# Patient Record
Sex: Male | Born: 1955 | Race: Black or African American | Hispanic: No | Smoking: Former smoker
Health system: Southern US, Community
[De-identification: ages and names within clinical notes are randomized; demographics above are authoritative.]

## PROBLEM LIST (undated history)

## (undated) DIAGNOSIS — N289 Disorder of kidney and ureter, unspecified: Secondary | ICD-10-CM

## (undated) DIAGNOSIS — N186 End stage renal disease: Secondary | ICD-10-CM

## (undated) DIAGNOSIS — I10 Essential (primary) hypertension: Secondary | ICD-10-CM

## (undated) DIAGNOSIS — E7800 Pure hypercholesterolemia, unspecified: Secondary | ICD-10-CM

## (undated) DIAGNOSIS — E119 Type 2 diabetes mellitus without complications: Secondary | ICD-10-CM

---

## 2021-11-26 ENCOUNTER — Encounter: Payer: Self-pay | Admitting: Emergency Medicine

## 2021-11-26 ENCOUNTER — Emergency Department: Payer: Medicare PPO

## 2021-11-26 ENCOUNTER — Other Ambulatory Visit: Payer: Self-pay

## 2021-11-26 ENCOUNTER — Inpatient Hospital Stay
Admission: EM | Admit: 2021-11-26 | Discharge: 2021-12-02 | DRG: 492 | Disposition: A | Discharge status: Skilled Nursing Facility | Payer: Medicare PPO | Attending: Internal Medicine | Admitting: Internal Medicine

## 2021-11-26 DIAGNOSIS — I5032 Chronic diastolic (congestive) heart failure: Secondary | ICD-10-CM

## 2021-11-26 DIAGNOSIS — Z79899 Other long term (current) drug therapy: Secondary | ICD-10-CM | POA: Diagnosis not present

## 2021-11-26 DIAGNOSIS — C61 Malignant neoplasm of prostate: Secondary | ICD-10-CM

## 2021-11-26 DIAGNOSIS — M21371 Foot drop, right foot: Secondary | ICD-10-CM | POA: Diagnosis present

## 2021-11-26 DIAGNOSIS — C7951 Secondary malignant neoplasm of bone: Secondary | ICD-10-CM | POA: Diagnosis present

## 2021-11-26 DIAGNOSIS — Z87891 Personal history of nicotine dependence: Secondary | ICD-10-CM

## 2021-11-26 DIAGNOSIS — Z20822 Contact with and (suspected) exposure to covid-19: Secondary | ICD-10-CM | POA: Diagnosis present

## 2021-11-26 DIAGNOSIS — E119 Type 2 diabetes mellitus without complications: Secondary | ICD-10-CM

## 2021-11-26 DIAGNOSIS — I10 Essential (primary) hypertension: Secondary | ICD-10-CM

## 2021-11-26 DIAGNOSIS — Z992 Dependence on renal dialysis: Secondary | ICD-10-CM | POA: Diagnosis not present

## 2021-11-26 DIAGNOSIS — Z794 Long term (current) use of insulin: Secondary | ICD-10-CM

## 2021-11-26 DIAGNOSIS — E876 Hypokalemia: Secondary | ICD-10-CM | POA: Diagnosis present

## 2021-11-26 DIAGNOSIS — S82143A Displaced bicondylar fracture of unspecified tibia, initial encounter for closed fracture: Secondary | ICD-10-CM | POA: Diagnosis present

## 2021-11-26 DIAGNOSIS — Z8249 Family history of ischemic heart disease and other diseases of the circulatory system: Secondary | ICD-10-CM

## 2021-11-26 DIAGNOSIS — Z8711 Personal history of peptic ulcer disease: Secondary | ICD-10-CM

## 2021-11-26 DIAGNOSIS — I5022 Chronic systolic (congestive) heart failure: Secondary | ICD-10-CM | POA: Diagnosis present

## 2021-11-26 DIAGNOSIS — I132 Hypertensive heart and chronic kidney disease with heart failure and with stage 5 chronic kidney disease, or end stage renal disease: Secondary | ICD-10-CM | POA: Diagnosis present

## 2021-11-26 DIAGNOSIS — N2889 Other specified disorders of kidney and ureter: Secondary | ICD-10-CM | POA: Diagnosis present

## 2021-11-26 DIAGNOSIS — Z951 Presence of aortocoronary bypass graft: Secondary | ICD-10-CM

## 2021-11-26 DIAGNOSIS — E1165 Type 2 diabetes mellitus with hyperglycemia: Secondary | ICD-10-CM | POA: Diagnosis present

## 2021-11-26 DIAGNOSIS — N2581 Secondary hyperparathyroidism of renal origin: Secondary | ICD-10-CM | POA: Diagnosis present

## 2021-11-26 DIAGNOSIS — D631 Anemia in chronic kidney disease: Secondary | ICD-10-CM | POA: Diagnosis present

## 2021-11-26 DIAGNOSIS — Y9289 Other specified places as the place of occurrence of the external cause: Secondary | ICD-10-CM | POA: Diagnosis not present

## 2021-11-26 DIAGNOSIS — W010XXA Fall on same level from slipping, tripping and stumbling without subsequent striking against object, initial encounter: Secondary | ICD-10-CM | POA: Diagnosis present

## 2021-11-26 DIAGNOSIS — S82141A Displaced bicondylar fracture of right tibia, initial encounter for closed fracture: Principal | ICD-10-CM

## 2021-11-26 DIAGNOSIS — C772 Secondary and unspecified malignant neoplasm of intra-abdominal lymph nodes: Secondary | ICD-10-CM | POA: Diagnosis present

## 2021-11-26 DIAGNOSIS — N186 End stage renal disease: Secondary | ICD-10-CM

## 2021-11-26 DIAGNOSIS — E7800 Pure hypercholesterolemia, unspecified: Secondary | ICD-10-CM | POA: Diagnosis present

## 2021-11-26 DIAGNOSIS — S82121A Displaced fracture of lateral condyle of right tibia, initial encounter for closed fracture: Secondary | ICD-10-CM | POA: Diagnosis present

## 2021-11-26 DIAGNOSIS — I2510 Atherosclerotic heart disease of native coronary artery without angina pectoris: Secondary | ICD-10-CM | POA: Diagnosis present

## 2021-11-26 DIAGNOSIS — E1122 Type 2 diabetes mellitus with diabetic chronic kidney disease: Secondary | ICD-10-CM | POA: Diagnosis present

## 2021-11-26 HISTORY — DX: Essential (primary) hypertension: I10

## 2021-11-26 HISTORY — DX: Type 2 diabetes mellitus without complications: E11.9

## 2021-11-26 HISTORY — DX: Pure hypercholesterolemia, unspecified: E78.00

## 2021-11-26 HISTORY — DX: End stage renal disease: N18.6

## 2021-11-26 HISTORY — DX: Disorder of kidney and ureter, unspecified: N28.9

## 2021-11-26 LAB — CBC WITH DIFFERENTIAL/PLATELET
Abs Immature Granulocytes: 0.01 10*3/uL (ref 0.00–0.07)
Basophils Absolute: 0 10*3/uL (ref 0.0–0.1)
Basophils Relative: 0 %
Eosinophils Absolute: 0 10*3/uL (ref 0.0–0.5)
Eosinophils Relative: 0 %
HCT: 38.9 % — ABNORMAL LOW (ref 39.0–52.0)
Hemoglobin: 12.2 g/dL — ABNORMAL LOW (ref 13.0–17.0)
Immature Granulocytes: 0 %
Lymphocytes Relative: 16 %
Lymphs Abs: 1 10*3/uL (ref 0.7–4.0)
MCH: 29.2 pg (ref 26.0–34.0)
MCHC: 31.4 g/dL (ref 30.0–36.0)
MCV: 93.1 fL (ref 80.0–100.0)
Monocytes Absolute: 0.3 10*3/uL (ref 0.1–1.0)
Monocytes Relative: 5 %
Neutro Abs: 5 10*3/uL (ref 1.7–7.7)
Neutrophils Relative %: 79 %
Platelets: 128 10*3/uL — ABNORMAL LOW (ref 150–400)
RBC: 4.18 MIL/uL — ABNORMAL LOW (ref 4.22–5.81)
RDW: 14.6 % (ref 11.5–15.5)
WBC: 6.4 10*3/uL (ref 4.0–10.5)
nRBC: 0 % (ref 0.0–0.2)

## 2021-11-26 LAB — COMPREHENSIVE METABOLIC PANEL
ALT: 19 U/L (ref 0–44)
AST: 22 U/L (ref 15–41)
Albumin: 3.4 g/dL — ABNORMAL LOW (ref 3.5–5.0)
Alkaline Phosphatase: 121 U/L (ref 38–126)
Anion gap: 11 (ref 5–15)
BUN: 20 mg/dL (ref 8–23)
CO2: 27 mmol/L (ref 22–32)
Calcium: 8.3 mg/dL — ABNORMAL LOW (ref 8.9–10.3)
Chloride: 101 mmol/L (ref 98–111)
Creatinine, Ser: 3.16 mg/dL — ABNORMAL HIGH (ref 0.61–1.24)
GFR, Estimated: 21 mL/min — ABNORMAL LOW (ref >60)
Glucose, Bld: 109 mg/dL — ABNORMAL HIGH (ref 70–99)
Potassium: 3.1 mmol/L — ABNORMAL LOW (ref 3.5–5.1)
Sodium: 139 mmol/L (ref 135–145)
Total Bilirubin: 0.5 mg/dL (ref 0.3–1.2)
Total Protein: 6.7 g/dL (ref 6.5–8.1)

## 2021-11-26 LAB — RESP PANEL BY RT-PCR (FLU A&B, COVID) ARPGX2
Influenza A by PCR: NEGATIVE
Influenza B by PCR: NEGATIVE
SARS Coronavirus 2 by RT PCR: NEGATIVE

## 2021-11-26 LAB — GLUCOSE, CAPILLARY: Glucose-Capillary: 117 mg/dL — ABNORMAL HIGH (ref 70–99)

## 2021-11-26 MED ORDER — SENNOSIDES-DOCUSATE SODIUM 8.6-50 MG PO TABS
1.0000 | ORAL_TABLET | Freq: Every day | ORAL | Status: DC
  Administered 2021-11-26 – 2021-12-01 (×6): 1 via ORAL
  Filled 2021-11-26 (×6): qty 1

## 2021-11-26 MED ORDER — TAMSULOSIN HCL 0.4 MG PO CAPS
0.4000 mg | ORAL_CAPSULE | Freq: Every day | ORAL | Status: DC
  Administered 2021-11-26 – 2021-12-01 (×6): 0.4 mg via ORAL
  Filled 2021-11-26 (×6): qty 1

## 2021-11-26 MED ORDER — ATORVASTATIN CALCIUM 20 MG PO TABS
80.0000 mg | ORAL_TABLET | Freq: Every day | ORAL | Status: DC
  Administered 2021-11-28 – 2021-12-02 (×5): 80 mg via ORAL
  Filled 2021-11-26 (×5): qty 4

## 2021-11-26 MED ORDER — AMLODIPINE BESYLATE 5 MG PO TABS
5.0000 mg | ORAL_TABLET | Freq: Every day | ORAL | Status: DC
  Administered 2021-11-26 – 2021-12-02 (×7): 5 mg via ORAL
  Filled 2021-11-26 (×7): qty 1

## 2021-11-26 MED ORDER — INSULIN ASPART 100 UNIT/ML IJ SOLN: 0.0000 [IU] | Freq: Every day | INTRAMUSCULAR | Status: DC

## 2021-11-26 MED ORDER — CALCIUM CARBONATE ANTACID 500 MG PO CHEW
500.0000 mg | CHEWABLE_TABLET | Freq: Two times a day (BID) | ORAL | Status: DC
  Administered 2021-11-26 – 2021-12-02 (×11): 500 mg via ORAL
  Filled 2021-11-26 (×11): qty 3

## 2021-11-26 MED ORDER — MORPHINE SULFATE (PF) 4 MG/ML IV SOLN: 4.0000 mg | Freq: Once | INTRAVENOUS | Status: DC

## 2021-11-26 MED ORDER — VITAMIN B-12 1000 MCG PO TABS
500.0000 ug | ORAL_TABLET | Freq: Every morning | ORAL | Status: DC
  Administered 2021-11-28 – 2021-12-02 (×5): 500 ug via ORAL
  Filled 2021-11-26 (×5): qty 1

## 2021-11-26 MED ORDER — PROCHLORPERAZINE EDISYLATE 10 MG/2ML IJ SOLN
10.0000 mg | Freq: Four times a day (QID) | INTRAMUSCULAR | Status: DC | PRN
  Filled 2021-11-26: qty 2

## 2021-11-26 MED ORDER — ADULT MULTIVITAMIN W/MINERALS CH
1.0000 | ORAL_TABLET | Freq: Every evening | ORAL | Status: DC
  Administered 2021-11-26 – 2021-12-01 (×5): 1 via ORAL
  Filled 2021-11-26 (×5): qty 1

## 2021-11-26 MED ORDER — HYDROMORPHONE HCL 1 MG/ML IJ SOLN
1.0000 mg | Freq: Once | INTRAMUSCULAR | Status: AC
  Administered 2021-11-26: 1 mg via INTRAVENOUS
  Filled 2021-11-26: qty 1

## 2021-11-26 MED ORDER — OXYCODONE HCL 5 MG PO TABS
5.0000 mg | ORAL_TABLET | ORAL | Status: DC | PRN
  Administered 2021-11-26 – 2021-12-02 (×14): 5 mg via ORAL
  Filled 2021-11-26 (×14): qty 1

## 2021-11-26 MED ORDER — PANTOPRAZOLE SODIUM 40 MG PO TBEC
40.0000 mg | DELAYED_RELEASE_TABLET | Freq: Two times a day (BID) | ORAL | Status: DC
  Administered 2021-11-26 – 2021-12-02 (×11): 40 mg via ORAL
  Filled 2021-11-26 (×11): qty 1

## 2021-11-26 MED ORDER — INSULIN ASPART 100 UNIT/ML IJ SOLN: 0.0000 [IU] | Freq: Three times a day (TID) | INTRAMUSCULAR | Status: DC

## 2021-11-26 MED ORDER — TRAZODONE HCL 50 MG PO TABS
50.0000 mg | ORAL_TABLET | Freq: Every day | ORAL | Status: DC
  Administered 2021-11-26 – 2021-12-01 (×6): 50 mg via ORAL
  Filled 2021-11-26 (×6): qty 1

## 2021-11-26 MED ORDER — HEPARIN SODIUM (PORCINE) 5000 UNIT/ML IJ SOLN: 5000.0000 [IU] | Freq: Three times a day (TID) | INTRAMUSCULAR | Status: DC

## 2021-11-26 MED ORDER — VITAMIN D (ERGOCALCIFEROL) 1.25 MG (50000 UNIT) PO CAPS
50000.0000 [IU] | ORAL_CAPSULE | ORAL | Status: DC
  Filled 2021-11-26: qty 1

## 2021-11-26 MED ORDER — CEFAZOLIN SODIUM-DEXTROSE 2-4 GM/100ML-% IV SOLN
2.0000 g | INTRAVENOUS | Status: AC
Start: 2021-11-27 — End: 2021-11-27
  Administered 2021-11-27: 2 g via INTRAVENOUS
  Filled 2021-11-26: qty 100

## 2021-11-26 MED ORDER — OXYBUTYNIN CHLORIDE 5 MG PO TABS
5.0000 mg | ORAL_TABLET | Freq: Three times a day (TID) | ORAL | Status: DC
  Administered 2021-11-27 – 2021-12-02 (×14): 5 mg via ORAL
  Filled 2021-11-26 (×20): qty 1

## 2021-11-26 MED ORDER — CARVEDILOL 25 MG PO TABS
25.0000 mg | ORAL_TABLET | Freq: Two times a day (BID) | ORAL | Status: DC
  Administered 2021-11-27 – 2021-12-02 (×9): 25 mg via ORAL
  Filled 2021-11-26 (×9): qty 1

## 2021-11-26 MED ORDER — MELATONIN 5 MG PO TABS
5.0000 mg | ORAL_TABLET | Freq: Every evening | ORAL | Status: DC | PRN
  Administered 2021-11-26 – 2021-11-28 (×2): 5 mg via ORAL
  Filled 2021-11-26 (×3): qty 1

## 2021-11-26 MED ORDER — HYDROMORPHONE HCL 1 MG/ML IJ SOLN
1.0000 mg | INTRAMUSCULAR | Status: DC | PRN
  Administered 2021-11-27 (×4): 1 mg via INTRAVENOUS
  Filled 2021-11-26 (×5): qty 1

## 2021-11-26 MED ORDER — PREDNISONE 10 MG PO TABS
10.0000 mg | ORAL_TABLET | Freq: Every day | ORAL | Status: DC
  Administered 2021-11-28 – 2021-12-02 (×5): 10 mg via ORAL
  Filled 2021-11-26 (×5): qty 1

## 2021-11-26 MED ORDER — ACETAMINOPHEN 325 MG PO TABS
650.0000 mg | ORAL_TABLET | Freq: Once | ORAL | Status: AC
Start: 2021-11-26 — End: 2021-11-26
  Administered 2021-11-26: 650 mg via ORAL
  Filled 2021-11-26: qty 2

## 2021-11-26 MED ORDER — GABAPENTIN 100 MG PO CAPS
100.0000 mg | ORAL_CAPSULE | Freq: Every day | ORAL | Status: DC
  Administered 2021-11-26 – 2021-12-02 (×6): 100 mg via ORAL
  Filled 2021-11-26 (×7): qty 1

## 2021-11-26 MED ORDER — FOLIC ACID 1 MG PO TABS
1.0000 mg | ORAL_TABLET | Freq: Every day | ORAL | Status: DC
  Administered 2021-11-28 – 2021-12-02 (×5): 1 mg via ORAL
  Filled 2021-11-26 (×5): qty 1

## 2021-11-26 MED ORDER — ACETAMINOPHEN 325 MG PO TABS
650.0000 mg | ORAL_TABLET | Freq: Four times a day (QID) | ORAL | Status: DC | PRN
  Administered 2021-11-27 – 2021-11-30 (×4): 650 mg via ORAL
  Filled 2021-11-26 (×4): qty 2

## 2021-11-26 MED ORDER — TORSEMIDE 20 MG PO TABS
20.0000 mg | ORAL_TABLET | Freq: Two times a day (BID) | ORAL | Status: DC
  Administered 2021-11-28 – 2021-12-02 (×9): 20 mg via ORAL
  Filled 2021-11-26 (×9): qty 1

## 2021-11-26 MED ORDER — POLYETHYLENE GLYCOL 3350 17 G PO PACK
17.0000 g | PACK | Freq: Every day | ORAL | Status: DC | PRN
  Administered 2021-12-01: 17 g via ORAL
  Filled 2021-11-26: qty 1

## 2021-11-26 NOTE — Progress Notes (Signed)
Patient's health care power of attorney is his daughter Micheal Aguirre. She said patient has a history of dementia. Th daughter would like to speak to an ortho surgeon before proceeding with the surgery tomorrow. Dr. Mack Guise aware.  ? ?[name] ?[phone] ?([place]) ?

## 2021-11-26 NOTE — ED Notes (Signed)
See triage note  presents s/p fall  having pain to right knee  ?

## 2021-11-26 NOTE — ED Triage Notes (Signed)
Pt to ED via [facility] from [facility]. Pt was at dialysis and had a mechanical fall going to the bathroom. Pt reports right leg pain and swelling. Pt denies CP or SOB. Pt denies LOC or head trauma.  ?

## 2021-11-26 NOTE — H&P (Addendum)
?History and Physical ? ?Micheal Aguirre QPR:[id number] DOB: [birth year] DOA: 11/26/2021 ? ?Referring physician:  Dr. Starleen Blue, [place] ?PCP: Pcp, No  ?Outpatient Specialists: Nephrology, oncology. ?Patient coming from: SNF through dialysis center. ? ?Chief Complaint: R knee pain  ? ?HPI: Micheal Aguirre is a 66 y.o. male with medical history significant for ESRD on HD MWF, essential hypertension, type 2 diabetes, hyperlipidemia, coronary artery disease status post CABG, HFrEF 35%, duodenal ulcers, history of GI bleed, prostate cancer, with metastasis to bone, left renal mass, who presented to [facility] ED via EMS after a mechanical fall at the hemodialysis center today.  Immediately after his fall he incurred right knee pain with significant edema to his right knee.  No head trauma.  He was in his usual state of health prior to this.  Upon presentation to the ED, patient was noted to have significant pain and edema to his right knee.  Right knee x-ray revealed right lateral tibial plateau fracture and joint effusion.  CT scan confirmed the same.  EDP discussed the case with orthopedic surgery Dr. Mack Guise who recommended admission by hospitalist service and possible orthopedic surgical repair on 11/27/2021.  Patient was admitted by the hospitalist service, [facility]. ? ?ED Course: Tmax 98.2.  BP 184/99, pulse 73, respiratory 20, saturation 98% on room air.  Labs studies remarkable for serum potassium 3.1, glucose 109, creatinine 3.16, calcium 8.3, albumin 3.4, GFR 21.  Presenting hemoglobin 12.2 K, platelet count 1 28,000. ? ?Review of Systems: ?Review of systems as noted in the HPI. All other systems reviewed and are negative. ? ? ?Past Medical History:  ?Diagnosis Date  ? Diabetes mellitus without complication ([place])   ? ESRD (end stage renal disease) ([place])   ? Hypercholesteremia   ? Hypertension   ? Renal disorder   ? ?Surgical history: ?Cardiac surgery ?Ureter surgery. ? ?Social History:  reports that he has an unknown smoking status. He has  never used smokeless tobacco. He reports that he does not currently use alcohol. He reports that he does not use drugs. ? ? ?Family history: ?Mother with history of myocardial infarction in her late 64s. ? ?Prior to Admission medications   ?Medication Sig Start Date End Date Taking? Authorizing Provider  ?amLODipine (NORVASC) 5 MG tablet Take 5 mg by mouth daily.   Yes [provider]  ?atorvastatin (LIPITOR) 80 MG tablet Take 80 mg by mouth daily.   Yes [provider]  ?carvedilol (COREG) 25 MG tablet Take 25 mg by mouth 2 (two) times daily with a meal.   Yes [provider]  ?gabapentin (NEURONTIN) 100 MG capsule Take 100 mg by mouth daily.   Yes [provider]  ?insulin lispro (HUMALOG) 100 UNIT/ML injection Inject into the skin 3 (three) times daily before meals.   Yes [provider]  ?oxybutynin (DITROPAN) 5 MG tablet Take 5 mg by mouth 3 (three) times daily.   Yes [provider]  ?traZODone (DESYREL) 50 MG tablet Take 50 mg by mouth at bedtime.   Yes [provider]  ? ? ?Physical Exam: ?BP (!) 184/99   Pulse 73   Temp 98.2 ?F (36.8 ?C) (Oral)   Resp 20   Ht '5\' 9"'$  (1.753 m)   Wt 81.6 kg   SpO2 98%   BMI 26.58 kg/m?  ? ?General: 66 y.o. year-old male well developed well nourished in no acute distress.  Alert and interactive. ?Cardiovascular: Regular rate and rhythm with no rubs or gallops.  No thyromegaly or  JVD noted.  Trace lower extremity edema. 2/4 pulses in all 4 extremities. ?Respiratory: Clear to auscultation with no wheezes or rales. Good inspiratory effort. ?Abdomen: Soft nontender nondistended with normal bowel sounds x4 quadrants. ?Muskuloskeletal: No cyanosis or clubbing.  Trace edema noted bilaterally.  Joint effusion noted from right knee. ?Neuro: CN II-XII intact, strength, sensation, reflexes ?Skin: No ulcerative lesions noted or rashes ?Psychiatry: Judgement and insight appear normal. Mood is appropriate for condition and  setting ?   ?   ?   ?Labs on Admission:  ?Basic Metabolic Panel: ?Recent Labs  ?Lab 11/26/21 ?3267  ?NA 139  ?K 3.1*  ?CL 101  ?CO2 27  ?GLUCOSE 109*  ?BUN 20  ?CREATININE 3.16*  ?CALCIUM 8.3*  ? ?Liver Function Tests: ?Recent Labs  ?Lab 11/26/21 ?1245  ?AST 22  ?ALT 19  ?ALKPHOS 121  ?BILITOT 0.5  ?PROT 6.7  ?ALBUMIN 3.4*  ? ?No results for input(s): LIPASE, AMYLASE in the last 168 hours. ?No results for input(s): AMMONIA in the last 168 hours. ?CBC: ?Recent Labs  ?Lab 11/26/21 ?8099  ?WBC 6.4  ?NEUTROABS 5.0  ?HGB 12.2*  ?HCT 38.9*  ?MCV 93.1  ?PLT 128*  ? ?Cardiac Enzymes: ?No results for input(s): CKTOTAL, CKMB, CKMBINDEX, TROPONINI in the last 168 hours. ? ?BNP (last 3 results) ?No results for input(s): BNP in the last 8760 hours. ? ?ProBNP (last 3 results) ?No results for input(s): PROBNP in the last 8760 hours. ? ?CBG: ?No results for input(s): GLUCAP in the last 168 hours. ? ?Radiological Exams on Admission: ?DG Ankle Complete Right ? ?Result Date: 11/26/2021 ?CLINICAL DATA:  Trauma, pain EXAM: RIGHT ANKLE - COMPLETE 3+ VIEW COMPARISON:  None. FINDINGS: No recent fracture or dislocation is seen. Osteopenia is seen in bony structures. Small bony spurs seen in the distal tibia. Bony spurs are noted in the dorsal aspect of intertarsal and tarsometatarsal joints in the lateral view. There is possible tiny plantar spur in the calcaneus. There is soft tissue swelling around the ankle, more so over the lateral malleolus. IMPRESSION: No recent fracture or dislocation is seen in the right ankle. Degenerative changes are noted in multiple joints as described in the body of the report. There is possible small plantar spur in the calcaneus. Electronically Signed   By: Elmer Picker M.D.   On: 11/26/2021 17:59  ? ?CT Cervical Spine Wo Contrast ? ?Result Date: 11/26/2021 ?CLINICAL DATA:  Neck trauma EXAM: CT CERVICAL SPINE WITHOUT CONTRAST TECHNIQUE: Multidetector CT imaging of the cervical spine was performed without  intravenous contrast. Multiplanar CT image reconstructions were also generated. RADIATION DOSE REDUCTION: This exam was performed according to the departmental dose-optimization program which includes automated exposure control, adjustment of the mA and/or kV according to patient size and/or use of iterative reconstruction technique. COMPARISON:  None. FINDINGS: Alignment: Straightening and mild reversal of the normal cervical lordosis, with trace anterolisthesis of C4 on C5 and trace retrolisthesis of C5 on C6, which appears degenerative. Skull base and vertebrae: No acute fracture. No primary bone lesion or focal pathologic process. Osteopenia. Soft tissues and spinal canal: No prevertebral fluid or swelling. No visible canal hematoma. Vascular calcifications. Disc levels: Multilevel degenerative changes, with mild spinal canal stenosis at C4-C5 and C5-C6. Multilevel uncovertebral and facet arthropathy, which causes severe left neural foraminal narrowing at C4-C5 and moderate to severe neural foraminal narrowing on the right at C5-C6. Upper chest: No focal pulmonary opacity or pleural effusion. Other: None. IMPRESSION: No acute fracture or traumatic listhesis  in the cervical spine. Electronically Signed   By: Merilyn Baba M.D.   On: 11/26/2021 19:10  ? ?CT Knee Right Wo Contrast ? ?Result Date: 11/26/2021 ?CLINICAL DATA:  Knee trauma.  Tibial plateau fracture. EXAM: CT OF THE RIGHT KNEE WITHOUT CONTRAST 3-DIMENSIONAL CT IMAGE RENDERING ON ACQUISITION WORKSTATION TECHNIQUE: Multidetector CT imaging of the right knee was performed according to the standard protocol. Multiplanar CT image reconstructions were also generated. 3-dimensional CT images were rendered by post-processing of the original CT data on an acquisition workstation. The 3-dimensional CT images were interpreted and findings were reported in the accompanying complete CT report for this study RADIATION DOSE REDUCTION: This exam was performed according  to the departmental dose-optimization program which includes automated exposure control, adjustment of the mA and/or kV according to patient size and/or use of iterative reconstruction technique. COMPARISON:  R

## 2021-11-26 NOTE — ED Provider Notes (Addendum)
? ?[facility] ?Provider Note ? ? ? Event Date/Time  ? First MD Initiated Contact with Patient 11/26/21 1627   ?  (approximate) ? ? ?History  ? ?Fall ? ? ?HPI ? ?Micheal Aguirre is a 66 y.o. male with past medical history of end-stage renal disease who gets dialysis Monday, Wednesday and Friday, metastatic prostate cancer coronary disease status post CABG, heart failure with reduced ejection fraction history of peptic ulcer disease and GI bleeding who presents with right knee pain.  Patient was at dialysis when he stepped wrong twisting the leg and falling to the ground to his buttock.  Did not hit his head.  Denies loss of consciousness.  Patient has had significant right knee pain and swelling since.  Unable to ambulate.    ? ?Past Medical History:  ?Diagnosis Date  ? ESRD (end stage renal disease) ([place])   ? Renal disorder   ? ? ?There are no problems to display for this patient. ? ? ? ?Physical Exam  ?Triage Vital Signs: ?ED Triage Vitals  ?Enc Vitals Group  ?   BP 11/26/21 1615 (!) 186/116  ?   Pulse Rate 11/26/21 1615 65  ?   Resp 11/26/21 1615 18  ?   Temp 11/26/21 1615 97.9 ?F (36.6 ?C)  ?   Temp Source 11/26/21 1615 Oral  ?   SpO2 11/26/21 1615 100 %  ?   Weight 11/26/21 1610 180 lb (81.6 kg)  ?   Height 11/26/21 1610 '5\' 9"'$  (1.753 m)  ?   Head Circumference --   ?   Peak Flow --   ?   Pain Score 11/26/21 1535 8  ?   Pain Loc --   ?   Pain Edu? --   ?   Excl. in [place]? --   ? ? ?Most recent vital signs: ?Vitals:  ? 11/26/21 1615  ?BP: (!) 186/116  ?Pulse: 65  ?Resp: 18  ?Temp: 97.9 ?F (36.6 ?C)  ?SpO2: 100%  ? ? ? ?General: Awake, no distress.  ?CV:  Good peripheral perfusion.  ?Resp:  Normal effort.  ?Abd:  No distention.  ?Neuro:             Awake, Alert, Oriented x 3  ?Other:  Right knee is significantly swollen, held in flexion, unable to fully straighten, mild edema of the lower calf but compartments are soft ?2+ DP pulses bilaterally ?Able to move toes but strength testing otherwise limited  secondary to pain ?Sensation grossly intact bilateral lower extremities ? ? ? ?ED Results / Procedures / Treatments  ?Labs ?(all labs ordered are listed, but only abnormal results are displayed) ?Labs Reviewed  ?RESP PANEL BY RT-PCR (FLU A&B, COVID) [id number]  ?CBC WITH DIFFERENTIAL/PLATELET  ?COMPREHENSIVE METABOLIC PANEL  ? ? ? ?EKG ? ? ? ? ?RADIOLOGY ?Reviewed the x-ray of the right knee which shows a depressed lateral tibial plateau fracture ? ? ?PROCEDURES: ? ?Critical Care performed: No ? ?Procedures ? ? ?MEDICATIONS ORDERED IN ED: ?Medications  ?HYDROmorphone (DILAUDID) injection 1 mg (has no administration in time range)  ?acetaminophen (TYLENOL) tablet 650 mg (650 mg Oral Given 11/26/21 1614)  ? ? ? ?IMPRESSION / MDM / ASSESSMENT AND PLAN / ED COURSE  ?I reviewed the triage vital signs and the nursing notes. ?             ?               ? ?Patient is a 66 year old male  with multiple comorbidities presents with knee pain and swelling after a fall today.  Was at dialysis when he seemed to stepped wrong falling onto his buttock and twisting the right leg.  Has had significant right knee pain and swelling since.  On exam he has significant swelling but the compartments are soft he is neurovascularly intact with good pulses.  Does have some tenderness over the ankle as well as will obtain an x-ray of this.  X-ray of the right knee shows a depressed lateral tibial plateau fracture.  We will obtain a CT.  Discussed with Dr. Mack Guise with orthopedics, will touch base after CT is completed.  Patient in significant pain at this time we will treat with IV Dilaudid check labs as he will need admission.  ? ?Labs overall reassuring.  Mildly hypokalemic at 3.1 but given he has end-stage renal disease will not supplement.  CT performed and reviewed by Dr. Mack Guise is okay with the patient staying at [facility] but would like him to be admitted to the hospital service given all of his medical comorbidities.  On reassessment  patient continues to have significant pain, good sensory and pulse exam but is having difficulty dorsiflexing the foot.  Unclear if this is secondary to pain.  Will discuss with the hospitalist for admission. ? ? ?FINAL CLINICAL IMPRESSION(S) / ED DIAGNOSES  ? ?Final diagnoses:  ?Closed fracture of right tibial plateau, initial encounter  ? ? ? ?Rx / DC Orders  ? ?ED Discharge Orders   ? ? None  ? ?  ? ? ? ?Note:  This document was prepared using Dragon voice recognition software and may include unintentional dictation errors. ?  ?Rada Hay, MD ?11/26/21 1730 ? ?  ?Rada Hay, MD ?11/26/21 1919 ? ?

## 2021-11-26 NOTE — ED Provider Triage Note (Signed)
Emergency Medicine Provider Triage Evaluation Note ? ?Micheal Aguirre , a 66 y.o. male  was evaluated in triage.  Pt complains of right knee pain.  Patient states that he slipped and fell at dialysis landed on his knee.  Patient is keeping the leg and flexion at this time.  No other injury or complaint.  Did not hit his head or lose consciousness..  Patient fell at dialysis but was able to complete his treatment. ? ?Review of Systems  ?Positive: Right knee pain/injury ?Negative: Head injury, chest pain, shortness of breath ? ?Physical Exam  ?There were no vitals taken for this visit. ?Gen:   Awake, no distress   ?Resp:  Normal effort  ?MSK:   Unable to extend right leg at this time.  Tender globally along the anterior knee. ?Other:   ? ?Medical Decision Making  ?Medically screening exam initiated at 3:42 PM.  Appropriate orders placed.  [name] was informed that the remainder of the evaluation will be completed by another provider, this initial triage assessment does not replace that evaluation, and the importance of remaining in the ED until their evaluation is complete. ? ?Patient fell, landed on his knee.  Complaining of knee pain.  X-ray ordered. ?  ?[name], PA-C ?11/26/21 1544 ? ?

## 2021-11-26 NOTE — ED Triage Notes (Addendum)
BIB [facility].  Mechanical fall in [place] at dialysis.  Right knee swelling appreciated by EMS per report.  Dialysis treatment completed.  VS wnl ? ?50 mcg fentanyl given IM by EMS ?

## 2021-11-27 ENCOUNTER — Inpatient Hospital Stay: Payer: Medicare PPO

## 2021-11-27 ENCOUNTER — Other Ambulatory Visit: Payer: Self-pay

## 2021-11-27 ENCOUNTER — Inpatient Hospital Stay: Payer: Medicare PPO | Admitting: Anesthesiology

## 2021-11-27 ENCOUNTER — Encounter
Admission: EM | Disposition: A | Discharge status: Skilled Nursing Facility | Payer: Self-pay | Source: Home / Self Care | Attending: Internal Medicine

## 2021-11-27 HISTORY — PX: ORIF TIBIA FRACTURE: SHX5416

## 2021-11-27 LAB — CBC
HCT: 38.4 % — ABNORMAL LOW (ref 39.0–52.0)
Hemoglobin: 12.1 g/dL — ABNORMAL LOW (ref 13.0–17.0)
MCH: 28.9 pg (ref 26.0–34.0)
MCHC: 31.5 g/dL (ref 30.0–36.0)
MCV: 91.6 fL (ref 80.0–100.0)
Platelets: 127 10*3/uL — ABNORMAL LOW (ref 150–400)
RBC: 4.19 MIL/uL — ABNORMAL LOW (ref 4.22–5.81)
RDW: 14.7 % (ref 11.5–15.5)
WBC: 6.1 10*3/uL (ref 4.0–10.5)
nRBC: 0 % (ref 0.0–0.2)

## 2021-11-27 LAB — COMPREHENSIVE METABOLIC PANEL
ALT: 18 U/L (ref 0–44)
AST: 20 U/L (ref 15–41)
Albumin: 3.4 g/dL — ABNORMAL LOW (ref 3.5–5.0)
Alkaline Phosphatase: 116 U/L (ref 38–126)
Anion gap: 10 (ref 5–15)
BUN: 26 mg/dL — ABNORMAL HIGH (ref 8–23)
CO2: 26 mmol/L (ref 22–32)
Calcium: 8.2 mg/dL — ABNORMAL LOW (ref 8.9–10.3)
Chloride: 102 mmol/L (ref 98–111)
Creatinine, Ser: 3.91 mg/dL — ABNORMAL HIGH (ref 0.61–1.24)
GFR, Estimated: 16 mL/min — ABNORMAL LOW (ref >60)
Glucose, Bld: 113 mg/dL — ABNORMAL HIGH (ref 70–99)
Potassium: 2.8 mmol/L — ABNORMAL LOW (ref 3.5–5.1)
Sodium: 138 mmol/L (ref 135–145)
Total Bilirubin: 0.6 mg/dL (ref 0.3–1.2)
Total Protein: 7 g/dL (ref 6.5–8.1)

## 2021-11-27 LAB — BASIC METABOLIC PANEL
Anion gap: 10 (ref 5–15)
BUN: 31 mg/dL — ABNORMAL HIGH (ref 8–23)
CO2: 26 mmol/L (ref 22–32)
Calcium: 8.4 mg/dL — ABNORMAL LOW (ref 8.9–10.3)
Chloride: 105 mmol/L (ref 98–111)
Creatinine, Ser: 4.1 mg/dL — ABNORMAL HIGH (ref 0.61–1.24)
GFR, Estimated: 15 mL/min — ABNORMAL LOW (ref >60)
Glucose, Bld: 123 mg/dL — ABNORMAL HIGH (ref 70–99)
Potassium: 3.2 mmol/L — ABNORMAL LOW (ref 3.5–5.1)
Sodium: 141 mmol/L (ref 135–145)

## 2021-11-27 LAB — URINALYSIS, COMPLETE (UACMP) WITH MICROSCOPIC
Bilirubin Urine: NEGATIVE
Glucose, UA: NEGATIVE mg/dL
Ketones, ur: NEGATIVE mg/dL
Leukocytes,Ua: NEGATIVE
Nitrite: NEGATIVE
Protein, ur: 100 mg/dL — AB
Specific Gravity, Urine: 1.017 (ref 1.005–1.030)
Squamous Epithelial / HPF: NONE SEEN (ref 0–5)
pH: 5 (ref 5.0–8.0)

## 2021-11-27 LAB — HEMOGLOBIN A1C
Hgb A1c MFr Bld: 5.6 % (ref 4.8–5.6)
Mean Plasma Glucose: 114.02 mg/dL

## 2021-11-27 LAB — PROTIME-INR
INR: 1.2 (ref 0.8–1.2)
Prothrombin Time: 14.6 seconds (ref 11.4–15.2)

## 2021-11-27 LAB — TYPE AND SCREEN
ABO/RH(D): O POS
Antibody Screen: NEGATIVE

## 2021-11-27 LAB — SURGICAL PCR SCREEN
MRSA, PCR: NEGATIVE
Staphylococcus aureus: NEGATIVE

## 2021-11-27 LAB — MAGNESIUM: Magnesium: 1.9 mg/dL (ref 1.7–2.4)

## 2021-11-27 LAB — GLUCOSE, CAPILLARY
Glucose-Capillary: 114 mg/dL — ABNORMAL HIGH (ref 70–99)
Glucose-Capillary: 111 mg/dL — ABNORMAL HIGH (ref 70–99)
Glucose-Capillary: 100 mg/dL — ABNORMAL HIGH (ref 70–99)
Glucose-Capillary: 123 mg/dL — ABNORMAL HIGH (ref 70–99)

## 2021-11-27 LAB — HIV ANTIBODY (ROUTINE TESTING W REFLEX): HIV Screen 4th Generation wRfx: NONREACTIVE

## 2021-11-27 LAB — PHOSPHORUS: Phosphorus: 4 mg/dL (ref 2.5–4.6)

## 2021-11-27 SURGERY — OPEN REDUCTION INTERNAL FIXATION (ORIF) TIBIA FRACTURE
Anesthesia: General | Site: Leg Lower | Laterality: Right

## 2021-11-27 MED ORDER — CEFAZOLIN SODIUM-DEXTROSE 2-4 GM/100ML-% IV SOLN
INTRAVENOUS | Status: AC
  Administered 2021-11-27: 2 g via INTRAVENOUS
  Filled 2021-11-27: qty 100

## 2021-11-27 MED ORDER — BISACODYL 10 MG RE SUPP: 10.0000 mg | Freq: Every day | RECTAL | Status: DC | PRN

## 2021-11-27 MED ORDER — PHENYLEPHRINE HCL (PRESSORS) 10 MG/ML IV SOLN
INTRAVENOUS | Status: AC
  Filled 2021-11-27: qty 1

## 2021-11-27 MED ORDER — FENTANYL CITRATE (PF) 100 MCG/2ML IJ SOLN
INTRAMUSCULAR | Status: AC
  Filled 2021-11-27: qty 2

## 2021-11-27 MED ORDER — HEPARIN SODIUM (PORCINE) 5000 UNIT/ML IJ SOLN
5000.0000 [IU] | Freq: Three times a day (TID) | INTRAMUSCULAR | Status: DC
  Administered 2021-11-28 – 2021-11-29 (×5): 5000 [IU] via SUBCUTANEOUS
  Filled 2021-11-27 (×5): qty 1

## 2021-11-27 MED ORDER — EPHEDRINE 5 MG/ML INJ
INTRAVENOUS | Status: AC
  Filled 2021-11-27: qty 5

## 2021-11-27 MED ORDER — POTASSIUM CHLORIDE CRYS ER 20 MEQ PO TBCR: 40.0000 meq | EXTENDED_RELEASE_TABLET | Freq: Once | ORAL | Status: DC

## 2021-11-27 MED ORDER — LIDOCAINE HCL (CARDIAC) PF 100 MG/5ML IV SOSY
PREFILLED_SYRINGE | INTRAVENOUS | Status: DC | PRN
  Administered 2021-11-27: 80 mg via INTRAVENOUS

## 2021-11-27 MED ORDER — CEFAZOLIN SODIUM-DEXTROSE 2-4 GM/100ML-% IV SOLN
2.0000 g | Freq: Four times a day (QID) | INTRAVENOUS | Status: AC
  Administered 2021-11-28: 2 g via INTRAVENOUS
  Filled 2021-11-27 (×2): qty 100

## 2021-11-27 MED ORDER — FENTANYL CITRATE (PF) 100 MCG/2ML IJ SOLN: 25.0000 ug | INTRAMUSCULAR | Status: DC | PRN

## 2021-11-27 MED ORDER — HYDROMORPHONE HCL 2 MG PO TABS: 2.0000 mg | ORAL_TABLET | ORAL | Status: DC | PRN

## 2021-11-27 MED ORDER — FENTANYL CITRATE (PF) 100 MCG/2ML IJ SOLN
INTRAMUSCULAR | Status: DC | PRN
  Administered 2021-11-27 (×2): 50 ug via INTRAVENOUS

## 2021-11-27 MED ORDER — ONDANSETRON HCL 4 MG/2ML IJ SOLN
INTRAMUSCULAR | Status: DC | PRN
  Administered 2021-11-27: 4 mg via INTRAVENOUS

## 2021-11-27 MED ORDER — HYDROMORPHONE HCL 2 MG PO TABS
1.0000 mg | ORAL_TABLET | ORAL | Status: DC | PRN
  Administered 2021-11-27: 2 mg via ORAL
  Filled 2021-11-27: qty 1

## 2021-11-27 MED ORDER — POTASSIUM CHLORIDE 10 MEQ/100ML IV SOLN
10.0000 meq | INTRAVENOUS | Status: AC
  Administered 2021-11-27 (×3): 10 meq via INTRAVENOUS

## 2021-11-27 MED ORDER — HYDROMORPHONE HCL 1 MG/ML IJ SOLN
0.5000 mg | INTRAMUSCULAR | Status: DC | PRN
  Administered 2021-12-01 – 2021-12-02 (×4): 1 mg via INTRAVENOUS
  Filled 2021-11-27 (×4): qty 1

## 2021-11-27 MED ORDER — OXYCODONE HCL 5 MG PO TABS
5.0000 mg | ORAL_TABLET | ORAL | Status: DC | PRN
  Filled 2021-11-27: qty 2

## 2021-11-27 MED ORDER — CALCIUM GLUCONATE-NACL 2-0.675 GM/100ML-% IV SOLN
2.0000 g | Freq: Once | INTRAVENOUS | Status: AC
  Administered 2021-11-27: 2000 mg via INTRAVENOUS
  Filled 2021-11-27: qty 100

## 2021-11-27 MED ORDER — PROPOFOL 10 MG/ML IV BOLUS
INTRAVENOUS | Status: AC
  Filled 2021-11-27: qty 20

## 2021-11-27 MED ORDER — CALCIUM GLUCONATE-NACL 2-0.675 GM/100ML-% IV SOLN
2.0000 g | Freq: Once | INTRAVENOUS | Status: DC
  Administered 2021-11-27: 2 g via INTRAVENOUS
  Filled 2021-11-27: qty 100

## 2021-11-27 MED ORDER — POTASSIUM CHLORIDE 10 MEQ/100ML IV SOLN
10.0000 meq | INTRAVENOUS | Status: DC
  Filled 2021-11-27: qty 100

## 2021-11-27 MED ORDER — 0.9 % SODIUM CHLORIDE (POUR BTL) OPTIME
TOPICAL | Status: DC | PRN
  Administered 2021-11-27: 1000 mL

## 2021-11-27 MED ORDER — LIDOCAINE HCL (PF) 2 % IJ SOLN
INTRAMUSCULAR | Status: AC
  Filled 2021-11-27: qty 5

## 2021-11-27 MED ORDER — LACTATED RINGERS IV SOLN: INTRAVENOUS | Status: DC | PRN

## 2021-11-27 MED ORDER — OXYCODONE HCL 5 MG/5ML PO SOLN: 5.0000 mg | Freq: Once | ORAL | Status: DC | PRN

## 2021-11-27 MED ORDER — PHENOL 1.4 % MT LIQD: 1.0000 | OROMUCOSAL | Status: DC | PRN

## 2021-11-27 MED ORDER — POTASSIUM CHLORIDE CRYS ER 20 MEQ PO TBCR
40.0000 meq | EXTENDED_RELEASE_TABLET | Freq: Once | ORAL | Status: AC
  Administered 2021-11-27: 40 meq via ORAL
  Filled 2021-11-27: qty 2

## 2021-11-27 MED ORDER — ONDANSETRON HCL 4 MG/2ML IJ SOLN
INTRAMUSCULAR | Status: AC
  Filled 2021-11-27: qty 2

## 2021-11-27 MED ORDER — METHOCARBAMOL 500 MG PO TABS
500.0000 mg | ORAL_TABLET | Freq: Four times a day (QID) | ORAL | Status: DC | PRN
  Administered 2021-11-28 – 2021-12-02 (×5): 500 mg via ORAL
  Filled 2021-11-27 (×5): qty 1

## 2021-11-27 MED ORDER — TRAMADOL HCL 50 MG PO TABS
50.0000 mg | ORAL_TABLET | Freq: Four times a day (QID) | ORAL | Status: DC
  Administered 2021-11-27 – 2021-11-28 (×4): 50 mg via ORAL
  Filled 2021-11-27 (×4): qty 1

## 2021-11-27 MED ORDER — DOCUSATE SODIUM 100 MG PO CAPS
100.0000 mg | ORAL_CAPSULE | Freq: Two times a day (BID) | ORAL | Status: DC
  Administered 2021-11-27 – 2021-12-02 (×10): 100 mg via ORAL
  Filled 2021-11-27 (×10): qty 1

## 2021-11-27 MED ORDER — ACETAMINOPHEN 500 MG PO TABS
1000.0000 mg | ORAL_TABLET | Freq: Four times a day (QID) | ORAL | Status: AC
  Administered 2021-11-27 – 2021-11-28 (×3): 1000 mg via ORAL
  Filled 2021-11-27 (×3): qty 2

## 2021-11-27 MED ORDER — ONDANSETRON HCL 4 MG/2ML IJ SOLN: 4.0000 mg | Freq: Four times a day (QID) | INTRAMUSCULAR | Status: DC | PRN

## 2021-11-27 MED ORDER — ALUM & MAG HYDROXIDE-SIMETH 200-200-20 MG/5ML PO SUSP
30.0000 mL | ORAL | Status: DC | PRN
  Administered 2021-12-02: 30 mL via ORAL
  Filled 2021-11-27: qty 30

## 2021-11-27 MED ORDER — ONDANSETRON HCL 4 MG PO TABS: 4.0000 mg | ORAL_TABLET | Freq: Four times a day (QID) | ORAL | Status: DC | PRN

## 2021-11-27 MED ORDER — PHENYLEPHRINE HCL-NACL 20-0.9 MG/250ML-% IV SOLN
INTRAVENOUS | Status: AC
  Filled 2021-11-27: qty 250

## 2021-11-27 MED ORDER — OXYCODONE HCL 5 MG PO TABS
10.0000 mg | ORAL_TABLET | ORAL | Status: DC | PRN
  Administered 2021-11-27: 10 mg via ORAL
  Administered 2021-11-28: 15 mg via ORAL
  Filled 2021-11-27: qty 3

## 2021-11-27 MED ORDER — MENTHOL 3 MG MT LOZG
1.0000 | LOZENGE | OROMUCOSAL | Status: DC | PRN
  Filled 2021-11-27: qty 9

## 2021-11-27 MED ORDER — NEOMYCIN-POLYMYXIN B GU 40-200000 IR SOLN
Status: DC | PRN
  Administered 2021-11-27: 4 mL

## 2021-11-27 MED ORDER — ACETAMINOPHEN 10 MG/ML IV SOLN
INTRAVENOUS | Status: AC
Start: 2021-11-27 — End: ?
  Filled 2021-11-27: qty 100

## 2021-11-27 MED ORDER — ACETAMINOPHEN 10 MG/ML IV SOLN
INTRAVENOUS | Status: DC | PRN
  Administered 2021-11-27: 1000 mg via INTRAVENOUS

## 2021-11-27 MED ORDER — EPHEDRINE SULFATE (PRESSORS) 50 MG/ML IJ SOLN
INTRAMUSCULAR | Status: DC | PRN
Start: 2021-11-27 — End: 2021-11-27
  Administered 2021-11-27: 10 mg via INTRAVENOUS
  Administered 2021-11-27: 5 mg via INTRAVENOUS
  Administered 2021-11-27: 10 mg via INTRAVENOUS

## 2021-11-27 MED ORDER — PHENYLEPHRINE 40 MCG/ML (10ML) SYRINGE FOR IV PUSH (FOR BLOOD PRESSURE SUPPORT)
PREFILLED_SYRINGE | INTRAVENOUS | Status: DC | PRN
Start: 2021-11-27 — End: 2021-11-27
  Administered 2021-11-27 (×3): 100 ug via INTRAVENOUS
  Administered 2021-11-27: 200 ug via INTRAVENOUS

## 2021-11-27 MED ORDER — POTASSIUM CHLORIDE CRYS ER 20 MEQ PO TBCR
20.0000 meq | EXTENDED_RELEASE_TABLET | Freq: Once | ORAL | Status: AC
  Administered 2021-11-27: 20 meq via ORAL
  Filled 2021-11-27: qty 1

## 2021-11-27 MED ORDER — ONDANSETRON HCL 4 MG/2ML IJ SOLN: 4.0000 mg | Freq: Once | INTRAMUSCULAR | Status: DC | PRN

## 2021-11-27 MED ORDER — SODIUM CHLORIDE 0.9 % IV SOLN
75.0000 mL/h | INTRAVENOUS | Status: DC
  Administered 2021-11-27: 75 mL/h via INTRAVENOUS

## 2021-11-27 MED ORDER — PHENYLEPHRINE HCL-NACL 20-0.9 MG/250ML-% IV SOLN
INTRAVENOUS | Status: DC | PRN
  Administered 2021-11-27: 40 ug/min via INTRAVENOUS

## 2021-11-27 MED ORDER — METHOCARBAMOL 1000 MG/10ML IJ SOLN
500.0000 mg | Freq: Four times a day (QID) | INTRAVENOUS | Status: DC | PRN
  Filled 2021-11-27: qty 5

## 2021-11-27 MED ORDER — POTASSIUM CHLORIDE CRYS ER 20 MEQ PO TBCR: 20.0000 meq | EXTENDED_RELEASE_TABLET | ORAL | Status: DC

## 2021-11-27 MED ORDER — PROPOFOL 10 MG/ML IV BOLUS
INTRAVENOUS | Status: DC | PRN
  Administered 2021-11-27: 80 mg via INTRAVENOUS

## 2021-11-27 MED ORDER — OXYCODONE HCL 5 MG PO TABS: 5.0000 mg | ORAL_TABLET | Freq: Once | ORAL | Status: DC | PRN

## 2021-11-27 SURGICAL SUPPLY — 67 items
APL PRP STRL LF DISP 70% ISPRP (MISCELLANEOUS) ×1
BIT DRILL 100X2.5XANTM LCK (BIT) IMPLANT
BIT DRILL 2.5X2.75 QC CALB (BIT) ×1 IMPLANT
BIT DRL 100X2.5XANTM LCK (BIT) ×2
BLADE MED AGGRESSIVE (BLADE) ×1 IMPLANT
BNDG ELASTIC 6X5.8 VLCR STR LF (GAUZE/BANDAGES/DRESSINGS) ×1 IMPLANT
BONE CANC CHIPS 20CC PCAN1/4 (Bone Implant) ×2 IMPLANT
CHIPS CANC BONE 20CC PCAN1/4 (Bone Implant) ×1 IMPLANT
CHLORAPREP W/TINT 26 (MISCELLANEOUS) ×2 IMPLANT
CUFF TOURN SGL QUICK 24 (TOURNIQUET CUFF)
CUFF TOURN SGL QUICK 34 (TOURNIQUET CUFF)
CUFF TRNQT CYL 24X4X16.5-23 (TOURNIQUET CUFF) IMPLANT
CUFF TRNQT CYL 34X4.125X (TOURNIQUET CUFF) IMPLANT
DRAPE 3/4 80X56 (DRAPES) ×2 IMPLANT
DRAPE C-ARM XRAY 36X54 (DRAPES) ×2 IMPLANT
DRAPE C-ARMOR (DRAPES) ×2 IMPLANT
DRILL BIT 2.5MM (BIT) ×5
DRSG AQUACEL AG ADV 3.5X10 (GAUZE/BANDAGES/DRESSINGS) ×1 IMPLANT
DRSG EMULSION OIL 3X8 NADH (GAUZE/BANDAGES/DRESSINGS) ×2 IMPLANT
ELECT CAUTERY BLADE 6.4 (BLADE) ×2 IMPLANT
ELECT REM PT RETURN 9FT ADLT (ELECTROSURGICAL) ×2
ELECTRODE REM PT RTRN 9FT ADLT (ELECTROSURGICAL) ×1 IMPLANT
GAUZE SPONGE 4X4 12PLY STRL (GAUZE/BANDAGES/DRESSINGS) ×2 IMPLANT
GLOVE SURG SYN 9.0  PF PI (GLOVE) ×1
GLOVE SURG SYN 9.0 PF PI (GLOVE) ×1 IMPLANT
GOWN SRG 2XL LVL 4 RGLN SLV (GOWNS) ×1 IMPLANT
GOWN STRL NON-REIN 2XL LVL4 (GOWNS) ×2
GOWN STRL REUS W/ TWL LRG LVL3 (GOWN DISPOSABLE) ×1 IMPLANT
GOWN STRL REUS W/TWL LRG LVL3 (GOWN DISPOSABLE) ×2
GRAFT BNE CANC CHIPS 1-8 20CC (Bone Implant) IMPLANT
HEMOVAC 400CC 10FR (MISCELLANEOUS) ×2 IMPLANT
IMMOB KNEE 24 THIGH 24 443303 (SOFTGOODS) ×1 IMPLANT
K-WIRE ACE 1.6X6 (WIRE) ×6
KIT TURNOVER KIT A (KITS) ×2 IMPLANT
KWIRE ACE 1.6X6 (WIRE) IMPLANT
MANIFOLD NEPTUNE II (INSTRUMENTS) ×2 IMPLANT
NDL FILTER BLUNT 18X1 1/2 (NEEDLE) ×1 IMPLANT
NEEDLE FILTER BLUNT 18X 1/2SAF (NEEDLE) ×1
NEEDLE FILTER BLUNT 18X1 1/2 (NEEDLE) ×1 IMPLANT
NS IRRIG 1000ML POUR BTL (IV SOLUTION) ×2 IMPLANT
PACK TOTAL KNEE (MISCELLANEOUS) ×2 IMPLANT
PAD PREP 24X41 OB/GYN DISP (PERSONAL CARE ITEMS) ×2 IMPLANT
PADDING CAST ABS 4INX4YD NS (CAST SUPPLIES) ×1
PADDING CAST ABS COTTON 4X4 ST (CAST SUPPLIES) IMPLANT
PLATE LOCK 5H STD RT PROX TIB (Plate) ×1 IMPLANT
SCALPEL PROTECTED #10 DISP (BLADE) ×4 IMPLANT
SCREW CORTICAL 3.5MM  42MM (Screw) ×1 IMPLANT
SCREW CORTICAL 3.5MM  44MM (Screw) ×1 IMPLANT
SCREW CORTICAL 3.5MM 40MM (Screw) ×1 IMPLANT
SCREW CORTICAL 3.5MM 42MM (Screw) IMPLANT
SCREW CORTICAL 3.5MM 44MM (Screw) IMPLANT
SCREW LOCK CORT STAR 3.5X60 (Screw) ×1 IMPLANT
SCREW LOCK CORT STAR 3.5X70 (Screw) ×1 IMPLANT
SCREW LOCK CORT STAR 3.5X75 (Screw) ×1 IMPLANT
SCREW LOCK CORT STAR 3.5X80 (Screw) ×3 IMPLANT
SCREW LP 3.5X70MM (Screw) ×1 IMPLANT
SCREW LP 3.5X85MM (Screw) ×1 IMPLANT
STAPLER SKIN PROX 35W (STAPLE) ×2 IMPLANT
STRAP SAFETY 5IN WIDE (MISCELLANEOUS) ×2 IMPLANT
SUT VIC AB 0 CT1 36 (SUTURE) ×1 IMPLANT
SUT VIC AB 2-0 SH 27 (SUTURE) ×2
SUT VIC AB 2-0 SH 27XBRD (SUTURE) ×1 IMPLANT
SUT VICRYL 1-0 27IN ABS (SUTURE) ×2
SUTURE VICRYL 1-0 27IN ABS (SUTURE) ×1 IMPLANT
SYR 5ML LL (SYRINGE) ×2 IMPLANT
TRAY FOLEY MTR SLVR 16FR STAT (SET/KITS/TRAYS/PACK) ×2 IMPLANT
WATER STERILE IRR 500ML POUR (IV SOLUTION) ×2 IMPLANT

## 2021-11-27 NOTE — Progress Notes (Addendum)
PHARMACY CONSULT NOTE ? ?Pharmacy Consult for Electrolyte Monitoring and Replacement  ? ?Recent Labs: ?Potassium (mmol/L)  ?Date Value  ?11/27/2021 3.2 (L)  ? ?Magnesium (mg/dL)  ?Date Value  ?11/27/2021 1.9  ? ?Calcium (mg/dL)  ?Date Value  ?11/27/2021 8.4 (L)  ? ?Albumin (g/dL)  ?Date Value  ?11/27/2021 3.4 (L)  ? ?Phosphorus (mg/dL)  ?Date Value  ?11/27/2021 4.0  ? ?Sodium (mmol/L)  ?Date Value  ?11/27/2021 141  ? ? ? ?Assessment: 66 y.o. male with PMH of ESRD on HD MWF, essential hypertension, type 2 diabetes, hyperlipidemia, CAD s/p CABG, HFrEF 35%, duodenal ulcers, history of GI bleed, prostate cancer, with metastasis to bone, left renal mass, who presented to [facility] ED via EMS after a mechanical fall at the hemodialysis center. Pharmacy is asked to follow and replace electrolytes  ? ?Goal of Therapy:  ?Electrolytes WNL ? ?Plan:  ?K 3.2. Give 15mq Kcl PO x1  ?Recheck electrolytes with AM labs ? ? ?Thank you for allowing pharmacy to be a part of this patient?s care. ? ?ADarrick Penna PharmD, MS PGPM ?Clinical Pharmacist ?11/27/2021 ?3:22 PM ? ? ?

## 2021-11-27 NOTE — Progress Notes (Signed)
PHARMACY CONSULT NOTE ? ?Pharmacy Consult for Electrolyte Monitoring and Replacement  ? ?Recent Labs: ?Potassium (mmol/L)  ?Date Value  ?11/27/2021 2.8 (L)  ? ?Magnesium (mg/dL)  ?Date Value  ?11/27/2021 1.9  ? ?Calcium (mg/dL)  ?Date Value  ?11/27/2021 8.2 (L)  ? ?Albumin (g/dL)  ?Date Value  ?11/27/2021 3.4 (L)  ? ?Phosphorus (mg/dL)  ?Date Value  ?11/27/2021 4.0  ? ?Sodium (mmol/L)  ?Date Value  ?11/27/2021 138  ? ? ? ?Assessment: 66 y.o. male with PMH of ESRD on HD MWF, essential hypertension, type 2 diabetes, hyperlipidemia, CAD s/p CABG, HFrEF 35%, duodenal ulcers, history of GI bleed, prostate cancer, with metastasis to bone, left renal mass, who presented to [facility] ED via EMS after a mechanical fall at the hemodialysis center. Pharmacy is asked to follow and replace electrolytes  ? ?Goal of Therapy:  ?Electrolytes WNL ? ?Plan:  ?10 mEq IV KCl x 3 ?Recheck potassium at 1800 ? ?Dallie Piles ,PharmD ?Clinical Pharmacist ?11/27/2021 8:04 AM ? ?

## 2021-11-27 NOTE — Progress Notes (Addendum)
[place] at [facility] ? ? ?PATIENT NAME: Micheal Aguirre   ? ?MR#:  [id number] ? ?DATE OF BIRTH:  06/01/1956 ? ?SUBJECTIVE:  ? ?patient came in after having a mechanical fall in between dialysis session while going to the bathroom. Patient started having knee pain found to have tibial plateau fracture. ?Family at bedside ? ? ?VITALS:  ?Blood pressure 130/81, pulse 97, temperature (!) 100.7 ?F (38.2 ?C), resp. rate 18, height '5\' 9"'$  (1.753 m), weight 81.6 kg, SpO2 95 %. ? ?PHYSICAL EXAMINATION:  ? ?GENERAL:  66 y.o.-year-old patient lying in the bed with no acute distress.  ?LUNGS: Normal breath sounds bilaterally, no wheezing, rales, rhonchi.  ?CARDIOVASCULAR: S1, S2 normal. No murmurs, rubs, or gallops.  ?ABDOMEN: Soft, nontender, nondistended. Bowel sounds present.  ?EXTREMITIES: right knee swelling. HD access ?NEUROLOGIC: nonfocal  patient is alert and awake ?SKIN: No obvious rash, lesion, or ulcer.  ? ?LABORATORY PANEL:  ?CBC ?Recent Labs  ?Lab 11/27/21 ?0559  ?WBC 6.1  ?HGB 12.1*  ?HCT 38.4*  ?PLT 127*  ? ? ?Chemistries  ?Recent Labs  ?Lab 11/27/21 ?0559  ?NA 138  ?K 2.8*  ?CL 102  ?CO2 26  ?GLUCOSE 113*  ?BUN 26*  ?CREATININE 3.91*  ?CALCIUM 8.2*  ?MG 1.9  ?AST 20  ?ALT 18  ?ALKPHOS 116  ?BILITOT 0.6  ? ?Cardiac Enzymes ?No results for input(s): TROPONINI in the last 168 hours. ?RADIOLOGY:  ?DG Ankle Complete Right ? ?Result Date: 11/26/2021 ?CLINICAL DATA:  Trauma, pain EXAM: RIGHT ANKLE - COMPLETE 3+ VIEW COMPARISON:  None. FINDINGS: No recent fracture or dislocation is seen. Osteopenia is seen in bony structures. Small bony spurs seen in the distal tibia. Bony spurs are noted in the dorsal aspect of intertarsal and tarsometatarsal joints in the lateral view. There is possible tiny plantar spur in the calcaneus. There is soft tissue swelling around the ankle, more so over the lateral malleolus. IMPRESSION: No recent fracture or dislocation is seen in the right ankle. Degenerative  changes are noted in multiple joints as described in the body of the report. There is possible small plantar spur in the calcaneus. Electronically Signed   By: Elmer Picker M.D.   On: 11/26/2021 17:59  ? ?CT Cervical Spine Wo Contrast ? ?Result Date: 11/26/2021 ?CLINICAL DATA:  Neck trauma EXAM: CT CERVICAL SPINE WITHOUT CONTRAST TECHNIQUE: Multidetector CT imaging of the cervical spine was performed without intravenous contrast. Multiplanar CT image reconstructions were also generated. RADIATION DOSE REDUCTION: This exam was performed according to the departmental dose-optimization program which includes automated exposure control, adjustment of the mA and/or kV according to patient size and/or use of iterative reconstruction technique. COMPARISON:  None. FINDINGS: Alignment: Straightening and mild reversal of the normal cervical lordosis, with trace anterolisthesis of C4 on C5 and trace retrolisthesis of C5 on C6, which appears degenerative. Skull base and vertebrae: No acute fracture. No primary bone lesion or focal pathologic process. Osteopenia. Soft tissues and spinal canal: No prevertebral fluid or swelling. No visible canal hematoma. Vascular calcifications. Disc levels: Multilevel degenerative changes, with mild spinal canal stenosis at C4-C5 and C5-C6. Multilevel uncovertebral and facet arthropathy, which causes severe left neural foraminal narrowing at C4-C5 and moderate to severe neural foraminal narrowing on the right at C5-C6. Upper chest: No focal pulmonary opacity or pleural effusion. Other: None. IMPRESSION: No acute fracture or traumatic listhesis in the cervical spine. Electronically Signed   By: Merilyn Baba M.D.   On: 11/26/2021 19:10  ? ?  CT Knee Right Wo Contrast ? ?Result Date: 11/26/2021 ?CLINICAL DATA:  Knee trauma.  Tibial plateau fracture. EXAM: CT OF THE RIGHT KNEE WITHOUT CONTRAST 3-DIMENSIONAL CT IMAGE RENDERING ON ACQUISITION WORKSTATION TECHNIQUE: Multidetector CT imaging of the  right knee was performed according to the standard protocol. Multiplanar CT image reconstructions were also generated. 3-dimensional CT images were rendered by post-processing of the original CT data on an acquisition workstation. The 3-dimensional CT images were interpreted and findings were reported in the accompanying complete CT report for this study RADIATION DOSE REDUCTION: This exam was performed according to the departmental dose-optimization program which includes automated exposure control, adjustment of the mA and/or kV according to patient size and/or use of iterative reconstruction technique. COMPARISON:  Radiographs same date FINDINGS: Bones/Joint/Cartilage The bones are demineralized. There is a markedly impacted intra-articular fracture of the lateral tibial plateau with impaction of the articular surface by up to 2 cm anteriorly. There is distal extension of a nondisplaced fracture into the lateral aspect of the proximal tibial metadiaphysis. There is central extension of the intra-articular component of the fracture with probable undermining of the tibial spines which appear nondisplaced. No definite fracture of the medial tibial plateau. The distal femur and patella appear intact. There is posttraumatic deformity of the proximal fibula with chronic periosteal reaction. There is a large lipohemarthrosis. Ligaments Suboptimally assessed by CT. Muscles and Tendons The extensor mechanism is intact. Mild generalized muscular atrophy without focal intramuscular fluid collection. Soft tissues Moderate-sized Baker's cyst. There is a well-circumscribed soft tissue nodule posteromedially in the distal thigh which has a peripheral rim of high density which may reflect calcification. This appears nonacute. Femoropopliteal atherosclerosis noted. IMPRESSION: 1. Markedly impacted intra-articular fracture of the lateral tibial plateau as described. This fracture demonstrates distal extension of a nondisplaced  component, and there is probable undermining of both tibial spines. No involvement of the medial tibial plateau. 2. Posttraumatic deformity of the proximal fibula. 3. Large lipohemarthrosis with moderate-sized Baker's cyst. 4. Soft tissue nodule posteriorly in the distal thigh appears nonacute and may reflect a lymph node or sequela of prior trauma. This has a nonaggressive appearance. Electronically Signed   By: Richardean Sale M.D.   On: 11/26/2021 19:22  ? ?CT 3D RECON AT [facility] ? ?Result Date: 11/26/2021 ?CLINICAL DATA:  Knee trauma.  Tibial plateau fracture. EXAM: CT OF THE RIGHT KNEE WITHOUT CONTRAST 3-DIMENSIONAL CT IMAGE RENDERING ON ACQUISITION WORKSTATION TECHNIQUE: Multidetector CT imaging of the right knee was performed according to the standard protocol. Multiplanar CT image reconstructions were also generated. 3-dimensional CT images were rendered by post-processing of the original CT data on an acquisition workstation. The 3-dimensional CT images were interpreted and findings were reported in the accompanying complete CT report for this study RADIATION DOSE REDUCTION: This exam was performed according to the departmental dose-optimization program which includes automated exposure control, adjustment of the mA and/or kV according to patient size and/or use of iterative reconstruction technique. COMPARISON:  Radiographs same date FINDINGS: Bones/Joint/Cartilage The bones are demineralized. There is a markedly impacted intra-articular fracture of the lateral tibial plateau with impaction of the articular surface by up to 2 cm anteriorly. There is distal extension of a nondisplaced fracture into the lateral aspect of the proximal tibial metadiaphysis. There is central extension of the intra-articular component of the fracture with probable undermining of the tibial spines which appear nondisplaced. No definite fracture of the medial tibial plateau. The distal femur and patella appear intact. There is  posttraumatic deformity  of the proximal fibula with chronic periosteal reaction. There is a large lipohemarthrosis. Ligaments Suboptimally assessed by CT. Muscles and Tendons The extensor mechanism is intact. Mild g

## 2021-11-27 NOTE — Progress Notes (Signed)
[facility] denied a request for transfer per the patient's daughter's request.  I spoke again to the patient and his daughter Micheal Aguirre by phone.  They have agreed to proceed with surgery here at [facility] for open reduction internal fixation of the patient's right lateral tibial plateau fracture.  I reviewed the details of the operation as well as the postoperative course with them.  I reviewed the risk and benefits of surgery as dictated in my previous note.  All questions were answered.  The patient's right leg was marked in preparation for surgery.  He has been and n.p.o. and has been cleared by medicine for surgery. ?

## 2021-11-27 NOTE — Anesthesia Procedure Notes (Signed)
Procedure Name: LMA Insertion ?Date/Time: 11/27/2021 2:57 PM ?Performed by: Garner Nash, CRNA ?Pre-anesthesia Checklist: Patient identified, Emergency Drugs available, Suction available and Patient being monitored ?Patient Re-evaluated:Patient Re-evaluated prior to induction ?Oxygen Delivery Method: Circle system utilized ?Preoxygenation: Pre-oxygenation with 100% oxygen ?Induction Type: IV induction ?LMA: LMA inserted ?LMA Size: 5.0 ?Tube type: Oral ?Number of attempts: 1 ?Placement Confirmation: ETT inserted through vocal cords under direct vision, positive ETCO2 and breath sounds checked- equal and bilateral ?Tube secured with: Tape ?Dental Injury: Teeth and Oropharynx as per pre-operative assessment  ? ? ? ? ?

## 2021-11-27 NOTE — Anesthesia Preprocedure Evaluation (Addendum)
Anesthesia Evaluation  ?Patient identified by MRN, date of birth, ID band ?Patient awake ? ?General Assessment Comment:Hypokalemic this morning at 2.8, but has since received 30 meq of IV potassium and oral repletion as well. ? ?Patient AOx3, asks appropriate questions regarding the anesthesia plan. Does have a hx of dementia but I believe he has the capacity to consent for the anesthesia.  ? ?Reviewed: ?Allergy & Precautions, NPO status , Patient's Chart, lab work & pertinent test results ? ?History of Anesthesia Complications ?Negative for: history of anesthetic complications ? ?Airway ?Mallampati: III ? ?TM Distance: >3 FB ?Neck ROM: Full ? ? ? Dental ? ?(+) Chipped, Poor Dentition ?  ?Pulmonary ?neg pulmonary ROS, neg sleep apnea, neg COPD, Patient abstained from smoking.Not current smoker, former smoker,  ?  ?Pulmonary exam normal ?breath sounds clear to auscultation ? ? ? ? ? ? Cardiovascular ?Exercise Tolerance: Good ?METShypertension, Pt. on medications ?(-) CAD and (-) Past MI (-) dysrhythmias  ?Rhythm:Regular Rate:Normal ?- Systolic murmurs ? ?  ?Neuro/Psych ?PSYCHIATRIC DISORDERS Dementia negative neurological ROS ?   ? GI/Hepatic ?neg GERD  ,(+)  ?  ? (-) substance abuse ? ,   ?Endo/Other  ?diabetes, Insulin Dependent ? Renal/GU ?ESRFRenal diseaseLast dialysis yesterday.  ? ?  ?Musculoskeletal ? ? Abdominal ?  ?Peds ? Hematology ?  ?Anesthesia Other Findings ?Past Medical History: ?No date: Diabetes mellitus without complication ([place]) ?No date: ESRD (end stage renal disease) ([place]) ?No date: Hypercholesteremia ?No date: Hypertension ?No date: Renal disorder ? Reproductive/Obstetrics ? ?  ? ? ? ? ? ? ? ? ? ? ? ? ? ?  ?  ? ? ? ? ? ? ? ? ?Anesthesia Physical ?Anesthesia Plan ? ?ASA: 4 ? ?Anesthesia Plan: General  ? ?Post-op Pain Management: Ofirmev IV (intra-op)*  ? ?Induction: Intravenous ? ?PONV Risk Score and Plan: 3 and Ondansetron, Dexamethasone and Treatment may vary  due to age or medical condition ? ?Airway Management Planned: Oral ETT ? ?Additional Equipment: None ? ?Intra-op Plan:  ? ?Post-operative Plan: Extubation in OR ? ?Informed Consent: I have reviewed the patients History and Physical, chart, labs and discussed the procedure including the risks, benefits and alternatives for the proposed anesthesia with the patient or authorized representative who has indicated his/her understanding and acceptance.  ? ? ? ?Dental advisory given and Consent reviewed with POA ? ?Plan Discussed with: CRNA and Surgeon ? ?Anesthesia Plan Comments: (Discussed r/b/a of spinal anesthesia vs GETA with patient, and patient ultimately requested GETA. Discussed the risks of anesthesia with patient, including PONV, sore throat, lip/dental/eye damage. Rare risks discussed as well, such as cardiorespiratory and neurological sequelae, and allergic reactions. Patient counseled on being higher risk for anesthesia due to comorbidities: ESRD. Patient was told about increased risk of cardiac and respiratory events, including death. ?Discussed the role of CRNA in patient's perioperative care. Patient understands.  ?He also requested I update his daughter Claris Pong, who is his healthcare POA, and lives in [place]. I called her and reiterated the above risks, and spoke of how the patient had appropriate capacity to consent for the anesthetic care. She was appreciative, and her questions were answered to her satisfaction.)  ? ? ? ? ? ?Anesthesia Quick Evaluation ? ?

## 2021-11-27 NOTE — Consult Note (Signed)
?ORTHOPAEDIC CONSULTATION ? ?REQUESTING PHYSICIAN: Fritzi Mandes, MD ? ?Chief Complaint: Right knee pain status post fall ? ?HPI: ?Micheal Aguirre is a 66 y.o. male who was admitted overnight to the hospital service after falling at dialysis.  Patient has type 2 diabetes, hypertension, end-stage renal disease on hemodialysis, coronary artery disease, and metastatic prostate cancer.  X-rays upon admission to [facility] emergency department demonstrated a displaced, depressed lateral tibial plateau fracture.  Patient was also noted by the ED physician, Dr. Starleen Blue, to have right-sided foot drop.  Patient was admitted to the hospital service and orthopedics is consulted for management of his lateral tibial plateau fracture.  Today the patient is seen in his hospital room.  Patient is in no acute distress.  His daughter who is his power of attorney also requested a call regarding his treatment plan. ? ?Past Medical History:  ?Diagnosis Date  ? Diabetes mellitus without complication ([place])   ? ESRD (end stage renal disease) ([place])   ? Hypercholesteremia   ? Hypertension   ? Renal disorder   ? ?History reviewed. No pertinent surgical history. ?Social History  ? ?Socioeconomic History  ? Marital status: Divorced  ?  Spouse name: Not on file  ? Number of children: Not on file  ? Years of education: Not on file  ? Highest education level: Not on file  ?Occupational History  ? Not on file  ?Tobacco Use  ? Smoking status: Former  ?  Types: Cigarettes  ? Smokeless tobacco: Never  ?Vaping Use  ? Vaping Use: Never used  ?Substance and Sexual Activity  ? Alcohol use: Not Currently  ? Drug use: Never  ? Sexual activity: Not on file  ?Other Topics Concern  ? Not on file  ?Social History Narrative  ? Not on file  ? ?Social Determinants of Health  ? ?Financial Resource Strain: Not on file  ?Food Insecurity: Not on file  ?Transportation Needs: Not on file  ?Physical Activity: Not on file  ?Stress: Not on file  ?Social Connections: Not on  file  ? ?No family history on file. ?Not on File ?Prior to Admission medications   ?Medication Sig Start Date End Date Taking? Authorizing Provider  ?acetaminophen (TYLENOL) 500 MG tablet Take 1,000 mg by mouth every 8 (eight) hours as needed for mild pain or moderate pain.   Yes [provider]  ?amLODipine (NORVASC) 5 MG tablet Take 5 mg by mouth daily.   Yes [provider]  ?atorvastatin (LIPITOR) 80 MG tablet Take 80 mg by mouth daily.   Yes [provider]  ?bisacodyl (DULCOLAX) 5 MG EC tablet Take 10 mg by mouth daily.   Yes [provider]  ?calcium carbonate (TUMS - DOSED IN MG ELEMENTAL CALCIUM) 500 MG chewable tablet Chew 500 mg by mouth 2 (two) times daily.   Yes [provider]  ?carvedilol (COREG) 25 MG tablet Take 25 mg by mouth 2 (two) times daily with a meal.   Yes [provider]  ?folic acid (FOLVITE) 1 MG tablet Take 1 mg by mouth daily.   Yes [provider]  ?gabapentin (NEURONTIN) 100 MG capsule Take 100 mg by mouth at bedtime.   Yes [provider]  ?insulin glargine (LANTUS) 100 UNIT/ML injection Inject 8 Units into the skin at bedtime. (Hold for glucose <120)   Yes [provider]  ?insulin lispro (HUMALOG) 100 UNIT/ML injection Inject 4 Units into the skin daily with lunch. (Hold for glucose <120)   Yes  [provider]  ?insulin lispro (HUMALOG) 100 UNIT/ML injection Inject 6 Units into the skin daily with supper. (Hold for glucose <120)   Yes [provider]  ?melatonin 3 MG TABS tablet Take 6 mg by mouth at bedtime.   Yes [provider]  ?Multiple Vitamins-Minerals (MULTIVITAMIN WITH MINERALS) tablet Take 1 tablet by mouth every evening.   Yes [provider]  ?oxybutynin (DITROPAN) 5 MG tablet Take 5 mg by mouth every 8 (eight) hours as needed for bladder spasms.   Yes [provider]  ?pantoprazole (PROTONIX) 40 MG tablet Take 40 mg by mouth every 12 (twelve)  hours.   Yes [provider]  ?polyethylene glycol (MIRALAX / GLYCOLAX) 17 g packet Take 17 g by mouth at bedtime.   Yes [provider]  ?predniSONE (DELTASONE) 10 MG tablet Take 10 mg by mouth daily with breakfast.   Yes [provider]  ?senna-docusate (SENOKOT-S) 8.6-50 MG tablet Take 2 tablets by mouth 2 (two) times daily.   Yes [provider]  ?simethicone (MYLICON) 80 MG chewable tablet Chew 80 mg by mouth every 6 (six) hours.   Yes [provider]  ?tamsulosin (FLOMAX) 0.4 MG CAPS capsule Take 0.4 mg by mouth at bedtime.   Yes [provider]  ?torsemide (DEMADEX) 20 MG tablet Take 20 mg by mouth 2 (two) times daily.   Yes [provider]  ?vitamin B-12 (CYANOCOBALAMIN) 500 MCG tablet Take 500 mcg by mouth in the morning.   Yes [provider]  ?Vitamin D, Ergocalciferol, (DRISDOL) 1.25 MG (50000 UNIT) CAPS capsule Take 50,000 Units by mouth every Saturday.   Yes [provider]  ?zinc oxide 20 % ointment Apply 1 application. topically every 8 (eight) hours as needed (peri-skin care).   Yes [provider]  ? ?DG Ankle Complete Right ? ?Result Date: 11/26/2021 ?CLINICAL DATA:  Trauma, pain EXAM: RIGHT ANKLE - COMPLETE 3+ VIEW COMPARISON:  None. FINDINGS: No recent fracture or dislocation is seen. Osteopenia is seen in bony structures. Small bony spurs seen in the distal tibia. Bony spurs are noted in the dorsal aspect of intertarsal and tarsometatarsal joints in the lateral view. There is possible tiny plantar spur in the calcaneus. There is soft tissue swelling around the ankle, more so over the lateral malleolus. IMPRESSION: No recent fracture or dislocation is seen in the right ankle. Degenerative changes are noted in multiple joints as described in the body of the report. There is possible small plantar spur in the calcaneus. Electronically Signed   By: Elmer Picker M.D.   On: 11/26/2021 17:59  ? ?CT Cervical  Spine Wo Contrast ? ?Result Date: 11/26/2021 ?CLINICAL DATA:  Neck trauma EXAM: CT CERVICAL SPINE WITHOUT CONTRAST TECHNIQUE: Multidetector CT imaging of the cervical spine was performed without intravenous contrast. Multiplanar CT image reconstructions were also generated. RADIATION DOSE REDUCTION: This exam was performed according to the departmental dose-optimization program which includes automated exposure control, adjustment of the mA and/or kV according to patient size and/or use of iterative reconstruction technique. COMPARISON:  None. FINDINGS: Alignment: Straightening and mild reversal of the normal cervical lordosis, with trace anterolisthesis of C4 on C5 and trace retrolisthesis of C5 on C6, which appears degenerative. Skull base and vertebrae: No acute fracture. No primary bone lesion or focal pathologic process. Osteopenia. Soft tissues and spinal canal: No prevertebral fluid or swelling. No visible canal hematoma. Vascular calcifications. Disc levels: Multilevel degenerative changes, with mild spinal canal stenosis at C4-C5  and C5-C6. Multilevel uncovertebral and facet arthropathy, which causes severe left neural foraminal narrowing at C4-C5 and moderate to severe neural foraminal narrowing on the right at C5-C6. Upper chest: No focal pulmonary opacity or pleural effusion. Other: None. IMPRESSION: No acute fracture or traumatic listhesis in the cervical spine. Electronically Signed   By: Merilyn Baba M.D.   On: 11/26/2021 19:10  ? ?CT Knee Right Wo Contrast ? ?Result Date: 11/26/2021 ?CLINICAL DATA:  Knee trauma.  Tibial plateau fracture. EXAM: CT OF THE RIGHT KNEE WITHOUT CONTRAST 3-DIMENSIONAL CT IMAGE RENDERING ON ACQUISITION WORKSTATION TECHNIQUE: Multidetector CT imaging of the right knee was performed according to the standard protocol. Multiplanar CT image reconstructions were also generated. 3-dimensional CT images were rendered by post-processing of the original CT data on an acquisition  workstation. The 3-dimensional CT images were interpreted and findings were reported in the accompanying complete CT report for this study RADIATION DOSE REDUCTION: This exam was performed according to the de

## 2021-11-27 NOTE — Plan of Care (Signed)

## 2021-11-27 NOTE — Transfer of Care (Signed)
Immediate Anesthesia Transfer of Care Note ? ?Patient: Micheal Aguirre ? ?Procedure(s) Performed: OPEN REDUCTION INTERNAL FIXATION (ORIF) TIBIA FRACTURE (Right: Leg Lower) ? ?Patient Location: PACU ? ?Anesthesia Type:General ? ?Level of Consciousness: awake ? ?Airway & Oxygen Therapy: Patient Spontanous Breathing ? ?Post-op Assessment: Report given to RN ? ?Post vital signs: stable ? ?Last Vitals:  ?Vitals Value Taken Time  ?BP    ?Temp    ?Pulse    ?Resp    ?SpO2    ? ? ?Last Pain:  ?Vitals:  ? 11/27/21 1303  ?TempSrc:   ?PainSc: 5   ?   ? ?  ? ?Complications: No notable events documented. ?

## 2021-11-27 NOTE — Op Note (Signed)
11/27/2021 ? ?5:34 PM ? ?PATIENT:  Micheal Aguirre   ? ?PRE-OPERATIVE DIAGNOSIS: Closed displaced right lateral tibial plateau fracture ? ?POST-OPERATIVE DIAGNOSIS:  Same ? ?PROCEDURE: Open reduction internal fixation of right lateral tibial plateau fracture ? ?SURGEON:  Thornton Park, MD ? ?ANESTHESIA:   General ? ?TOURNIQUET TIME: 99 minutes @ 250 mmHg ? ?PREOPERATIVE INDICATIONS:  Micheal Aguirre is a  66 y.o. male with a diagnosis of Right displaced lateral tibial plateau fracture.  X-rays demonstrate significant depression of the articular surface of the lateral tibial plateau.  For this reason the patient was recommended for an open reduction internal fixation. ? ?I discussed the details of the operation as well as the postoperative course with the patient in the hospital and his daughter by phone.  I also discussed with them the risks and benefits of surgery. They understand the risks include but are not limited to infection, bleeding requiring blood transfusion, nerve or blood vessel injury, joint stiffness or loss of motion, persistent pain, weakness or instability, malunion, nonunion and hardware failure and the need for further surgery. Medical risks include but are not limited to DVT and pulmonary embolism, myocardial infarction, stroke, pneumonia, respiratory failure and death. Patient and his daughter understood these risks and wished to proceed.  ? ? ?OPERATIVE IMPLANTS: [employer] 5 hole ALPS proximal tibial plate ? ?OPERATIVE FINDINGS: Impacted and depressed fracture of the lateral tibial plateau ? ?OPERATIVE PROCEDURE: Patient was met in the preoperative area.  I marked the right knee according the hospital's correct site of surgery protocol.  I had spoken with the patient and his daughter, Micheal Aguirre, previously regarding the details of the operation, the postoperative course and the risk and benefits of surgery.  The anesthesiologist and I again spoke with the patient's daughter in the preoperative  area as she is the patient's power of attorney.   Once consent was obtained the patient was brought to the operating room where he was placed supine on a flat top translucent table.  Patient underwent general anesthesia.  All bony prominences were adequately padded.  A tourniquet was applied to the right upper thigh.  The patient was prepped and draped in a sterile fashion.  A timeout was performed to verify the patient's name, date of birth, medical record number, correct site of surgery and correct procedure to be performed.  Once all in attendance were in agreement the case began. ? ?Initial C arm images were taken of the right knee for incision planning.  A curved incision was made over the lateral knee joint extending just lateral to the anterior crest of the tibia.  Bleeding vessels were cauterized.  Full-thickness skin flaps were developed.  The anterior compartment fascia was incised along the lateral condyle extending just lateral to the anterior tibial crest.  The anterior compartment muscles proximally were then bluntly elevated from the lateral tibial condyle.  A vertical fracture line was then visualized through the lateral tibial condyle.  A transverse incision was made along the anterior tibial plateau which allowed for evacuation of a large hemarthrosis of the right knee.  The tourniquet was inflated due to persistent oozing from the fracture site.  The vertical fracture line was then booked open and held in place with a small self-retaining retractor.  The fracture site was copiously irrigated.  The articular surface was then visualized approximately 2 cm below the anatomic articular surface.  A freer elevator was used to free the depressed articular fragment along with its associated underlying  bone.  The osteochondral fragment was then elevated and reduced.  C arm images were taken to confirm anatomic reduction.  Allograft cancellous bone chips were then used to fill the osseous void under the  articular fragment within the lateral tibial condyle.  The vertical fracture was then closed around the allograft chips. ? ?A 5 hole [employer] ALPS proximal tibial plate was then positioned along the lateral tibial plateau and condyle.  It was provisionally held into position with 3 K wires.  The position of the plate was confirmed on AP and lateral C-arm images.  An initial bicortical screw was placed through the distal row of raft screws to reduce the plate alongside the tibial plateau.  Its depth was measured with a depth gauge.  The screw position was confirmed on C arm imaging.  An additional 6 locking raft screws were then placed into the tibial plateau to support the articular osteochondral fragment.  3 distal bicortical shaft screws were then placed along with a single bicortical kickstand screw.  Once all screws were in place final images of the construct were taken.  Patient had anatomic reduction of the lateral tibial plateau fracture.  The hardware is well-positioned.  There was no evidence of surgical complication. ? ?The lateral wound was then copiously irrigated.  The deep fascia and lateral arthrotomy incision were closed with 1 Vicryl.  The wound was then again copiously irrigated and the subcutaneous tissue was closed with 0 and 2-0 Vicryl.  The skin was approximated with staples.  An Aquacel dressing was placed over the incision with an overlying compressive dressing.  The tourniquet was deflated at 99 minutes. Patient was awoken and brought to the PACU in stable condition.  I was scrubbed and present for the entire case.  All sharp, sponge and instrument counts were correct at the conclusion of the case. ? ?I spoke with the patient's daughter by phone from the recovery room to inform her that the case had been performed successfully and without complication and that her father was stable in the recovery room. ?

## 2021-11-27 NOTE — Progress Notes (Signed)
Patient's oral temp 101.2. Sharion Settler, NP made aware. Per NP, give PO tylenol.  ?

## 2021-11-27 NOTE — Consult Note (Signed)
Micheal Aguirre MRN: [id number] DOB/AGE: 67-May-1957 67 y.o. Primary Care Physician:Pcp, No Admit date: 11/26/2021 Chief Complaint:  Chief Complaint  Patient presents with   Fall   HPI:   Patient is a 66 year old African-American male with a past medical history of End-stage renal disease, patient is on hemodialysis on Monday Wednesday Friday schedule, hypertension, diabetes mellitus type 2, coronary disease, patient is s/p CABG, systolic CHF, history of GI bleed, history of prostate cancer with metastasis to bone, history of left renal mass who came to the ER after a mechanical fall at the hemodialysis center today.    History of present illness dates back to today when immediately after his fall he incurred right knee pain with significant edema to his right knee.  No head trauma.   Upon evaluation in the ER   Right knee x-ray revealed right lateral tibial plateau fracture and joint effusion.  CT scan confirmed the same.  Patient was admitted for further treatment Orthopedics was consulted for fracture Nephrology was consulted for comanagement of dialysis patient Patient was seen on first floor Patient offers no new specific physical complaints Patient offers no complaint of chest pain No prior shortness of breath no complaint of fever cough or chills Patient main concern continues to be pain in his right knee  Past Medical History:  Diagnosis Date   Diabetes mellitus without complication (HCC)    ESRD (end stage renal disease) (HCC)    Hypercholesteremia    Hypertension    Renal disorder         No family history on file. Family hx NO hx of ESRD    Social History:  reports that he has quit smoking. His smoking use included cigarettes. He has never used smokeless tobacco. He reports that he does not currently use alcohol. He reports that he does not use drugs.   Allergies: Not on File  Medications Prior to Admission  Medication Sig Dispense Refill   acetaminophen (TYLENOL)  500 MG tablet Take 1,000 mg by mouth every 8 (eight) hours as needed for mild pain or moderate pain.     amLODipine (NORVASC) 5 MG tablet Take 5 mg by mouth daily.     atorvastatin (LIPITOR) 80 MG tablet Take 80 mg by mouth daily.     bisacodyl (DULCOLAX) 5 MG EC tablet Take 10 mg by mouth daily.     calcium carbonate (TUMS - DOSED IN MG ELEMENTAL CALCIUM) 500 MG chewable tablet Chew 500 mg by mouth 2 (two) times daily.     carvedilol (COREG) 25 MG tablet Take 25 mg by mouth 2 (two) times daily with a meal.     folic acid (FOLVITE) 1 MG tablet Take 1 mg by mouth daily.     gabapentin (NEURONTIN) 100 MG capsule Take 100 mg by mouth at bedtime.     insulin glargine (LANTUS) 100 UNIT/ML injection Inject 8 Units into the skin at bedtime. (Hold for glucose <120)     insulin lispro (HUMALOG) 100 UNIT/ML injection Inject 4 Units into the skin daily with lunch. (Hold for glucose <120)     insulin lispro (HUMALOG) 100 UNIT/ML injection Inject 6 Units into the skin daily with supper. (Hold for glucose <120)     melatonin 3 MG TABS tablet Take 6 mg by mouth at bedtime.     Multiple Vitamins-Minerals (MULTIVITAMIN WITH MINERALS) tablet Take 1 tablet by mouth every evening.     oxybutynin (DITROPAN) 5 MG tablet Take 5 mg by mouth every 8 (  eight) hours as needed for bladder spasms.     pantoprazole (PROTONIX) 40 MG tablet Take 40 mg by mouth every 12 (twelve) hours.     polyethylene glycol (MIRALAX / GLYCOLAX) 17 g packet Take 17 g by mouth at bedtime.     predniSONE (DELTASONE) 10 MG tablet Take 10 mg by mouth daily with breakfast.     senna-docusate (SENOKOT-S) 8.6-50 MG tablet Take 2 tablets by mouth 2 (two) times daily.     simethicone (MYLICON) 80 MG chewable tablet Chew 80 mg by mouth every 6 (six) hours.     tamsulosin (FLOMAX) 0.4 MG CAPS capsule Take 0.4 mg by mouth at bedtime.     torsemide (DEMADEX) 20 MG tablet Take 20 mg by mouth 2 (two) times daily.     vitamin B-12 (CYANOCOBALAMIN) 500 MCG  tablet Take 500 mcg by mouth in the morning.     Vitamin D, Ergocalciferol, (DRISDOL) 1.25 MG (50000 UNIT) CAPS capsule Take 50,000 Units by mouth every Saturday.     zinc oxide 20 % ointment Apply 1 application. topically every 8 (eight) hours as needed (peri-skin care).         WUJ:WJXBJ from the symptoms mentioned above,there are no other symptoms referable to all systems reviewed.   amLODipine  5 mg Oral Daily   atorvastatin  80 mg Oral Daily   calcium carbonate  500 mg Oral BID   carvedilol  25 mg Oral BID WC   folic acid  1 mg Oral Daily   gabapentin  100 mg Oral Daily   heparin  5,000 Units Subcutaneous Q8H   insulin aspart  0-5 Units Subcutaneous QHS   insulin aspart  0-6 Units Subcutaneous TID WC   multivitamin with minerals  1 tablet Oral QPM   oxybutynin  5 mg Oral TID   pantoprazole  40 mg Oral Q12H   predniSONE  10 mg Oral Q breakfast   senna-docusate  1 tablet Oral QHS   tamsulosin  0.4 mg Oral QHS   torsemide  20 mg Oral BID   traZODone  50 mg Oral QHS   vitamin B-12  500 mcg Oral q AM   Vitamin D (Ergocalciferol)  50,000 Units Oral Q Sat       Physical Exam: Vital signs in last 24 hours: Temp:  [97.9 F (36.6 C)-101.3 F (38.5 C)] 100.7 F (38.2 C) (04/08 0829) Pulse Rate:  [65-97] 97 (04/08 0829) Resp:  [16-20] 18 (04/08 0829) BP: (130-187)/(76-116) 130/81 (04/08 0829) SpO2:  [95 %-100 %] 95 % (04/08 0829) Weight:  [81.6 kg] 81.6 kg (04/07 1610) Weight change:     Intake/Output from previous day: 04/07 0701 - 04/08 0700 In: 240 [P.O.:240] Out: -  Total I/O In: 76.4 [IV Piggyback:76.4] Out: -    Physical Exam: General- pt is awake,alert, oriented to time place and person Resp- No acute REsp distress, CTA B/L NO Rhonchi CVS- S1S2 regular in rate and rhythm GIT- BS+, soft, NT, ND EXT- NO LE Edema, Cyanosis Right lower extremity-right knee effusion, any knee movement causes pain CNS- CN 2-12 grossly intact.  Psych- normal mood and  affect Access- PC   Lab Results: CBC Recent Labs    11/26/21 1807 11/27/21 0559  WBC 6.4 6.1  HGB 12.2* 12.1*  HCT 38.9* 38.4*  PLT 128* 127*    BMET Recent Labs    11/26/21 1807 11/27/21 0559  NA 139 138  K 3.1* 2.8*  CL 101 102  CO2 27 26  GLUCOSE 109* 113*  BUN 20 26*  CREATININE 3.16* 3.91*  CALCIUM 8.3* 8.2*    MICRO Recent Results (from the past 240 hour(s))  Resp Panel by RT-PCR (Flu A&B, Covid) Nasopharyngeal Swab     Status: None   Collection Time: 11/26/21  6:07 PM   Specimen: Nasopharyngeal Swab; Nasopharyngeal(NP) swabs in vial transport medium  Result Value Ref Range Status   SARS Coronavirus 2 by RT PCR NEGATIVE NEGATIVE Final    Comment: (NOTE) SARS-CoV-2 target nucleic acids are NOT DETECTED.  The SARS-CoV-2 RNA is generally detectable in upper respiratory specimens during the acute phase of infection. The lowest concentration of SARS-CoV-2 viral copies this assay can detect is 138 copies/mL. A negative result does not preclude SARS-Cov-2 infection and should not be used as the sole basis for treatment or other patient management decisions. A negative result may occur with  improper specimen collection/handling, submission of specimen other than nasopharyngeal swab, presence of viral mutation(s) within the areas targeted by this assay, and inadequate number of viral copies(<138 copies/mL). A negative result must be combined with clinical observations, patient history, and epidemiological information. The expected result is Negative.  Fact Sheet for Patients:  BloggerCourse.com  Fact Sheet for Healthcare Providers:  SeriousBroker.it  This test is no t yet approved or cleared by the [place] FDA and  has been authorized for detection and/or diagnosis of SARS-CoV-2 by FDA under an Emergency Use Authorization (EUA). This EUA will remain  in effect (meaning this test can be used) for the  duration of the COVID-19 declaration under Section 564(b)(1) of the Act, 21 U.S.C.section 360bbb-3(b)(1), unless the authorization is terminated  or revoked sooner.       Influenza A by PCR NEGATIVE NEGATIVE Final   Influenza B by PCR NEGATIVE NEGATIVE Final    Comment: (NOTE) The Xpert Xpress SARS-CoV-2/FLU/RSV plus assay is intended as an aid in the diagnosis of influenza from Nasopharyngeal swab specimens and should not be used as a sole basis for treatment. Nasal washings and aspirates are unacceptable for Xpert Xpress SARS-CoV-2/FLU/RSV testing.  Fact Sheet for Patients: BloggerCourse.com  Fact Sheet for Healthcare Providers: SeriousBroker.it  This test is not yet approved or cleared by the [place] FDA and has been authorized for detection and/or diagnosis of SARS-CoV-2 by FDA under an Emergency Use Authorization (EUA). This EUA will remain in effect (meaning this test can be used) for the duration of the COVID-19 declaration under Section 564(b)(1) of the Act, 21 U.S.C. section 360bbb-3(b)(1), unless the authorization is terminated or revoked.  Performed at [facility], [address] [place], [place] [address]   Surgical PCR screen     Status: None   Collection Time: 11/27/21  6:49 AM   Specimen: Nasal Mucosa; Nasal Swab  Result Value Ref Range Status   MRSA, PCR NEGATIVE NEGATIVE Final   Staphylococcus aureus NEGATIVE NEGATIVE Final    Comment: (NOTE) The Xpert SA Assay (FDA approved for NASAL specimens in patients 81 years of age and older), is one component of a comprehensive surveillance program. It is not intended to diagnose infection nor to guide or monitor treatment. Performed at [facility], [address] [place], [place] [address]       Lab Results  Component Value Date   CALCIUM 8.2 (L) 11/27/2021   PHOS 4.0 11/27/2021      Impression: 1)Renal    End-stage  renal disease Patient is on hemodialysis on Monday Wednesday Friday schedule I was not  able to find patient's data in care everywhere regarding his Dialysis  As per data reviewed from care everywhere Patient was hospitalized from December 10 till August 03, 2021 at [facility] for hypokalemia, acute kidney injury on chronic disease and volume overload.  Patient was started on renal replacement therapy on July 22, 2021 Patient did undergo renal biopsy at the time.  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  Patient did receive prednisone taper.  Patient is currently on hemodialysis    2)HTN  BP is at goal    3)Anemia of chronic disease HGb at goal (9--11)  4)CKD Mineral-Bone Disorder-Secondary Hyperparathyroidism   PTH elevated ( 271 on 12.19.22)  Secondary Hyperparathyroidism  present     5)Right displaced, depressed lateral tibial plateau fracture Ortho is  following  6)Electrolytes   hypokalemia We will replete  NOrmonatremic   7)Acid base Co2 at goal   8) Left renal mass/Prostate cancer with metastasis to intra-abdominal lymph nodes and bone Patient is being followed by oncologist at [facility]   Plan:  No need for renal placement therapy today      Valgene Deloatch s [facility] 11/27/2021, 2:08 PM

## 2021-11-28 DIAGNOSIS — N186 End stage renal disease: Secondary | ICD-10-CM | POA: Diagnosis not present

## 2021-11-28 DIAGNOSIS — Z992 Dependence on renal dialysis: Secondary | ICD-10-CM

## 2021-11-28 DIAGNOSIS — S82141A Displaced bicondylar fracture of right tibia, initial encounter for closed fracture: Secondary | ICD-10-CM | POA: Diagnosis not present

## 2021-11-28 DIAGNOSIS — I10 Essential (primary) hypertension: Secondary | ICD-10-CM | POA: Diagnosis not present

## 2021-11-28 LAB — BASIC METABOLIC PANEL
Anion gap: 9 (ref 5–15)
BUN: 40 mg/dL — ABNORMAL HIGH (ref 8–23)
CO2: 25 mmol/L (ref 22–32)
Calcium: 7.7 mg/dL — ABNORMAL LOW (ref 8.9–10.3)
Chloride: 105 mmol/L (ref 98–111)
Creatinine, Ser: 4.94 mg/dL — ABNORMAL HIGH (ref 0.61–1.24)
GFR, Estimated: 12 mL/min — ABNORMAL LOW (ref >60)
Glucose, Bld: 128 mg/dL — ABNORMAL HIGH (ref 70–99)
Potassium: 3.9 mmol/L (ref 3.5–5.1)
Sodium: 139 mmol/L (ref 135–145)

## 2021-11-28 LAB — CBC
HCT: 34.5 % — ABNORMAL LOW (ref 39.0–52.0)
Hemoglobin: 10.6 g/dL — ABNORMAL LOW (ref 13.0–17.0)
MCH: 29 pg (ref 26.0–34.0)
MCHC: 30.7 g/dL (ref 30.0–36.0)
MCV: 94.5 fL (ref 80.0–100.0)
Platelets: 98 10*3/uL — ABNORMAL LOW (ref 150–400)
RBC: 3.65 MIL/uL — ABNORMAL LOW (ref 4.22–5.81)
RDW: 15.1 % (ref 11.5–15.5)
WBC: 7.7 10*3/uL (ref 4.0–10.5)
nRBC: 0 % (ref 0.0–0.2)

## 2021-11-28 LAB — GLUCOSE, CAPILLARY
Glucose-Capillary: 117 mg/dL — ABNORMAL HIGH (ref 70–99)
Glucose-Capillary: 141 mg/dL — ABNORMAL HIGH (ref 70–99)
Glucose-Capillary: 165 mg/dL — ABNORMAL HIGH (ref 70–99)
Glucose-Capillary: 143 mg/dL — ABNORMAL HIGH (ref 70–99)

## 2021-11-28 LAB — MAGNESIUM: Magnesium: 1.8 mg/dL (ref 1.7–2.4)

## 2021-11-28 LAB — HEPATITIS B SURFACE ANTIBODY,QUALITATIVE: Hep B S Ab: NONREACTIVE

## 2021-11-28 LAB — PHOSPHORUS: Phosphorus: 4.5 mg/dL (ref 2.5–4.6)

## 2021-11-28 LAB — HEPATITIS B SURFACE ANTIGEN: Hepatitis B Surface Ag: NONREACTIVE

## 2021-11-28 MED ORDER — LIDOCAINE HCL (PF) 1 % IJ SOLN
5.0000 mL | INTRAMUSCULAR | Status: DC | PRN
  Filled 2021-11-28: qty 5

## 2021-11-28 MED ORDER — PENTAFLUOROPROP-TETRAFLUOROETH EX AERO
1.0000 "application " | INHALATION_SPRAY | CUTANEOUS | Status: DC | PRN
  Filled 2021-11-28: qty 30

## 2021-11-28 MED ORDER — SODIUM CHLORIDE 0.9 % IV SOLN: 100.0000 mL | INTRAVENOUS | Status: DC | PRN

## 2021-11-28 MED ORDER — MAGNESIUM SULFATE 2 GM/50ML IV SOLN: 2.0000 g | Freq: Once | INTRAVENOUS | Status: DC

## 2021-11-28 MED ORDER — HEPARIN SODIUM (PORCINE) 1000 UNIT/ML DIALYSIS
1000.0000 [IU] | INTRAMUSCULAR | Status: DC | PRN
  Filled 2021-11-28: qty 1

## 2021-11-28 MED ORDER — TRAMADOL HCL 50 MG PO TABS
50.0000 mg | ORAL_TABLET | Freq: Four times a day (QID) | ORAL | Status: DC | PRN
  Administered 2021-11-30 – 2021-12-02 (×3): 50 mg via ORAL
  Filled 2021-11-28 (×3): qty 1

## 2021-11-28 MED ORDER — CHLORHEXIDINE GLUCONATE CLOTH 2 % EX PADS
6.0000 | MEDICATED_PAD | Freq: Every day | CUTANEOUS | Status: DC
  Administered 2021-11-28 – 2021-12-02 (×5): 6 via TOPICAL

## 2021-11-28 MED ORDER — ALTEPLASE 2 MG IJ SOLR
2.0000 mg | Freq: Once | INTRAMUSCULAR | Status: DC | PRN
  Filled 2021-11-28: qty 2

## 2021-11-28 MED ORDER — CHLORHEXIDINE GLUCONATE CLOTH 2 % EX PADS
6.0000 | MEDICATED_PAD | Freq: Every day | CUTANEOUS | Status: DC
  Administered 2021-11-28 – 2021-12-02 (×3): 6 via TOPICAL

## 2021-11-28 MED ORDER — POTASSIUM CHLORIDE CRYS ER 20 MEQ PO TBCR
20.0000 meq | EXTENDED_RELEASE_TABLET | Freq: Once | ORAL | Status: AC
  Administered 2021-11-28: 20 meq via ORAL
  Filled 2021-11-28: qty 1

## 2021-11-28 MED ORDER — HEPARIN SODIUM (PORCINE) 1000 UNIT/ML IJ SOLN
INTRAMUSCULAR | Status: AC
  Filled 2021-11-28: qty 10

## 2021-11-28 MED ORDER — MAGNESIUM SULFATE IN D5W 1-5 GM/100ML-% IV SOLN
1.0000 g | Freq: Once | INTRAVENOUS | Status: AC
  Administered 2021-11-28: 1 g via INTRAVENOUS
  Filled 2021-11-28: qty 100

## 2021-11-28 MED ORDER — LIDOCAINE-PRILOCAINE 2.5-2.5 % EX CREA: 1.0000 "application " | TOPICAL_CREAM | CUTANEOUS | Status: DC | PRN

## 2021-11-28 NOTE — Progress Notes (Addendum)
PHARMACY CONSULT NOTE ? ?Pharmacy Consult for Electrolyte Monitoring and Replacement  ? ?Recent Labs: ?Potassium (mmol/L)  ?Date Value  ?11/28/2021 3.9  ? ?Magnesium (mg/dL)  ?Date Value  ?11/28/2021 1.8  ? ?Calcium (mg/dL)  ?Date Value  ?11/28/2021 7.7 (L)  ? ?Albumin (g/dL)  ?Date Value  ?11/27/2021 3.4 (L)  ? ?Phosphorus (mg/dL)  ?Date Value  ?11/28/2021 4.5  ? ?Sodium (mmol/L)  ?Date Value  ?11/28/2021 139  ? ?Corrected Ca: 8.2 mg/dL ? ?Assessment: 66 y.o. male with PMH of ESRD on HD MWF, essential hypertension, type 2 diabetes, hyperlipidemia, CAD s/p CABG, HFrEF 35%, duodenal ulcers, history of GI bleed, prostate cancer, with metastasis to bone, left renal mass, who presented to [facility] ED via EMS after a mechanical fall at the hemodialysis center. Pharmacy is asked to follow and replace electrolytes  ? ?Diuretics: torsemide 20 mg po BID ? ?Goal of Therapy:  ?Potassium 4.0 - 5.1 mmol/L ?Magnesium 2.0 - 2.4 mg/dL ?All Other Electrolytes WNL ? ?Plan:  ?Give 20 mEq KCl PO x1  ?1 gram IV magnesium sulfate x 1 ?Recheck electrolytes with AM labs ? ? ?Thank you for allowing pharmacy to be a part of this patient?s care. ? ?Dallie Piles, PharmD, BCPS ?Clinical Pharmacist ?11/28/2021 ?9:27 AM ? ? ?

## 2021-11-28 NOTE — Evaluation (Signed)
Occupational Therapy Evaluation ?Patient Details ?Name: Micheal Aguirre ?MRN: [id number] ?DOB: September 12, 1955 ?Today's Date: 11/28/2021 ? ? ?History of Present Illness 66 y.o. male with medical history significant for ESRD on HD MWF, hypertension, type 2 diabetes, hyperlipidemia, coronary artery disease status post CABG, HFrEF 35%, duodenal ulcers, history of GI bleed, prostate cancer, with metastasis to bone, left renal mass, who presented to [facility] ED via EMS after a mechanical fall at the hemodialysis center. X-ray of knee revealed right lateral tibial plateau fracture and joint effusion.  Pt now s/p ORIF of right lateral tibial plateau fracture 11/27/2021  ? ?Clinical Impression ?  ?Pt seen for OT evaluation this date. Prior to admission, pt was independent in all ADLs (sponge-bathing at baseline) living in an assisted living facility [facility]). Pt currently requires MIN A for bed mobility, SUPERVISION for seated UB ADLs while seated EOB, and MIN GUARD for seated LB bathing. With MOD A+2, pt attempted sit>stand transfer with RW in preparation for sit>stand LB bathing however was unable to achieve upright posture 2/2 RLE pain and decreased balance. Pt declined further OOB mobility attempts d/t RLE pain; plan to coordinate pain medication with RN in subsequent OT sessions. Pt would benefit from additional skilled OT services to maximize return to PLOF and minimize risk of future falls, injury, caregiver burden, and readmission. Upon discharge, recommend SNF.   ? ?Recommendations for follow up therapy are one component of a multi-disciplinary discharge planning process, led by the attending physician.  Recommendations may be updated based on patient status, additional functional criteria and insurance authorization.  ? ?Follow Up Recommendations ? Skilled nursing-short term rehab (<3 hours/day)  ?  ?Assistance Recommended at Discharge Intermittent Supervision/Assistance  ?Patient can return home with the following Two people  to help with walking and/or transfers;A lot of help with bathing/dressing/bathroom ? ?  ?Functional Status Assessment ? Patient has had a recent decline in their functional status and demonstrates the ability to make significant improvements in function in a reasonable and predictable amount of time.  ?Equipment Recommendations ? Other (comment) (defer to next venue of care)  ?  ?   ?Precautions / Restrictions Precautions ?Precautions: Fall ?Required Braces or Orthoses: Knee Immobilizer - Right ?Knee Immobilizer - Right: On at all times;Other (comment) (May remove knee immobilizer for gentle knee ROM.) ?Restrictions ?Weight Bearing Restrictions: Yes ?RLE Weight Bearing: Non weight bearing  ? ?  ? ?Mobility Bed Mobility ?Overal bed mobility: Needs Assistance ?Bed Mobility: Supine to Sit, Sit to Supine ?  ?  ?Supine to sit: Min assist ?Sit to supine: Min assist ?  ?General bed mobility comments: MIN A to manage RLE ?  ? ?Transfers ?Overall transfer level: Needs assistance ?Equipment used: Rolling walker (2 wheels) ?Transfers: Sit to/from Stand ?Sit to Stand: Mod assist, +2 physical assistance ?  ?  ?  ?  ?  ?General transfer comment: Following MOD A+2 to clear hips from bed, pt unable to achieve upright stand 2/2 significant RLE pain ?  ? ?  ?Balance Overall balance assessment: Needs assistance ?Sitting-balance support: No upper extremity supported, Feet supported ?Sitting balance-Leahy Scale: Fair ?Sitting balance - Comments: Requires MIN GUARD for dynamic sitting balance during seated UB/LB bathing ?  ?Standing balance support: Bilateral upper extremity supported, During functional activity, Reliant on assistive device for balance ?Standing balance-Leahy Scale: Zero ?Standing balance comment: Pt unable to achieve upright stance 2/2 significant RLE pain ?  ?  ?  ?  ?  ?  ?  ?  ?  ?  ?  ?   ? ?  ADL either performed or assessed with clinical judgement  ? ?ADL Overall ADL's : Needs assistance/impaired ?  ?  ?Grooming:  Wash/dry hands;Wash/dry face;Supervision/safety;Set up;Sitting ?Grooming Details (indicate cue type and reason): SUPERVISION for completing while sitting EOB ?Upper Body Bathing: Supervision/ safety;Set up;Sitting ?Upper Body Bathing Details (indicate cue type and reason): SUPERVISION for completing while sitting EOB ?Lower Body Bathing: Min guard;Sitting/lateral leans ?Lower Body Bathing Details (indicate cue type and reason): Pt attempted sit>stand LB bathing, however unable to achieve upright posture 2/2 significant RLE pain. Requires MIN GUARD for dynamic sitting balance ?Upper Body Dressing : Supervision/safety;Set up;Sitting ?Upper Body Dressing Details (indicate cue type and reason): to don/doff hospital gown ?Lower Body Dressing: Moderate assistance;Sitting/lateral leans ?Lower Body Dressing Details (indicate cue type and reason): to don socks ?  ?  ?  ?  ?  ?  ?  ?   ? ? ? ?Vision Baseline Vision/History: 1 Wears glasses ?Ability to See in Adequate Light: 0 Adequate ?Patient Visual Report: No change from baseline ?   ?   ?   ?   ? ?Pertinent Vitals/Pain Pain Assessment ?Pain Assessment: 0-10 ?Pain Score: 9  ?Pain Location: R Knee ?Pain Descriptors / Indicators: Aching, Tender, Grimacing ?Pain Intervention(s): Monitored during session, Repositioned, RN gave pain meds during session  ? ? ? ?Hand Dominance Right ?  ?Extremity/Trunk Assessment Upper Extremity Assessment ?Upper Extremity Assessment: Overall WFL for tasks assessed ?  ?Lower Extremity Assessment ?Lower Extremity Assessment: RLE deficits/detail ?RLE Deficits / Details: s/p ORIF of right lateral tibial plateau fracture ?RLE: Unable to fully assess due to pain ?  ?  ?  ?Communication Communication ?Communication: No difficulties ?  ?Cognition Arousal/Alertness: Awake/alert ?Behavior During Therapy: [facility] for tasks assessed/performed ?Overall Cognitive Status: Within Functional Limits for tasks assessed ?  ?  ?  ?  ?  ?  ?  ?  ?  ?  ?  ?  ?  ?  ?  ?  ?   ?  ?  ?   ?   ?   ? ? ?Home Living Family/patient expects to be discharged to:: Skilled nursing facility ?  ?  ?  ?  ?  ?  ?  ?  ?  ?  ?  ?  ?  ?  ?Home Equipment: Rollator (4 wheels);Cane - single point ?  ?Additional Comments: At baseline, pt lives in an assistive living facility [facility]) ?  ? ?  ?Prior Functioning/Environment Prior Level of Function : Independent/Modified Independent ?  ?  ?  ?  ?  ?  ?Mobility Comments: Aside from this fall, pt denies any other falls within the past 6 months ?ADLs Comments: Pt reports being independent with all ADLs at baseline (spongebathing at baseline) ?  ? ?  ?  ?OT Problem List: Decreased strength;Decreased activity tolerance;Decreased range of motion;Impaired balance (sitting and/or standing);Decreased knowledge of precautions;Pain ?  ?   ?OT Treatment/Interventions: Self-care/ADL training;Therapeutic exercise;DME and/or AE instruction;Therapeutic activities;Patient/family education;Balance training  ?  ?OT Goals(Current goals can be found in the care plan section) Acute Rehab OT Goals ?Patient Stated Goal: to have less pain ?OT Goal Formulation: With patient ?Time For Goal Achievement: 12/12/21 ?Potential to Achieve Goals: Good ?ADL Goals ?Pt Will Perform Grooming: with min assist;standing ?Pt Will Transfer to Toilet: with min assist;stand pivot transfer;bedside commode ?Pt Will Perform Toileting - Clothing Manipulation and hygiene: with min assist;sit to/from stand  ?OT Frequency: Min 2X/week ?  ? ?   ?AM-PAC  OT "6 Clicks" Daily Activity     ?Outcome Measure Help from another person eating meals?: None ?Help from another person taking care of personal grooming?: A Little ?Help from another person toileting, which includes using toliet, bedpan, or urinal?: A Lot ?Help from another person bathing (including washing, rinsing, drying)?: A Little ?Help from another person to put on and taking off regular upper body clothing?: A Little ?Help from another person to put  on and taking off regular lower body clothing?: A Lot ?6 Click Score: 17 ?  ?End of Session Equipment Utilized During Treatment: Rolling walker (2 wheels);Right knee immobilizer ?Nurse Communication: Neta Mends

## 2021-11-28 NOTE — Progress Notes (Signed)
Hemodialysis Post Treatment Note ? ?28 November 2021 ? ?Access: Right Subclavian Catheter ? ?Treatment Time: 2 Hrs. 39 mins.  ? ?UF Removed: 1501 ml ? ?Next Scheduled Treatment: 12/01/21 ? ?Pt. completes treatment without incident, initially required bolus of NS due to low pressure, which rebounded with fluid. Catheter functions well, maintains blood flow rate, no signs of infection, dressing changed. Labs drawn per order, no meds given. Report given to primary nurse. VSS, pt alert and talkative.  ?

## 2021-11-28 NOTE — Plan of Care (Signed)
?  Problem: Education: ?Goal: Knowledge of General Education information will improve ?Description: Including pain rating scale, medication(s)/side effects and non-pharmacologic comfort measures ?Outcome: Progressing ?  ?Problem: Health Behavior/Discharge Planning: ?Goal: Ability to manage health-related needs will improve ?Outcome: Progressing ?  ?Problem: Clinical Measurements: ?Goal: Ability to maintain clinical measurements within normal limits will improve ?Outcome: Progressing ?  ?Problem: Clinical Measurements: ?Goal: Will remain free from infection ?Outcome: Progressing ?  ?Problem: Clinical Measurements: ?Goal: Diagnostic test results will improve ?Outcome: Progressing ?  ?Problem: Clinical Measurements: ?Goal: Cardiovascular complication will be avoided ?Outcome: Progressing ?  ?Problem: Nutrition: ?Goal: Adequate nutrition will be maintained ?Outcome: Progressing ?  ?Problem: Pain Managment: ?Goal: General experience of comfort will improve ?Outcome: Progressing ?  ?Problem: Safety: ?Goal: Ability to remain free from injury will improve ?Outcome: Progressing ?  ?Problem: Skin Integrity: ?Goal: Risk for impaired skin integrity will decrease ?Outcome: Progressing ?  ?

## 2021-11-28 NOTE — Progress Notes (Signed)
[place] [facility] ? ? ?PATIENT NAME: Micheal Aguirre   ? ?MR#:  [id number] ? ?DATE OF BIRTH:  28-Mar-1956 ? ?SUBJECTIVE:  ? ?patient came in after having a mechanical fall in between dialysis session while going to the bathroom. Patient started having knee pain found to have tibial plateau fracture. ?No Family at bedside ? ? ?VITALS:  ?Blood pressure 108/67, pulse 87, temperature 97.9 ?F (36.6 ?C), temperature source Oral, resp. rate 15, height '5\' 9"'$  (1.753 m), weight 83.5 kg, SpO2 99 %. ? ?PHYSICAL EXAMINATION:  ? ?GENERAL:  66 y.o.-year-old patient lying in the bed with no acute distress.  ?LUNGS: Normal breath sounds bilaterally, no wheezing, rales, rhonchi.  ?CARDIOVASCULAR: S1, S2 normal. No murmurs, rubs, or gallops.  ?ABDOMEN: Soft, nontender, nondistended. Bowel sounds present.  ?EXTREMITIES: right knee splint+. HD access ?NEUROLOGIC: nonfocal  patient is alert and awake ? ?LABORATORY PANEL:  ?CBC ?Recent Labs  ?Lab 11/28/21 ?0428  ?WBC 7.7  ?HGB 10.6*  ?HCT 34.5*  ?PLT 98*  ? ? ? ?Chemistries  ?Recent Labs  ?Lab 11/27/21 ?0559 11/27/21 ?1354 11/28/21 ?0428  ?NA 138   < > 139  ?K 2.8*   < > 3.9  ?CL 102   < > 105  ?CO2 26   < > 25  ?GLUCOSE 113*   < > 128*  ?BUN 26*   < > 40*  ?CREATININE 3.91*   < > 4.94*  ?CALCIUM 8.2*   < > 7.7*  ?MG 1.9  --  1.8  ?AST 20  --   --   ?ALT 18  --   --   ?ALKPHOS 116  --   --   ?BILITOT 0.6  --   --   ? < > = values in this interval not displayed.  ? ? ?Cardiac Enzymes ?No results for input(s): TROPONINI in the last 168 hours. ?RADIOLOGY:  ?DG Knee 1-2 Views Right ? ?Result Date: 11/27/2021 ?CLINICAL DATA:  Status post Right knee ORIF. EXAM: RIGHT KNEE - 1-2 VIEW COMPARISON:  CT knee 11/26/2021 FINDINGS: Status post plate and screw fixation of the proximal right tibia. Old healed fibular head and neck fracture. No evidence of fracture, dislocation, or joint effusion. No evidence of arthropathy or other focal bone abnormality. Severe soft tissue  edema and emphysema consistent with postsurgical changes. Overlying skin staples. Vascular calcifications. IMPRESSION: Status post plate and screw fixation of the proximal right tibia. Electronically Signed   By: Iven Finn M.D.   On: 11/27/2021 17:50  ? ?DG Tibia/Fibula Right ? ?Result Date: 11/27/2021 ?CLINICAL DATA:  ORIF, right tibial fracture. EXAM: OPERATIVE RIGHT KNEE 3 VIEWS TECHNIQUE: Fluoroscopic spot image(s) were submitted for interpretation post-operatively. Fluoro time, 1 minute and 4 seconds.  Radiation dose: 4.64 mGy. COMPARISON:  11/26/2021. FINDINGS: Submitted images show placement of a lateral fixation plate and multiple associated screws reducing the lateral tibial plateau fracture. Fracture appears to be in near anatomic alignment. Orthopedic hardware appears well seated. IMPRESSION: Well aligned proximal tibial fracture following ORIF. Electronically Signed   By: Lajean Manes M.D.   On: 11/27/2021 16:57  ? ?DG Ankle Complete Right ? ?Result Date: 11/26/2021 ?CLINICAL DATA:  Trauma, pain EXAM: RIGHT ANKLE - COMPLETE 3+ VIEW COMPARISON:  None. FINDINGS: No recent fracture or dislocation is seen. Osteopenia is seen in bony structures. Small bony spurs seen in the distal tibia. Bony spurs are noted in the dorsal aspect of intertarsal and tarsometatarsal joints in the lateral view.  There is possible tiny plantar spur in the calcaneus. There is soft tissue swelling around the ankle, more so over the lateral malleolus. IMPRESSION: No recent fracture or dislocation is seen in the right ankle. Degenerative changes are noted in multiple joints as described in the body of the report. There is possible small plantar spur in the calcaneus. Electronically Signed   By: Elmer Picker M.D.   On: 11/26/2021 17:59  ? ?CT Cervical Spine Wo Contrast ? ?Result Date: 11/26/2021 ?CLINICAL DATA:  Neck trauma EXAM: CT CERVICAL SPINE WITHOUT CONTRAST TECHNIQUE: Multidetector CT imaging of the cervical spine was  performed without intravenous contrast. Multiplanar CT image reconstructions were also generated. RADIATION DOSE REDUCTION: This exam was performed according to the departmental dose-optimization program which includes automated exposure control, adjustment of the mA and/or kV according to patient size and/or use of iterative reconstruction technique. COMPARISON:  None. FINDINGS: Alignment: Straightening and mild reversal of the normal cervical lordosis, with trace anterolisthesis of C4 on C5 and trace retrolisthesis of C5 on C6, which appears degenerative. Skull base and vertebrae: No acute fracture. No primary bone lesion or focal pathologic process. Osteopenia. Soft tissues and spinal canal: No prevertebral fluid or swelling. No visible canal hematoma. Vascular calcifications. Disc levels: Multilevel degenerative changes, with mild spinal canal stenosis at C4-C5 and C5-C6. Multilevel uncovertebral and facet arthropathy, which causes severe left neural foraminal narrowing at C4-C5 and moderate to severe neural foraminal narrowing on the right at C5-C6. Upper chest: No focal pulmonary opacity or pleural effusion. Other: None. IMPRESSION: No acute fracture or traumatic listhesis in the cervical spine. Electronically Signed   By: Merilyn Baba M.D.   On: 11/26/2021 19:10  ? ?CT Knee Right Wo Contrast ? ?Result Date: 11/26/2021 ?CLINICAL DATA:  Knee trauma.  Tibial plateau fracture. EXAM: CT OF THE RIGHT KNEE WITHOUT CONTRAST 3-DIMENSIONAL CT IMAGE RENDERING ON ACQUISITION WORKSTATION TECHNIQUE: Multidetector CT imaging of the right knee was performed according to the standard protocol. Multiplanar CT image reconstructions were also generated. 3-dimensional CT images were rendered by post-processing of the original CT data on an acquisition workstation. The 3-dimensional CT images were interpreted and findings were reported in the accompanying complete CT report for this study RADIATION DOSE REDUCTION: This exam was  performed according to the departmental dose-optimization program which includes automated exposure control, adjustment of the mA and/or kV according to patient size and/or use of iterative reconstruction technique. COMPARISON:  Radiographs same date FINDINGS: Bones/Joint/Cartilage The bones are demineralized. There is a markedly impacted intra-articular fracture of the lateral tibial plateau with impaction of the articular surface by up to 2 cm anteriorly. There is distal extension of a nondisplaced fracture into the lateral aspect of the proximal tibial metadiaphysis. There is central extension of the intra-articular component of the fracture with probable undermining of the tibial spines which appear nondisplaced. No definite fracture of the medial tibial plateau. The distal femur and patella appear intact. There is posttraumatic deformity of the proximal fibula with chronic periosteal reaction. There is a large lipohemarthrosis. Ligaments Suboptimally assessed by CT. Muscles and Tendons The extensor mechanism is intact. Mild generalized muscular atrophy without focal intramuscular fluid collection. Soft tissues Moderate-sized Baker's cyst. There is a well-circumscribed soft tissue nodule posteromedially in the distal thigh which has a peripheral rim of high density which may reflect calcification. This appears nonacute. Femoropopliteal atherosclerosis noted. IMPRESSION: 1. Markedly impacted intra-articular fracture of the lateral tibial plateau as described. This fracture demonstrates distal extension of a nondisplaced component,  and there is probable undermining of both tibial spines. No involvement of the medial tibial plateau. 2. Posttraumatic deformity of the proximal fibula. 3. Large lipohemarthrosis with moderate-sized Baker's cyst. 4. Soft tissue nodule posteriorly in the distal thigh appears nonacute and may reflect a lymph node or sequela of prior trauma. This has a nonaggressive appearance.  Electronically Signed   By: Richardean Sale M.D.   On: 11/26/2021 19:22  ? ?CT 3D RECON AT SCANNER ? ?Result Date: 11/26/2021 ?CLINICAL DATA:  Knee trauma.  Tibial plateau fracture. EXAM: CT OF THE RIGHT KNEE WITHOUT CONTRA

## 2021-11-28 NOTE — Progress Notes (Signed)
Micheal Aguirre  MRN: [id number]  DOB/AGE: 1956-01-20 66 y.o.  Primary Care Physician:Pcp, No  Admit date: 11/26/2021  Chief Complaint:  Chief Complaint  Patient presents with   Fall    S-Pt presented on  11/26/2021 with  Chief Complaint  Patient presents with   Fall  . Patient was seen today on first floor Patient states he is feeling better than yesterday  Medications    acetaminophen  1,000 mg Oral Q6H   amLODipine  5 mg Oral Daily   atorvastatin  80 mg Oral Daily   calcium carbonate  500 mg Oral BID   carvedilol  25 mg Oral BID WC   Chlorhexidine Gluconate Cloth  6 each Topical Daily   docusate sodium  100 mg Oral BID   folic acid  1 mg Oral Daily   gabapentin  100 mg Oral Daily   heparin  5,000 Units Subcutaneous Q8H   insulin aspart  0-5 Units Subcutaneous QHS   insulin aspart  0-6 Units Subcutaneous TID WC   multivitamin with minerals  1 tablet Oral QPM   oxybutynin  5 mg Oral TID   pantoprazole  40 mg Oral Q12H   potassium chloride  20 mEq Oral Once   predniSONE  10 mg Oral Q breakfast   senna-docusate  1 tablet Oral QHS   tamsulosin  0.4 mg Oral QHS   torsemide  20 mg Oral BID   traMADol  50 mg Oral Q6H   traZODone  50 mg Oral QHS   vitamin B-12  500 mcg Oral q AM   Vitamin D (Ergocalciferol)  50,000 Units Oral Q Sat         JXB:JYNWG from the symptoms mentioned above,there are no other symptoms referable to all systems reviewed.  Physical Exam: Vital signs in last 24 hours: Temp:  [98.7 F (37.1 C)-99.7 F (37.6 C)] 99.7 F (37.6 C) (04/09 0814) Pulse Rate:  [50-82] 80 (04/09 0814) Resp:  [11-18] 15 (04/09 0814) BP: (113-153)/(78-101) 113/78 (04/09 0814) SpO2:  [95 %-99 %] 97 % (04/09 0814) Weight change:  Last BM Date : 11/27/21  Intake/Output from previous day: 04/08 0701 - 04/09 0700 In: 1605.2 [P.O.:60; I.V.:1068.7; IV Piggyback:476.4] Out: 295 [Urine:220; Blood:75] Total I/O In: 364 [P.O.:240; I.V.:124] Out: -    Physical  Exam:  General- pt is awake,alert, oriented to time place and person  Resp- No acute REsp distress, CTA B/L NO Rhonchi  CVS- S1S2 regular in rate and rhythm  GIT- BS+, soft, Non tender , Non distended  EXT- No Left LE Edema,  No Cyanosis Right lower extremity-trace edema   Access-tunneled cath   Lab Results:  CBC  Recent Labs    11/27/21 0559 11/28/21 0428  WBC 6.1 7.7  HGB 12.1* 10.6*  HCT 38.4* 34.5*  PLT 127* 98*    BMET  Recent Labs    11/27/21 1354 11/28/21 0428  NA 141 139  K 3.2* 3.9  CL 105 105  CO2 26 25  GLUCOSE 123* 128*  BUN 31* 40*  CREATININE 4.10* 4.94*  CALCIUM 8.4* 7.7*      Most recent Creatinine trend  Lab Results  Component Value Date   CREATININE 4.94 (H) 11/28/2021   CREATININE 4.10 (H) 11/27/2021   CREATININE 3.91 (H) 11/27/2021      MICRO   Recent Results (from the past 240 hour(s))  Resp Panel by RT-PCR (Flu A&B, Covid) Nasopharyngeal Swab     Status: None   Collection Time: 11/26/21  6:07 PM   Specimen: Nasopharyngeal Swab; Nasopharyngeal(NP) swabs in vial transport medium  Result Value Ref Range Status   SARS Coronavirus 2 by RT PCR NEGATIVE NEGATIVE Final    Comment: (NOTE) SARS-CoV-2 target nucleic acids are NOT DETECTED.  The SARS-CoV-2 RNA is generally detectable in upper respiratory specimens during the acute phase of infection. The lowest concentration of SARS-CoV-2 viral copies this assay can detect is 138 copies/mL. A negative result does not preclude SARS-Cov-2 infection and should not be used as the sole basis for treatment or other patient management decisions. A negative result may occur with  improper specimen collection/handling, submission of specimen other than nasopharyngeal swab, presence of viral mutation(s) within the areas targeted by this assay, and inadequate number of viral copies(<138 copies/mL). A negative result must be combined with clinical observations, patient history, and  epidemiological information. The expected result is Negative.  Fact Sheet for Patients:  BloggerCourse.com  Fact Sheet for Healthcare Providers:  SeriousBroker.it  This test is no t yet approved or cleared by the [place] FDA and  has been authorized for detection and/or diagnosis of SARS-CoV-2 by FDA under an Emergency Use Authorization (EUA). This EUA will remain  in effect (meaning this test can be used) for the duration of the COVID-19 declaration under Section 564(b)(1) of the Act, 21 U.S.C.section 360bbb-3(b)(1), unless the authorization is terminated  or revoked sooner.       Influenza A by PCR NEGATIVE NEGATIVE Final   Influenza B by PCR NEGATIVE NEGATIVE Final    Comment: (NOTE) The Xpert Xpress SARS-CoV-2/FLU/RSV plus assay is intended as an aid in the diagnosis of influenza from Nasopharyngeal swab specimens and should not be used as a sole basis for treatment. Nasal washings and aspirates are unacceptable for Xpert Xpress SARS-CoV-2/FLU/RSV testing.  Fact Sheet for Patients: BloggerCourse.com  Fact Sheet for Healthcare Providers: SeriousBroker.it  This test is not yet approved or cleared by the [place] FDA and has been authorized for detection and/or diagnosis of SARS-CoV-2 by FDA under an Emergency Use Authorization (EUA). This EUA will remain in effect (meaning this test can be used) for the duration of the COVID-19 declaration under Section 564(b)(1) of the Act, 21 U.S.C. section 360bbb-3(b)(1), unless the authorization is terminated or revoked.  Performed at [facility], [address] [place], [place] [address]   Surgical PCR screen     Status: None   Collection Time: 11/27/21  6:49 AM   Specimen: Nasal Mucosa; Nasal Swab  Result Value Ref Range Status   MRSA, PCR NEGATIVE NEGATIVE Final   Staphylococcus aureus NEGATIVE NEGATIVE  Final    Comment: (NOTE) The Xpert SA Assay (FDA approved for NASAL specimens in patients 43 years of age and older), is one component of a comprehensive surveillance program. It is not intended to diagnose infection nor to guide or monitor treatment. Performed at [facility], [address] [place], [place] [address]          Impression:   1)Renal    End-stage renal disease Patient is on hemodialysis Patient is a Monday Wednesday Friday schedule No need for renal replacement therapy today We will dialyze patient in the morning  2)HTN    Blood pressure is stable    3)Anemia of chronic disease     Latest Ref Rng & Units 11/28/2021    4:28 AM 11/27/2021    5:59 AM 11/26/2021    6:07 PM  CBC  WBC 4.0 - 10.5 K/uL 7.7  6.1   6.4    Hemoglobin 13.0 - 17.0 g/dL 16.1   09.6   04.5    Hematocrit 39.0 - 52.0 % 34.5   38.4   38.9    Platelets 150 - 400 K/uL 98   127   128         HGb at goal (9--11)   4) Secondary hyperparathyroidism -CKD Mineral-Bone Disorder    Lab Results  Component Value Date   CALCIUM 7.7 (L) 11/28/2021   PHOS 4.5 11/28/2021    Secondary Hyperparathyroidism Present Patient has intact PTH levels of 271 on August 09, 2021  Phosphorus at goal.   5)Right displaced, depressed lateral tibial plateau fracture  now s/p open reduction and internal fixation Orthopedics is following   6) Electrolytes      Latest Ref Rng & Units 11/28/2021    4:28 AM 11/27/2021    1:54 PM 11/27/2021    5:59 AM  BMP  Glucose 70 - 99 mg/dL 409   811   914    BUN 8 - 23 mg/dL 40   31   26    Creatinine 0.61 - 1.24 mg/dL 7.82   9.56   2.13    Sodium 135 - 145 mmol/L 139   141   138    Potassium 3.5 - 5.1 mmol/L 3.9   3.2   2.8    Chloride 98 - 111 mmol/L 105   105   102    CO2 22 - 32 mmol/L 25   26   26     Calcium 8.9 - 10.3 mg/dL 7.7   8.4   8.2       Sodium Normonatremic   Potassium Hypokalemia Now better    7)Acid base    Co2 at  goal     Plan:  No need for renal placement therapy today Patient potassium is better Patient hemoglobin is stable We will dialyze patient tomorrow Will not use any heparin We will keep patient on Epogen      Merel Santoli s Kailene Steinhart 11/28/2021, 11:06 AM

## 2021-11-28 NOTE — Progress Notes (Signed)
Cardiac monitor showing afib. Rate controlled. Patient stated he has a history of afib. No acute distress noted. No complaints from patient at this time. ?

## 2021-11-28 NOTE — Evaluation (Signed)
Physical Therapy Evaluation ?Patient Details ?Name: Micheal Aguirre ?MRN: [id number] ?DOB: 10/25/1955 ?Today's Date: 11/28/2021 ? ?History of Present Illness ? 66 y.o. male with medical history significant for ESRD on HD MWF, hypertension, type 2 diabetes, hyperlipidemia, coronary artery disease status post CABG, HFrEF 35%, duodenal ulcers, history of GI bleed, prostate cancer, with metastasis to bone, left renal mass, who presented to [facility] ED via EMS after a mechanical fall at the hemodialysis center. X-ray of knee revealed right lateral tibial plateau fracture and joint effusion.  Pt now s/p ORIF of right lateral tibial plateau fracture 11/27/2021 ?  ?Clinical Impression ? Patient was alert and oriented during PT evaluation today. He reports very pain limited today and agreeable to evaluation but declined to attempt sitting up at EOB, transfer, or walking today. He states he is hurting and too fatigued to attempt despite prompting. He presents with good UE and Left LE strength during manual muscle testing and presents with KI on Right LE and NWB status on right. He winced in pain with initial movement of right LE yet did improve and able to demo some active movement but too weak to lift his leg up or to side without assistance. Evaluation was limited and patient will require further assessment including ability to transfer and walk/hop next session. Pt would benefit from additional skilled PT services to maximize return to PLOF and minimize risk of future falls, injury, caregiver burden, and readmission. Upon discharge, recommend SNF.  ?   ? ?Recommendations for follow up therapy are one component of a multi-disciplinary discharge planning process, led by the attending physician.  Recommendations may be updated based on patient status, additional functional criteria and insurance authorization. ? ?Follow Up Recommendations Skilled nursing-short term rehab (<3 hours/day) ? ?  ?Assistance Recommended at Discharge Frequent or  constant Supervision/Assistance  ?Patient can return home with the following ? Two people to help with walking and/or transfers;Assist for transportation;Direct supervision/assist for financial management;Direct supervision/assist for medications management;Assistance with cooking/housework;Two people to help with bathing/dressing/bathroom ? ?  ?Equipment Recommendations    ?Recommendations for Other Services ?    ?  ?Functional Status Assessment Patient has had a recent decline in their functional status and demonstrates the ability to make significant improvements in function in a reasonable and predictable amount of time.  ? ?  ?Precautions / Restrictions Precautions ?Precautions: Fall ?Required Braces or Orthoses: Knee Immobilizer - Right ?Knee Immobilizer - Right: On at all times;Other (comment) (May remove knee immobilizer for gentle knee ROM.) ?Restrictions ?Weight Bearing Restrictions: Yes ?RLE Weight Bearing: Non weight bearing  ? ?  ? ?Mobility ? Bed Mobility ?Overal bed mobility: Needs Assistance (Patient declined to perform any bed mobility- either OOB or to EOB with PT today.) ?Bed Mobility:  (Patient declined to attempt OOB with PT or even sit at EOB.) ?  ?  ?  ?  ?  ?General bed mobility comments:  (Patient declined to perform any bed mobility- either OOB or to EOB with PT today.) ?  ? ?Transfers ?  ?  ?  ?  ?  ?  ?  ?  ?  ?General transfer comment: Patient declined to perform any bed mobility- either OOB or to EOB with PT today. ?  ? ?Ambulation/Gait ?Ambulation/Gait assistance:  (Patient declined to attempt to stand or ambulate today.) ?  ?  ?  ?  ?  ?  ?General Gait Details: Patient declined to perform any transfers or gait today. ? ?Stairs ?  ?  ?  ?  ?  ? ?  Wheelchair Mobility ?  ? ?Modified Rankin (Stroke Patients Only) ?  ? ?  ? ?Balance Overall balance assessment:  (Unable to assess seated or standing balance today secondary to patient declined activities- requesting to stay in bed.) ?  ?  ?  ?   ?  ?  ?  ?  ?  ?  ?  ?  ?  ?  ?  ?  ?  ?  ?   ? ? ? ?Pertinent Vitals/Pain Pain Assessment ?Pain Assessment: 0-10 ?Pain Score: 8  ?Pain Location: Right knee ?Pain Descriptors / Indicators: Aching, Sore, Shooting ?Pain Intervention(s): Limited activity within patient's tolerance, Monitored during session, Premedicated before session, Repositioned  ? ? ?Home Living Family/patient expects to be discharged to:: Skilled nursing facility ?  ?  ?  ?  ?  ?  ?  ?  ?Home Equipment: Rollator (4 wheels);Cane - single Barista (2 wheels) ?Additional Comments: At baseline, pt lives in an assistive living facility [facility])  ?  ?Prior Function Prior Level of Function : Independent/Modified Independent ?  ?  ?  ?  ?  ?  ?Mobility Comments: Aside from this fall, pt denies any other falls within the past 6 months ?ADLs Comments: Pt reports being independent with all ADLs at baseline (spongebathing at baseline) ?  ? ? ?Hand Dominance  ? Dominant Hand: Right ? ?  ?Extremity/Trunk Assessment  ? Upper Extremity Assessment ?Upper Extremity Assessment: Overall WFL for tasks assessed ?  ? ?Lower Extremity Assessment ?Lower Extremity Assessment: RLE deficits/detail ?RLE Deficits / Details: s/p ORIF of right lateral tibial plateau fracture- In Immobilizer- ?RLE: Unable to fully assess due to pain;Unable to fully assess due to immobilization ?RLE Sensation: WNL ?RLE Coordination: WNL ?  ? ?Cervical / Trunk Assessment ?Cervical / Trunk Assessment: Normal  ?Communication  ? Communication: No difficulties  ?Cognition Arousal/Alertness: Awake/alert ?Behavior During Therapy: [facility] for tasks assessed/performed ?Overall Cognitive Status: Within Functional Limits for tasks assessed ?  ?  ?  ?  ?  ?  ?  ?  ?  ?  ?  ?  ?  ?  ?  ?  ?  ?  ?  ? ?  ?General Comments   ? ?  ?Exercises General Exercises - Lower Extremity ?Ankle Circles/Pumps: AROM, Both, 10 reps, Supine ?Quad Sets: AROM, Left, 10 reps, Supine ?Hip ABduction/ADduction: AAROM,  Right, 10 reps, Supine  ? ?Assessment/Plan  ?  ?PT Assessment Patient needs continued PT services  ?PT Problem List Decreased strength;Decreased range of motion;Decreased activity tolerance;Decreased balance;Decreased mobility;Decreased coordination;Decreased safety awareness;Pain;Decreased skin integrity ? ?   ?  ?PT Treatment Interventions DME instruction;Gait training;Stair training;Functional mobility training;Therapeutic activities;Therapeutic exercise;Balance training;Neuromuscular re-education;Patient/family education;Modalities   ? ?PT Goals (Current goals can be found in the Care Plan section)  ?Acute Rehab PT Goals ?Patient Stated Goal: Get my leg healed and feel better; decrease pain; and walk ?PT Goal Formulation: With patient ?Time For Goal Achievement: 12/12/21 ?Potential to Achieve Goals: Fair ? ?  ?Frequency BID ?  ? ? ?Co-evaluation   ?  ?  ?  ?  ? ? ?  ?AM-PAC PT "6 Clicks" Mobility  ?Outcome Measure Help needed turning from your back to your side while in a flat bed without using bedrails?: A Lot ?Help needed moving from lying on your back to sitting on the side of a flat bed without using bedrails?: A Lot ?Help needed moving to and from a bed to a chair (including  a wheelchair)?: A Lot ?Help needed standing up from a chair using your arms (e.g., wheelchair or bedside chair)?: A Lot ?Help needed to walk in hospital room?: A Lot ?Help needed climbing 3-5 steps with a railing? : A Lot ?6 Click Score: 12 ? ?  ?End of Session Equipment Utilized During Treatment: Gait belt;Right knee immobilizer ?Activity Tolerance: Patient limited by pain ?Patient left: in bed;with call bell/phone within reach;with bed alarm set ?Nurse Communication: Mobility status;Precautions;Weight bearing status ?PT Visit Diagnosis: Unsteadiness on feet (R26.81);Other abnormalities of gait and mobility (R26.89);Muscle weakness (generalized) (M62.81);History of falling (Z91.81);Difficulty in walking, not elsewhere classified  (R26.2);Pain ?Pain - Right/Left: Right ?Pain - part of body: Leg;Knee ?  ? ?Time: 2671-2458 ?PT Time Calculation (min) (ACUTE ONLY): 26 min ? ? ?Charges:   PT Evaluation ?$PT Eval Low Complexity: 1 Low ?PT T

## 2021-11-28 NOTE — Anesthesia Postprocedure Evaluation (Signed)
Anesthesia Post Note ? ?Patient: Micheal Aguirre ? ?Procedure(s) Performed: OPEN REDUCTION INTERNAL FIXATION (ORIF) TIBIA FRACTURE (Right: Leg Lower) ? ?Patient location during evaluation: PACU ?Anesthesia Type: General ?Level of consciousness: awake and alert ?Pain management: pain level controlled ?Vital Signs Assessment: post-procedure vital signs reviewed and stable ?Respiratory status: spontaneous breathing, nonlabored ventilation, respiratory function stable and patient connected to nasal cannula oxygen ?Cardiovascular status: blood pressure returned to baseline and stable ?Postop Assessment: no apparent nausea or vomiting ?Anesthetic complications: no ? ? ?No notable events documented. ? ? ?Last Vitals:  ?Vitals:  ? 11/27/21 1821 11/27/21 2041  ?BP: (!) 153/93 137/82  ?Pulse: 60 64  ?Resp: 14 18  ?Temp: 37.1 ?C 37.6 ?C  ?SpO2: 99% 97%  ?  ?Last Pain:  ?Vitals:  ? 11/27/21 2346  ?TempSrc:   ?PainSc: 6   ? ? ?  ?  ?  ?  ?  ?  ? ?Arita Miss ? ? ? ? ?

## 2021-11-29 ENCOUNTER — Encounter: Payer: Self-pay | Admitting: Orthopedic Surgery

## 2021-11-29 LAB — GLUCOSE, CAPILLARY
Glucose-Capillary: 109 mg/dL — ABNORMAL HIGH (ref 70–99)
Glucose-Capillary: 117 mg/dL — ABNORMAL HIGH (ref 70–99)
Glucose-Capillary: 123 mg/dL — ABNORMAL HIGH (ref 70–99)
Glucose-Capillary: 142 mg/dL — ABNORMAL HIGH (ref 70–99)

## 2021-11-29 LAB — BASIC METABOLIC PANEL
Anion gap: 9 (ref 5–15)
BUN: 31 mg/dL — ABNORMAL HIGH (ref 8–23)
CO2: 27 mmol/L (ref 22–32)
Calcium: 7.7 mg/dL — ABNORMAL LOW (ref 8.9–10.3)
Chloride: 102 mmol/L (ref 98–111)
Creatinine, Ser: 4.1 mg/dL — ABNORMAL HIGH (ref 0.61–1.24)
GFR, Estimated: 15 mL/min — ABNORMAL LOW (ref >60)
Glucose, Bld: 116 mg/dL — ABNORMAL HIGH (ref 70–99)
Potassium: 3.8 mmol/L (ref 3.5–5.1)
Sodium: 138 mmol/L (ref 135–145)

## 2021-11-29 LAB — CBC
HCT: 30 % — ABNORMAL LOW (ref 39.0–52.0)
Hemoglobin: 9.3 g/dL — ABNORMAL LOW (ref 13.0–17.0)
MCH: 28.7 pg (ref 26.0–34.0)
MCHC: 31 g/dL (ref 30.0–36.0)
MCV: 92.6 fL (ref 80.0–100.0)
Platelets: 87 10*3/uL — ABNORMAL LOW (ref 150–400)
RBC: 3.24 MIL/uL — ABNORMAL LOW (ref 4.22–5.81)
RDW: 15.4 % (ref 11.5–15.5)
WBC: 6.9 10*3/uL (ref 4.0–10.5)
nRBC: 0 % (ref 0.0–0.2)

## 2021-11-29 LAB — MAGNESIUM: Magnesium: 2.1 mg/dL (ref 1.7–2.4)

## 2021-11-29 MED ORDER — HEPARIN SODIUM (PORCINE) 5000 UNIT/ML IJ SOLN: 5000.0000 [IU] | Freq: Two times a day (BID) | INTRAMUSCULAR | Status: DC

## 2021-11-29 MED ORDER — ASPIRIN EC 81 MG PO TBEC
81.0000 mg | DELAYED_RELEASE_TABLET | Freq: Two times a day (BID) | ORAL | Status: DC
Start: 2021-11-29 — End: 2021-12-02
  Administered 2021-11-29 – 2021-12-02 (×6): 81 mg via ORAL
  Filled 2021-11-29 (×6): qty 1

## 2021-11-29 MED ORDER — POTASSIUM CHLORIDE CRYS ER 20 MEQ PO TBCR
20.0000 meq | EXTENDED_RELEASE_TABLET | Freq: Once | ORAL | Status: AC
  Administered 2021-11-29: 20 meq via ORAL
  Filled 2021-11-29: qty 1

## 2021-11-29 NOTE — Progress Notes (Signed)
Micheal Aguirre ? ?MRN: [id number] ? ?DOB/AGE: [birth year] 66 y.o. ? ?Primary Care Physician:Pcp, No ? ?Admit date: 11/26/2021 ? ?Chief Complaint:  ?Chief Complaint  ?Patient presents with  ? Fall  ? ? ?S-Pt presented on  11/26/2021 with  ?Chief Complaint  ?Patient presents with  ? Fall  ?. ? ?Update: ?Patient seen seated in chair ?Complains of fatigue after therapy session ?Tolerating meals without nausea and vomiting ?Remains on room air ?Immobilizer in place on Right leg ? ? ?Medications ? ? ? amLODipine  5 mg Oral Daily  ? atorvastatin  80 mg Oral Daily  ? calcium carbonate  500 mg Oral BID  ? carvedilol  25 mg Oral BID WC  ? Chlorhexidine Gluconate Cloth  6 each Topical Daily  ? Chlorhexidine Gluconate Cloth  6 each Topical Q0600  ? docusate sodium  100 mg Oral BID  ? folic acid  1 mg Oral Daily  ? gabapentin  100 mg Oral Daily  ? heparin  5,000 Units Subcutaneous Q8H  ? insulin aspart  0-5 Units Subcutaneous QHS  ? insulin aspart  0-6 Units Subcutaneous TID WC  ? multivitamin with minerals  1 tablet Oral QPM  ? oxybutynin  5 mg Oral TID  ? pantoprazole  40 mg Oral Q12H  ? predniSONE  10 mg Oral Q breakfast  ? senna-docusate  1 tablet Oral QHS  ? tamsulosin  0.4 mg Oral QHS  ? torsemide  20 mg Oral BID  ? traZODone  50 mg Oral QHS  ? vitamin B-12  500 mcg Oral q AM  ? Vitamin D (Ergocalciferol)  50,000 Units Oral Q Sat  ? ? ? ? ? ? ? ?LEX:NTZGY from the symptoms mentioned above,there are no other symptoms referable to all systems reviewed. ? ?Physical Exam: ?Vital signs in last 24 hours: ?Temp:  [97.7 ?F (36.5 ?C)-99.9 ?F (37.7 ?C)] 99.9 ?F (37.7 ?C) (04/10 1150) ?Pulse Rate:  [59-118] 80 (04/10 1000) ?Resp:  [9-20] 20 (04/10 1000) ?BP: (75-123)/(47-96) 110/70 (04/10 1000) ?SpO2:  [96 %-100 %] 99 % (04/10 1000) ?Weight:  [82.5 kg-83.5 kg] 82.5 kg (04/09 1645) ?Weight change:  ?Last BM Date : 11/27/21 ? ?Intake/Output from previous day: ?04/09 0701 - 04/10 0700 ?In: 364 [P.O.:240; I.V.:124] ?Out: 1001  ?Total I/O ?In: 370  [P.O.:370] ?Out: 100 [Urine:100] ? ? ?Physical Exam: ? ?General- pt is awake,alert, oriented to time place and person ? ?Resp- No acute REsp distress, CTA B/L NO Rhonchi ? ?CVS- S1S2 regular in rate and rhythm ? ?GIT- BS+, soft, Non tender , Non distended ? ?EXT- No Left LE Edema,  No Cyanosis ?Right lower extremity-trace edema, immobilizer ? ? ?Access-Rt tunneled cath  ? ?Lab Results: ? ?CBC ? ?Recent Labs  ?  11/28/21 ?0428 11/29/21 ?1749  ?WBC 7.7 6.9  ?HGB 10.6* 9.3*  ?HCT 34.5* 30.0*  ?PLT 98* 87*  ? ? ? ?BMET ? ?Recent Labs  ?  11/28/21 ?0428 11/29/21 ?4496  ?NA 139 138  ?K 3.9 3.8  ?CL 105 102  ?CO2 25 27  ?GLUCOSE 128* 116*  ?BUN 40* 31*  ?CREATININE 4.94* 4.10*  ?CALCIUM 7.7* 7.7*  ? ? ? ? ? ?Most recent Creatinine trend  ?Lab Results  ?Component Value Date  ? CREATININE 4.10 (H) 11/29/2021  ? CREATININE 4.94 (H) 11/28/2021  ? CREATININE 4.10 (H) 11/27/2021  ? ?  ? ? ?MICRO ? ? ?Recent Results (from the past 240 hour(s))  ?Resp Panel by RT-PCR (Flu A&B, Covid) Nasopharyngeal Swab  Status: None  ? Collection Time: 11/26/21  6:07 PM  ? Specimen: Nasopharyngeal Swab; Nasopharyngeal(NP) swabs in vial transport medium  ?Result Value Ref Range Status  ? SARS Coronavirus 2 by RT PCR NEGATIVE NEGATIVE Final  ?  Comment: (NOTE) ?SARS-CoV-2 target nucleic acids are NOT DETECTED. ? ?The SARS-CoV-2 RNA is generally detectable in upper respiratory ?specimens during the acute phase of infection. The lowest ?concentration of SARS-CoV-2 viral copies this assay can detect is ?138 copies/mL. A negative result does not preclude SARS-Cov-2 ?infection and should not be used as the sole basis for treatment or ?other patient management decisions. A negative result may occur with  ?improper specimen collection/handling, submission of specimen other ?than nasopharyngeal swab, presence of viral mutation(s) within the ?areas targeted by this assay, and inadequate number of viral ?copies(<138 copies/mL). A negative result must be  combined with ?clinical observations, patient history, and epidemiological ?information. The expected result is Negative. ? ?Fact Sheet for Patients:  ?EntrepreneurPulse.com.au ? ?Fact Sheet for Healthcare Providers:  ?IncredibleEmployment.be ? ?This test is no t yet approved or cleared by the [place] FDA and  ?has been authorized for detection and/or diagnosis of SARS-CoV-2 by ?FDA under an Emergency Use Authorization (EUA). This EUA will remain  ?in effect (meaning this test can be used) for the duration of the ?COVID-19 declaration under Section 564(b)(1) of the Act, 21 ?U.S.C.section 360bbb-3(b)(1), unless the authorization is terminated  ?or revoked sooner.  ? ? ?  ? Influenza A by PCR NEGATIVE NEGATIVE Final  ? Influenza B by PCR NEGATIVE NEGATIVE Final  ?  Comment: (NOTE) ?The Xpert Xpress SARS-CoV-2/FLU/RSV plus assay is intended as an aid ?in the diagnosis of influenza from Nasopharyngeal swab specimens and ?should not be used as a sole basis for treatment. Nasal washings and ?aspirates are unacceptable for Xpert Xpress SARS-CoV-2/FLU/RSV ?testing. ? ?Fact Sheet for Patients: ?EntrepreneurPulse.com.au ? ?Fact Sheet for Healthcare Providers: ?IncredibleEmployment.be ? ?This test is not yet approved or cleared by the [place] FDA and ?has been authorized for detection and/or diagnosis of SARS-CoV-2 by ?FDA under an Emergency Use Authorization (EUA). This EUA will remain ?in effect (meaning this test can be used) for the duration of the ?COVID-19 declaration under Section 564(b)(1) of the Act, 21 U.S.C. ?section 360bbb-3(b)(1), unless the authorization is terminated or ?revoked. ? ?Performed at [facility], [place], ?[place] [address] ?  ?Surgical PCR screen     Status: None  ? Collection Time: 11/27/21  6:49 AM  ? Specimen: Nasal Mucosa; Nasal Swab  ?Result Value Ref Range Status  ? MRSA, PCR NEGATIVE  NEGATIVE Final  ? Staphylococcus aureus NEGATIVE NEGATIVE Final  ?  Comment: (NOTE) ?The Xpert SA Assay (FDA approved for NASAL specimens in patients 24 ?years of age and older), is one component of a comprehensive ?surveillance program. It is not intended to diagnose infection nor to ?guide or monitor treatment. ?Performed at [facility], [place], ?[place] [address] ?  ?  ? ? ? ? ? ?Impression: ? ?[facility]/MWF/ Rt Permcath ? ?1)End stage renal disease on hemodialysis. Received treatment yesterday, UF 1L achieved. Due to treatment yesterday, we will hold treatment today and offer short treatment tomorrow.  ? ? ? ?2)Hypertension with chronic kidney disease. Home regimen includes amlodipine, carvedilol, and torsemide. Currently receiving these medications inpatient.  ? BP stable 110/70 ? ?3)Anemia of chronic disease ? ? ?  Latest Ref Rng & Units 11/29/2021  ?  3:49  AM 11/28/2021  ?  4:28 AM 11/27/2021  ?  5:59 AM  ?CBC  ?WBC 4.0 - 10.5 K/uL 6.9   7.7   6.1    ?Hemoglobin 13.0 - 17.0 g/dL 9.3   10.6   12.1    ?Hematocrit 39.0 - 52.0 % 30.0   34.5   38.4    ?Platelets 150 - 400 K/uL 87   98   127    ?  ? HGb at goal (9--11) ?Hemoglobin 9.3, within acceptable range.  ? ? ?4) Secondary hyperparathyroidism -CKD Mineral-Bone Disorder ? ?Lab Results  ?Component Value Date  ? CALCIUM 7.7 (L) 11/29/2021  ? PHOS 4.5 11/28/2021  ? ? ?Secondary Hyperparathyroidism Present ?Patient has intact PTH levels of 271 on August 09, 2021 ? ?Calcium decreased, but phosphorus at goal. Calcium carbonate prescribed.  ? ? ?5)Right displaced, depressed lateral tibial plateau fracture  ?now s/p open reduction and internal fixation ?Orthopedics is following ? ?6) Diabetes mellitus type II with chronic kidney disease ?insulin dependent. Home regimen includes Lantus and Lispro. Most recent hemoglobin A1c is 5.6 on 11/27/21.  ? Glucose stable. Primary team to manage SSI ? ? ? ?[place] ?11/29/2021, 12:10 PM ? ?

## 2021-11-29 NOTE — TOC Progression Note (Signed)
Transition of Care (TOC) - Progression Note  ? ? ?Patient Details  ?Name: Micheal Aguirre ?MRN: [id number] ?Date of Birth: 21-Nov-1955 ? ?Transition of Care (TOC) CM/SW Contact  ?Conception Oms, RN ?Phone Number: ?11/29/2021, 1:15 PM ? ?Clinical Narrative:   Spoke with the daughter Yaakov Guthrie she provided the [place] [phone], group number [phone] ? ?I provided the bed offer from [facility], She requested I also look in [place], I sent the bed search out to [facility] in [place] and [facility] in [place] ? ?Expected Discharge Plan: [place] ?Barriers to Discharge: SNF Pending bed offer ? ?Expected Discharge Plan and Services ?Expected Discharge Plan: [place] ?  ?  ?  ?  ?                ?  ?  ?  ?  ?  ?  ?  ?  ?  ?  ? ? ?Social Determinants of Health (SDOH) Interventions ?  ? ?Readmission Risk Interventions ?   ? View : No data to display.  ?  ?  ?  ? ? ?

## 2021-11-29 NOTE — Progress Notes (Signed)
?Subjective: ? ?POD #2 s/p ORIF of the right lateral tibial plateau fracture.  Patient is up out of bed to a chair.  Patient reports right leg pain as moderate.  Patient was seen by me yesterday while in dialysis.  There has been no significant change in his clinical condition overnight. ? ?Objective:  ? ?VITALS:   ?Vitals:  ? 11/29/21 1000 11/29/21 1150 11/29/21 1233 11/29/21 1412  ?BP: 110/70   130/80  ?Pulse: 80   (!) 102  ?Resp: 20   20  ?Temp: 99.6 ?F (37.6 ?C) 99.9 ?F (37.7 ?C) 98.6 ?F (37 ?C)   ?TempSrc: Oral Oral Oral   ?SpO2: 99%   100%  ?Weight:      ?Height:      ? ? ?PHYSICAL EXAM: ?Right lower extremity: ?I personally change the patient's dressing today with the help of his nurse, Estill Bamberg.  Patient had sanguinous drainage on his dressings.  Patient has sanguinous oozing from his incision.  Patient has lower leg edema but his compartments are soft and compressible.  He has intact sensation to light touch throughout the right lower extremity but still has persistent weakness with dorsiflexion of the right ankle and extension of his toes which was present upon presentation to the ER. ? ? ?LABS ? ?Results for orders placed or performed during the hospital encounter of 11/26/21 (from the past 24 hour(s))  ?Glucose, capillary     Status: Abnormal  ? Collection Time: 11/28/21  5:57 PM  ?Result Value Ref Range  ? Glucose-Capillary 165 (H) 70 - 99 mg/dL  ?Glucose, capillary     Status: Abnormal  ? Collection Time: 11/28/21  9:12 PM  ?Result Value Ref Range  ? Glucose-Capillary 143 (H) 70 - 99 mg/dL  ? Comment 1 Notify RN   ?CBC     Status: Abnormal  ? Collection Time: 11/29/21  3:49 AM  ?Result Value Ref Range  ? WBC 6.9 4.0 - 10.5 K/uL  ? RBC 3.24 (L) 4.22 - 5.81 MIL/uL  ? Hemoglobin 9.3 (L) 13.0 - 17.0 g/dL  ? HCT 30.0 (L) 39.0 - 52.0 %  ? MCV 92.6 80.0 - 100.0 fL  ? MCH 28.7 26.0 - 34.0 pg  ? MCHC 31.0 30.0 - 36.0 g/dL  ? RDW 15.4 11.5 - 15.5 %  ? Platelets 87 (L) 150 - 400 K/uL  ? nRBC 0.0 0.0 - 0.2 %   ?Basic metabolic panel     Status: Abnormal  ? Collection Time: 11/29/21  3:49 AM  ?Result Value Ref Range  ? Sodium 138 135 - 145 mmol/L  ? Potassium 3.8 3.5 - 5.1 mmol/L  ? Chloride 102 98 - 111 mmol/L  ? CO2 27 22 - 32 mmol/L  ? Glucose, Bld 116 (H) 70 - 99 mg/dL  ? BUN 31 (H) 8 - 23 mg/dL  ? Creatinine, Ser 4.10 (H) 0.61 - 1.24 mg/dL  ? Calcium 7.7 (L) 8.9 - 10.3 mg/dL  ? GFR, Estimated 15 (L) >60 mL/min  ? Anion gap 9 5 - 15  ?Magnesium     Status: None  ? Collection Time: 11/29/21  3:49 AM  ?Result Value Ref Range  ? Magnesium 2.1 1.7 - 2.4 mg/dL  ?Glucose, capillary     Status: Abnormal  ? Collection Time: 11/29/21  7:27 AM  ?Result Value Ref Range  ? Glucose-Capillary 109 (H) 70 - 99 mg/dL  ?Glucose, capillary     Status: Abnormal  ? Collection Time: 11/29/21 11:23 AM  ?Result  Value Ref Range  ? Glucose-Capillary 117 (H) 70 - 99 mg/dL  ? ? ?DG Knee 1-2 Views Right ? ?Result Date: 11/27/2021 ?CLINICAL DATA:  Status post Right knee ORIF. EXAM: RIGHT KNEE - 1-2 VIEW COMPARISON:  CT knee 11/26/2021 FINDINGS: Status post plate and screw fixation of the proximal right tibia. Old healed fibular head and neck fracture. No evidence of fracture, dislocation, or joint effusion. No evidence of arthropathy or other focal bone abnormality. Severe soft tissue edema and emphysema consistent with postsurgical changes. Overlying skin staples. Vascular calcifications. IMPRESSION: Status post plate and screw fixation of the proximal right tibia. Electronically Signed   By: Iven Finn M.D.   On: 11/27/2021 17:50  ? ?DG Tibia/Fibula Right ? ?Result Date: 11/27/2021 ?CLINICAL DATA:  ORIF, right tibial fracture. EXAM: OPERATIVE RIGHT KNEE 3 VIEWS TECHNIQUE: Fluoroscopic spot image(s) were submitted for interpretation post-operatively. Fluoro time, 1 minute and 4 seconds.  Radiation dose: 4.64 mGy. COMPARISON:  11/26/2021. FINDINGS: Submitted images show placement of a lateral fixation plate and multiple associated screws reducing  the lateral tibial plateau fracture. Fracture appears to be in near anatomic alignment. Orthopedic hardware appears well seated. IMPRESSION: Well aligned proximal tibial fracture following ORIF. Electronically Signed   By: Lajean Manes M.D.   On: 11/27/2021 16:57  ? ?DG C-Arm 1-60 Min-No Report ? ?Result Date: 11/27/2021 ?CLINICAL DATA:  ORIF, right tibial fracture. EXAM: OPERATIVE RIGHT KNEE 3 VIEWS TECHNIQUE: Fluoroscopic spot image(s) were submitted for interpretation post-operatively. Fluoro time, 1 minute and 4 seconds.  Radiation dose: 4.64 mGy. COMPARISON:  11/26/2021. FINDINGS: Submitted images show placement of a lateral fixation plate and multiple associated screws reducing the lateral tibial plateau fracture. Fracture appears to be in near anatomic alignment. Orthopedic hardware appears well seated. IMPRESSION: Well aligned proximal tibial fracture following ORIF. Electronically Signed   By: Lajean Manes M.D.   On: 11/27/2021 16:57   ? ?Assessment/Plan: ?2 Days Post-Op  ? ?Principal Problem: ?  Tibial plateau fracture ?Active Problems: ?  ESRD on dialysis [facility]) ?  Primary hypertension ? ?I have spoken with Dr. Posey Pronto.  Patient will stop his heparin and will be switched to Lovenox once a day.  Patient will continue with physical and Occupational Therapy.  Patient will continue to elevate the right lower extremity.  Patient would benefit from thigh-high Ted stockings on the right leg once his edema has improved.  Patient is nonweightbearing the right lower extremity.  He will require skilled nursing facility upon discharge.  Continue right knee immobilizer at all times except for physical therapy. ? ? ? ?Thornton Park , MD ?11/29/2021, 2:21 PM ? ? ? ? ?  ?

## 2021-11-29 NOTE — TOC Progression Note (Signed)
Transition of Care (TOC) - Progression Note  ? ? ?Patient Details  ?Name: Micheal Aguirre ?MRN: [id number] ?Date of Birth: 11/06/1955 ? ?Transition of Care (TOC) CM/SW Contact  ?Conception Oms, RN ?Phone Number: ?11/29/2021, 12:58 PM ? ?Clinical Narrative:   Spoke with the patient's daughter to obtain Ins information, She will call me back with the Ins number ? ? ? ?Expected Discharge Plan: [place] ?Barriers to Discharge: SNF Pending bed offer ? ?Expected Discharge Plan and Services ?Expected Discharge Plan: [place] ?  ?  ?  ?  ?                ?  ?  ?  ?  ?  ?  ?  ?  ?  ?  ? ? ?Social Determinants of Health (SDOH) Interventions ?  ? ?Readmission Risk Interventions ?   ? View : No data to display.  ?  ?  ?  ? ? ?

## 2021-11-29 NOTE — Plan of Care (Signed)

## 2021-11-29 NOTE — Progress Notes (Signed)
[place] at [facility] ? ? ?PATIENT NAME: Micheal Aguirre   ? ?MR#:  [id number] ? ?DATE OF BIRTH:  1955/12/12 ? ?SUBJECTIVE:  ? ?patient came in after having a mechanical fall in between dialysis session while going to the bathroom. Patient started having knee pain found to have tibial plateau fracture. ? ?Patient out in the chair eating [place]. Denies any complaints. Told me it was rough working with PT. No family at bedside. ? ?VITALS:  ?Blood pressure 130/80, pulse (!) 102, temperature 98.6 ?F (37 ?C), temperature source Oral, resp. rate 20, height '5\' 9"'$  (1.753 m), weight 82.5 kg, SpO2 100 %. ? ?PHYSICAL EXAMINATION:  ? ?GENERAL:  66 y.o.-year-old patient lying in the bed with no acute distress.  ?LUNGS: Normal breath sounds bilaterally, no wheezing, rales, rhonchi.  ?CARDIOVASCULAR: S1, S2 normal. No murmurs, rubs, or gallops.  ?ABDOMEN: Soft, nontender, nondistended. Bowel sounds present.  ?EXTREMITIES: right knee splint+. HD access ?NEUROLOGIC: nonfocal  patient is alert and awake ? ?LABORATORY PANEL:  ?CBC ?Recent Labs  ?Lab 11/29/21 ?7341  ?WBC 6.9  ?HGB 9.3*  ?HCT 30.0*  ?PLT 87*  ? ? ? ?Chemistries  ?Recent Labs  ?Lab 11/27/21 ?0559 11/27/21 ?1354 11/29/21 ?9379  ?NA 138   < > 138  ?K 2.8*   < > 3.8  ?CL 102   < > 102  ?CO2 26   < > 27  ?GLUCOSE 113*   < > 116*  ?BUN 26*   < > 31*  ?CREATININE 3.91*   < > 4.10*  ?CALCIUM 8.2*   < > 7.7*  ?MG 1.9   < > 2.1  ?AST 20  --   --   ?ALT 18  --   --   ?ALKPHOS 116  --   --   ?BILITOT 0.6  --   --   ? < > = values in this interval not displayed.  ? ? ?Cardiac Enzymes ?No results for input(s): TROPONINI in the last 168 hours. ?RADIOLOGY:  ?DG Knee 1-2 Views Right ? ?Result Date: 11/27/2021 ?CLINICAL DATA:  Status post Right knee ORIF. EXAM: RIGHT KNEE - 1-2 VIEW COMPARISON:  CT knee 11/26/2021 FINDINGS: Status post plate and screw fixation of the proximal right tibia. Old healed fibular head and neck fracture. No evidence of fracture,  dislocation, or joint effusion. No evidence of arthropathy or other focal bone abnormality. Severe soft tissue edema and emphysema consistent with postsurgical changes. Overlying skin staples. Vascular calcifications. IMPRESSION: Status post plate and screw fixation of the proximal right tibia. Electronically Signed   By: Iven Finn M.D.   On: 11/27/2021 17:50  ? ?DG Tibia/Fibula Right ? ?Result Date: 11/27/2021 ?CLINICAL DATA:  ORIF, right tibial fracture. EXAM: OPERATIVE RIGHT KNEE 3 VIEWS TECHNIQUE: Fluoroscopic spot image(s) were submitted for interpretation post-operatively. Fluoro time, 1 minute and 4 seconds.  Radiation dose: 4.64 mGy. COMPARISON:  11/26/2021. FINDINGS: Submitted images show placement of a lateral fixation plate and multiple associated screws reducing the lateral tibial plateau fracture. Fracture appears to be in near anatomic alignment. Orthopedic hardware appears well seated. IMPRESSION: Well aligned proximal tibial fracture following ORIF. Electronically Signed   By: Lajean Manes M.D.   On: 11/27/2021 16:57  ? ?DG C-Arm 1-60 Min-No Report ? ?Result Date: 11/27/2021 ?CLINICAL DATA:  ORIF, right tibial fracture. EXAM: OPERATIVE RIGHT KNEE 3 VIEWS TECHNIQUE: Fluoroscopic spot image(s) were submitted for interpretation post-operatively. Fluoro time, 1 minute and 4 seconds.  Radiation dose: 4.64  mGy. COMPARISON:  11/26/2021. FINDINGS: Submitted images show placement of a lateral fixation plate and multiple associated screws reducing the lateral tibial plateau fracture. Fracture appears to be in near anatomic alignment. Orthopedic hardware appears well seated. IMPRESSION: Well aligned proximal tibial fracture following ORIF. Electronically Signed   By: Lajean Manes M.D.   On: 11/27/2021 16:57   ? ?Assessment and Plan ? ?Micheal Aguirre is a 66 y.o. male with medical history significant for ESRD on HD MWF, essential hypertension, type 2 diabetes, hyperlipidemia, coronary artery disease status post  CABG, HFrEF 35%, duodenal ulcers, history of GI bleed, prostate cancer, with metastasis to bone, left renal mass, who presented to [facility] ED via EMS after a mechanical fall at the hemodialysis center today.  Immediately after his fall he incurred right knee pain with significant edema to his right knee. ? ?Right knee x-ray revealed right lateral tibial plateau fracture and joint effusion.  CT scan confirmed the same. ? ?Right lateral tibial plateau fracture post mechanical fall present on ?-- history of known prostate cancer with metastases to bone ?-- fracture seen on x-ray and confirmed by CT scan ?-- orthopedic consultation with Dr. Mack Guise. Plans for surgery today. This was discussed with patient's daughter Micheal Aguirre over the phone. Daughter wanted me to try transfer to [facility]. [facility] declined transfer. Daughter informed. ?-- PRN pain meds ?-- DVT prophylaxis after surgery per ortho ?--POD#1 worked with PT--recommends rehab ?--POD#2-- worked with physical therapy. Per Dr. Mack Guise oozing at incisional site. Decreased heparin to subcu BID for DVT prophylaxis ? ?ESRD on hemodialysis ?-- nephrology consultation placed for in-house dialysis ? ?chronic systolic congestive heart failure with EF of 35% ?--Euvolemic ?--Volume status managed with hemodialysis ?--Resume home cardiac medications ? ?Essential hypertension ?--Resume home regimen. ? ?Type 2 diabetes, euglycemic. ?--Avoid hypoglycemia in the setting of ESRD ?-- sugars are stable just continue sliding scale insulin ?  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 ?Procedures: ?Family communication : left a voicemail for daughter Micheal Aguirre on the phone 4/10 ?Consults : orthopedic Dr. Mack Guise ?CODE STATUS: full ?DVT Prophylaxis : per ortho ?Level of care: Med-Surg ?Status is: Inpatient ?Remains inpatient appropriate because: left tibial fracture s/p surgery ?TOC  for d/c planning to SNF once bed available and outpatient dialysis coordinated. ?  ? ?TOTAL TIME TAKING CARE OF THIS PATIENT: 25 minutes.  ?>50% time spent on counselling and coordination of care ? ?Note: This dictation was prepared with Dragon dictation along with smaller phrase technology. Any transcriptional errors that result from this process are unintentional. ? ?Fritzi Mandes M.D  ? ? ?[facility]  ? ?CC: ?Primary care physician; Pcp, No  ?

## 2021-11-29 NOTE — Progress Notes (Signed)
Physical Therapy Treatment ?Patient Details ?Name: Micheal Aguirre ?MRN: [id number] ?DOB: [birth year] ?Today's Date: 11/29/2021 ? ? ?History of Present Illness 66 y.o. male with medical history significant for ESRD on HD MWF, hypertension, type 2 diabetes, hyperlipidemia, coronary artery disease status post CABG, HFrEF 35%, duodenal ulcers, history of GI bleed, prostate cancer, with metastasis to bone, left renal mass, who presented to [facility] ED via EMS after a mechanical fall at the hemodialysis center. X-ray of knee revealed right lateral tibial plateau fracture and joint effusion.  Pt now s/p ORIF of right lateral tibial plateau fracture 11/27/2021 ? ?  ?PT Comments  ? ? Pt seen for PT tx with pt requesting to stay in recliner vs returning to bed, pt also requesting to work on exercises. Pt performs RLE strengthening exercises as noted below with PT doffing/donning R KI to allow pt to perform gentle ROM. Pt with increased pain with touching/movement around R knee. Some blood also noted to ace wrap & nurse notified & assessed -- appears to be old blood. Reviewed RLE heel cord stretching with use of sheet (30 seconds, 3 repetitions) & not to perform resistive exercises with therabands at this time 2/2 NWB RLE precautions. Will continue to follow pt acutely to progress mobility as able. ?   ?Recommendations for follow up therapy are one component of a multi-disciplinary discharge planning process, led by the attending physician.  Recommendations may be updated based on patient status, additional functional criteria and insurance authorization. ? ?Follow Up Recommendations ? Skilled nursing-short term rehab (<3 hours/day) ?  ?  ?Assistance Recommended at Discharge Frequent or constant Supervision/Assistance  ?Patient can return home with the following Two people to help with walking and/or transfers;A lot of help with bathing/dressing/bathroom;Assist for transportation;Assistance with cooking/housework;Help with stairs or ramp  for entrance ?  ?Equipment Recommendations ? Wheelchair (measurements PT);Wheelchair cushion (measurements PT) (elevating leg rests)  ?  ?Recommendations for Other Services   ? ? ?  ?Precautions / Restrictions Precautions ?Precautions: Fall ?Required Braces or Orthoses: Knee Immobilizer - Right ?Knee Immobilizer - Right: On at all times;Other (comment) (can remove for gentle ROM) ?Restrictions ?Weight Bearing Restrictions: Yes ?RLE Weight Bearing: Non weight bearing  ?  ? ?Mobility ? Bed Mobility ?Overal bed mobility: Needs Assistance ?Bed Mobility: Supine to Sit ?  ?  ?Supine to sit: Mod assist, HOB elevated ?  ?  ?General bed mobility comments: pt requests to stay in recliner vs returning to bed & work on exercises ?  ? ?Transfers ?Overall transfer level: Needs assistance ?Equipment used: Rolling walker (2 wheels) ?Transfers: Sit to/from Stand, Bed to chair/wheelchair/BSC ?Sit to Stand: Max assist, +2 physical assistance, From elevated surface (Cuing for hand placement, STS from elevated EOB, PT supporting RLE to help limit weight bearing through extremity) ?Stand pivot transfers: Mod assist, +2 physical assistance (stand pivot bed>recliner with RW & +2 assist with pt scooting L foot along floor, PT supporting RLE to assist with maintaining NWB RLE) ?  ?  ?  ?  ?  ?  ? ?Ambulation/Gait ?  ?  ?  ?  ?  ?  ?  ?  ? ? ?Stairs ?  ?  ?  ?  ?  ? ? ?Wheelchair Mobility ?  ? ?Modified Rankin (Stroke Patients Only) ?  ? ? ?  ?Balance Overall balance assessment: Needs assistance ?Sitting-balance support: Bilateral upper extremity supported, Feet supported ?Sitting balance-Leahy Scale: Fair ?Sitting balance - Comments: supervision static sitting ?  ?Standing balance support:  Bilateral upper extremity supported, During functional activity, Reliant on assistive device for balance ?Standing balance-Leahy Scale: Poor ?Standing balance comment: BUE support on RW ?  ?  ?  ?  ?  ?  ?  ?  ?  ?  ?  ?  ? ?  ?Cognition Arousal/Alertness:  Awake/alert ?Behavior During Therapy: [facility] for tasks assessed/performed ?Overall Cognitive Status: Within Functional Limits for tasks assessed ?  ?  ?  ?  ?  ?  ?  ?  ?  ?  ?  ?  ?  ?  ?  ?  ?General Comments: sweet man, asking appropriate questions ?  ?  ? ?  ?Exercises General Exercises - Lower Extremity ?Ankle Circles/Pumps: AAROM, Right, 10 reps ?Quad Sets: AROM, Strengthening, Right, 10 reps ?Heel Slides: AAROM, Strengthening, Right, 10 reps ?Hip ABduction/ADduction: AAROM, Right, 10 reps, Supine, Strengthening ?Straight Leg Raises: AAROM, Supine, Strengthening, Right, 10 reps ? ?  ?General Comments   ?  ?  ? ?Pertinent Vitals/Pain Pain Assessment ?Pain Assessment: Faces ?Pain Score: 8  ?Faces Pain Scale: Hurts whole lot ?Pain Location: R knee ?Pain Descriptors / Indicators: Sore, Grimacing, Discomfort ?Pain Intervention(s): Monitored during session, Repositioned  ? ? ?Home Living   ?  ?  ?  ?  ?  ?  ?  ?  ?  ?   ?  ?Prior Function    ?  ?  ?   ? ?PT Goals (current goals can now be found in the care plan section) Acute Rehab PT Goals ?Patient Stated Goal: Get my leg healed and feel better; decrease pain; and walk ?PT Goal Formulation: With patient ?Time For Goal Achievement: 12/12/21 ?Potential to Achieve Goals: Fair ?Progress towards PT goals: Progressing toward goals ? ?  ?Frequency ? ? ? BID ? ? ? ?  ?PT Plan Current plan remains appropriate  ? ? ?Co-evaluation   ?  ?  ?  ?  ? ?  ?AM-PAC PT "6 Clicks" Mobility   ?Outcome Measure ? Help needed turning from your back to your side while in a flat bed without using bedrails?: A Little ?Help needed moving from lying on your back to sitting on the side of a flat bed without using bedrails?: A Lot ?Help needed moving to and from a bed to a chair (including a wheelchair)?: Total ?Help needed standing up from a chair using your arms (e.g., wheelchair or bedside chair)?: Total ?Help needed to walk in hospital room?: Total ?Help needed climbing 3-5 steps with a railing?  : Total ?6 Click Score: 9 ? ?  ?End of Session Equipment Utilized During Treatment: Right knee immobilizer ?Activity Tolerance: Patient limited by pain;Patient tolerated treatment well ?Patient left: with SCD's reapplied;in chair;with call bell/phone within reach;with nursing/sitter in room (SCD on LLE) ?Nurse Communication: Mobility status;Weight bearing status ?PT Visit Diagnosis: Unsteadiness on feet (R26.81);Other abnormalities of gait and mobility (R26.89);Muscle weakness (generalized) (M62.81);Difficulty in walking, not elsewhere classified (R26.2);Pain ?Pain - Right/Left: Right ?  ? ? ?Time: 9024-0973 ?PT Time Calculation (min) (ACUTE ONLY): 20 min ? ?Charges:  $Therapeutic Exercise: 8-22 mins          ?          ? ?Lavone Nian, PT, DPT ?11/29/21, 3:22 PM ? ? ? ?Waunita Schooner ?11/29/2021, 3:21 PM ? ?

## 2021-11-29 NOTE — Progress Notes (Signed)
Hemodialysis patient known at [place] MWF 10am. Per clinic, patient resides at [facility] and is pleasantly confused at baseline.   ?

## 2021-11-29 NOTE — [place] FL2 (Signed)
?[place] MEDICAID FL2 LEVEL OF CARE SCREENING TOOL  ?  ? ?IDENTIFICATION  ?Patient Name: ?Micheal Aguirre Birthdate: 12-02-1955 Sex: male Admission Date (Current Location): ?11/26/2021  ?[place] and [place] Number: ? [place] ?  Facility and Address:  ?[facility], [address] [place], [place] [address] ?     Provider Number: ?[id number]  ?Attending Physician Name and Address:  ?Fritzi Mandes, MD ? Relative Name and Phone Number:  ?Claris Pong daughter [phone] ?   ?Current Level of Care: ?Hospital Recommended Level of Care: ?[place] Prior Approval Number: ?  ? ?Date Approved/Denied: ?  PASRR Number: ?[id number] A ? ?Discharge Plan: ?SNF ?  ? ?Current Diagnoses: ?Patient Active Problem List  ? Diagnosis Date Noted  ? ESRD on dialysis [facility])   ? Primary hypertension   ? Tibial plateau fracture 11/26/2021  ? ? ?Orientation RESPIRATION BLADDER Height & Weight   ?  ?Self, Time, Situation, Place ? Normal Continent Weight: 82.5 kg ?Height:  '5\' 9"'$  (175.3 cm)  ?BEHAVIORAL SYMPTOMS/MOOD NEUROLOGICAL BOWEL NUTRITION STATUS  ?    Continent Diet (see DC summary)  ?AMBULATORY STATUS COMMUNICATION OF NEEDS Skin   ?Extensive Assist Verbally Normal, Surgical wounds ?  ?  ?  ?    ?     ?     ? ? ?Personal Care Assistance Level of Assistance  ?Bathing, Feeding, Dressing Bathing Assistance: Limited assistance ?Feeding assistance: Limited assistance ?Dressing Assistance: Maximum assistance ?   ? ?Functional Limitations Info  ?    ?  ?   ? ? ?SPECIAL CARE FACTORS FREQUENCY  ?PT (By licensed PT), OT (By licensed OT)   ?  ?PT Frequency: 5 times a week ?OT Frequency: 5 times a week ?  ?  ?  ?   ? ? ?Contractures Contractures Info: Not present  ? ? ?Additional Factors Info  ?Code Status Code Status Info: Full COde ?  ?  ?  ?  ?   ? ?Current Medications (11/29/2021):  This is the current hospital active medication list ?Current Facility-Administered Medications  ?Medication Dose Route Frequency Provider  Last Rate Last Admin  ? acetaminophen (TYLENOL) tablet 650 mg  650 mg Oral Q6H PRN Thornton Park, MD   650 mg at 11/29/21 0455  ? alum & mag hydroxide-simeth (MAALOX/MYLANTA) 200-200-20 MG/5ML suspension 30 mL  30 mL Oral Q4H PRN Thornton Park, MD      ? amLODipine (NORVASC) tablet 5 mg  5 mg Oral Daily Thornton Park, MD   5 mg at 11/29/21 1103  ? atorvastatin (LIPITOR) tablet 80 mg  80 mg Oral Daily Thornton Park, MD   80 mg at 11/29/21 1104  ? bisacodyl (DULCOLAX) suppository 10 mg  10 mg Rectal Daily PRN Thornton Park, MD      ? calcium carbonate (TUMS - dosed in mg elemental calcium) chewable tablet 500 mg  500 mg Oral BID Thornton Park, MD   500 mg at 11/29/21 1106  ? carvedilol (COREG) tablet 25 mg  25 mg Oral BID WC Thornton Park, MD   25 mg at 11/29/21 6314  ? Chlorhexidine Gluconate Cloth 2 % PADS 6 each  6 each Topical Daily Fritzi Mandes, MD   6 each at 11/28/21 0801  ? Chlorhexidine Gluconate Cloth 2 % PADS 6 each  6 each Topical Q0600 Liana Gerold, MD   6 each at 11/29/21 0600  ? docusate sodium (COLACE) capsule 100 mg  100 mg Oral BID Thornton Park, MD   100  mg at 14/97/02 6378  ? folic acid (FOLVITE) tablet 1 mg  1 mg Oral Daily Thornton Park, MD   1 mg at 11/29/21 1107  ? gabapentin (NEURONTIN) capsule 100 mg  100 mg Oral Daily Thornton Park, MD   100 mg at 11/29/21 1108  ? heparin injection 5,000 Units  5,000 Units Subcutaneous Q8H Thornton Park, MD   5,000 Units at 11/29/21 5885  ? HYDROmorphone (DILAUDID) injection 0.5-1 mg  0.5-1 mg Intravenous Q4H PRN Thornton Park, MD      ? insulin aspart (novoLOG) injection 0-5 Units  0-5 Units Subcutaneous QHS Thornton Park, MD      ? insulin aspart (novoLOG) injection 0-6 Units  0-6 Units Subcutaneous TID WC Thornton Park, MD      ? melatonin tablet 5 mg  5 mg Oral QHS PRN Thornton Park, MD   5 mg at 11/28/21 2106  ? menthol-cetylpyridinium (CEPACOL) lozenge 3 mg  1 lozenge Oral PRN Thornton Park, MD       ? Or  ? phenol (CHLORASEPTIC) mouth spray 1 spray  1 spray Mouth/Throat PRN Thornton Park, MD      ? methocarbamol (ROBAXIN) tablet 500 mg  500 mg Oral Q6H PRN Thornton Park, MD   500 mg at 11/28/21 0800  ? Or  ? methocarbamol (ROBAXIN) 500 mg in dextrose 5 % 50 mL IVPB  500 mg Intravenous Q6H PRN Thornton Park, MD      ? multivitamin with minerals tablet 1 tablet  1 tablet Oral QPM Thornton Park, MD   1 tablet at 11/28/21 1758  ? ondansetron (ZOFRAN) tablet 4 mg  4 mg Oral Q6H PRN Thornton Park, MD      ? Or  ? ondansetron [facility]) injection 4 mg  4 mg Intravenous Q6H PRN Thornton Park, MD      ? oxybutynin (DITROPAN) tablet 5 mg  5 mg Oral TID Thornton Park, MD   5 mg at 11/29/21 1109  ? oxyCODONE (Oxy IR/ROXICODONE) immediate release tablet 5 mg  5 mg Oral Q4H PRN Thornton Park, MD   5 mg at 11/29/21 0910  ? pantoprazole (PROTONIX) EC tablet 40 mg  40 mg Oral Q12H Thornton Park, MD   40 mg at 11/29/21 1109  ? polyethylene glycol (MIRALAX / GLYCOLAX) packet 17 g  17 g Oral Daily PRN Thornton Park, MD      ? predniSONE (DELTASONE) tablet 10 mg  10 mg Oral Q breakfast Thornton Park, MD   10 mg at 11/29/21 0908  ? prochlorperazine (COMPAZINE) injection 10 mg  10 mg Intravenous Q6H PRN Thornton Park, MD      ? senna-docusate (Senokot-S) tablet 1 tablet  1 tablet Oral QHS Thornton Park, MD   1 tablet at 11/28/21 2105  ? tamsulosin (FLOMAX) capsule 0.4 mg  0.4 mg Oral QHS Thornton Park, MD   0.4 mg at 11/28/21 2109  ? torsemide (DEMADEX) tablet 20 mg  20 mg Oral BID Thornton Park, MD   20 mg at 11/29/21 0908  ? traMADol (ULTRAM) tablet 50 mg  50 mg Oral Q6H PRN Fritzi Mandes, MD      ? traZODone (DESYREL) tablet 50 mg  50 mg Oral QHS Thornton Park, MD   50 mg at 11/28/21 2105  ? vitamin B-12 (CYANOCOBALAMIN) tablet 500 mcg  500 mcg Oral q AM Thornton Park, MD   500 mcg at 11/29/21 0277  ? Vitamin D (Ergocalciferol) (DRISDOL) capsule 50,000 Units  50,000 Units Oral Q Sat  Krasinski,  Lennette Bihari, MD      ? ? ? ?Discharge Medications: ?Please see discharge summary for a list of discharge medications. ? ?Relevant Imaging Results: ? ?Relevant Lab Results: ? ? ?Additional Information ?SS# [id number] ? ?Conception Oms, RN ? ? ? ? ?

## 2021-11-29 NOTE — Progress Notes (Signed)
Physical Therapy Treatment ?Patient Details ?Name: Micheal Aguirre ?MRN: [id number] ?DOB: 1955-10-17 ?Today's Date: 11/29/2021 ? ? ?History of Present Illness 66 y.o. male with medical history significant for ESRD on HD MWF, hypertension, type 2 diabetes, hyperlipidemia, coronary artery disease status post CABG, HFrEF 35%, duodenal ulcers, history of GI bleed, prostate cancer, with metastasis to bone, left renal mass, who presented to [facility] ED via EMS after a mechanical fall at the hemodialysis center. X-ray of knee revealed right lateral tibial plateau fracture and joint effusion.  Pt now s/p ORIF of right lateral tibial plateau fracture 11/27/2021 ? ?  ?PT Comments  ? ? Pt seen for PT tx with pt agreeable despite reporting 8/10 knee pain after being premedicated. Pt performs RLE strengthening exercises but has increased ease of performing hip abduction slides & SLR compared to RLE ankle pumps as pt with decreased strength & pain. Pt also noted to have edema in R foot & MD notified. Pt requires RLE support to come to sitting EOB & ultimately requires +2 assist for successful STS from elevated EOB with RW. Pt performs stand pivot to recliner with PT supporting RLE to assist with maintaining weight bearing precautions but pt starting to push through RLE/PT's foot. Pt is pleasant & agreeable to participate despite significant RLE pain. PT discussed potential need to use w/c for primary means of mobility until RLE heals & pt is agreeable. ?   ?Recommendations for follow up therapy are one component of a multi-disciplinary discharge planning process, led by the attending physician.  Recommendations may be updated based on patient status, additional functional criteria and insurance authorization. ? ?Follow Up Recommendations ? Skilled nursing-short term rehab (<3 hours/day) ?  ?  ?Assistance Recommended at Discharge Frequent or constant Supervision/Assistance  ?Patient can return home with the following Two people to help with  walking and/or transfers;A lot of help with bathing/dressing/bathroom;Assist for transportation;Assistance with cooking/housework;Help with stairs or ramp for entrance ?  ?Equipment Recommendations ? Wheelchair (measurements PT);Wheelchair cushion (measurements PT) (elevating leg rests)  ?  ?Recommendations for Other Services   ? ? ?  ?Precautions / Restrictions Precautions ?Precautions: Fall ?Required Braces or Orthoses: Knee Immobilizer - Right ?Knee Immobilizer - Right: On at all times;Other (comment) (can remove for gentle ROM) ?Restrictions ?Weight Bearing Restrictions: Yes ?RUE Weight Bearing: Non weight bearing ?RLE Weight Bearing: Non weight bearing  ?  ? ?Mobility ? Bed Mobility ?Overal bed mobility: Needs Assistance ?Bed Mobility: Supine to Sit ?  ?  ?Supine to sit: Mod assist, HOB elevated ?  ?  ?General bed mobility comments: PT supports RLE to move to EOB, HOB elevated, bed rails PRN, extra time ?  ? ?Transfers ?Overall transfer level: Needs assistance ?Equipment used: Rolling walker (2 wheels) ?Transfers: Sit to/from Stand, Bed to chair/wheelchair/BSC ?Sit to Stand: Max assist, +2 physical assistance, From elevated surface (Cuing for hand placement, STS from elevated EOB, PT supporting RLE to help limit weight bearing through extremity) ?Stand pivot transfers: Mod assist, +2 physical assistance (stand pivot bed>recliner with RW & +2 assist with pt scooting L foot along floor, PT supporting RLE to assist with maintaining NWB RLE) ?  ?  ?  ?  ?  ?  ? ?Ambulation/Gait ?  ?  ?  ?  ?  ?  ?  ?  ? ? ?Stairs ?  ?  ?  ?  ?  ? ? ?Wheelchair Mobility ?  ? ?Modified Rankin (Stroke Patients Only) ?  ? ? ?  ?  Balance Overall balance assessment: Needs assistance ?Sitting-balance support: Bilateral upper extremity supported, Feet supported ?Sitting balance-Leahy Scale: Fair ?Sitting balance - Comments: supervision static sitting ?  ?Standing balance support: Bilateral upper extremity supported, During functional  activity, Reliant on assistive device for balance ?Standing balance-Leahy Scale: Poor ?Standing balance comment: BUE support on RW ?  ?  ?  ?  ?  ?  ?  ?  ?  ?  ?  ?  ? ?  ?Cognition Arousal/Alertness: Awake/alert ?Behavior During Therapy: [facility] for tasks assessed/performed ?Overall Cognitive Status: Within Functional Limits for tasks assessed ?  ?  ?  ?  ?  ?  ?  ?  ?  ?  ?  ?  ?  ?  ?  ?  ?General Comments: very sweet man ?  ?  ? ?  ?Exercises General Exercises - Lower Extremity ?Ankle Circles/Pumps: 10 reps, Supine, Right, AAROM (minimal active movement noted, R foot edema) ?Hip ABduction/ADduction: AAROM, Right, 10 reps, Supine, Strengthening ?Straight Leg Raises: AAROM, Supine, Strengthening, Right, 10 reps ? ?  ?General Comments   ?  ?  ? ?Pertinent Vitals/Pain Pain Assessment ?Pain Assessment: 0-10 ?Pain Score: 8  ?Pain Location: R knee ?Pain Descriptors / Indicators: Sore, Grimacing, Discomfort ?Pain Intervention(s): Monitored during session, Premedicated before session, Repositioned  ? ? ?Home Living   ?  ?  ?  ?  ?  ?  ?  ?  ?  ?   ?  ?Prior Function    ?  ?  ?   ? ?PT Goals (current goals can now be found in the care plan section) Acute Rehab PT Goals ?Patient Stated Goal: Get my leg healed and feel better; decrease pain; and walk ?PT Goal Formulation: With patient ?Time For Goal Achievement: 12/12/21 ?Potential to Achieve Goals: Fair ?Progress towards PT goals: Progressing toward goals ? ?  ?Frequency ? ? ? BID ? ? ? ?  ?PT Plan Current plan remains appropriate  ? ? ?Co-evaluation   ?  ?  ?  ?  ? ?  ?AM-PAC PT "6 Clicks" Mobility   ?Outcome Measure ? Help needed turning from your back to your side while in a flat bed without using bedrails?: A Little ?Help needed moving from lying on your back to sitting on the side of a flat bed without using bedrails?: A Lot ?Help needed moving to and from a bed to a chair (including a wheelchair)?: Total ?Help needed standing up from a chair using your arms (e.g.,  wheelchair or bedside chair)?: Total ?Help needed to walk in hospital room?: Total ?Help needed climbing 3-5 steps with a railing? : Total ?6 Click Score: 9 ? ?  ?End of Session Equipment Utilized During Treatment: Gait belt;Right knee immobilizer ?Activity Tolerance: Patient limited by pain;Patient tolerated treatment well ?Patient left: in chair;with call bell/phone within reach;with nursing/sitter in room ?Nurse Communication: Mobility status;Precautions;Weight bearing status ?PT Visit Diagnosis: Unsteadiness on feet (R26.81);Other abnormalities of gait and mobility (R26.89);Muscle weakness (generalized) (M62.81);Difficulty in walking, not elsewhere classified (R26.2);Pain ?Pain - Right/Left: Right ?Pain - part of body: Knee ?  ? ? ?Time: 9211-9417 ?PT Time Calculation (min) (ACUTE ONLY): 24 min ? ?Charges:  $Therapeutic Activity: 23-37 mins          ?          ? ?Lavone Nian, PT, DPT ?11/29/21, 10:18 AM ? ? ? ?Micheal Aguirre ?11/29/2021, 10:14 AM ? ?

## 2021-11-29 NOTE — TOC Progression Note (Signed)
Transition of Care (TOC) - Progression Note  ? ? ?Patient Details  ?Name: Micheal Aguirre ?MRN: [id number] ?Date of Birth: 05/11/1956 ? ?Transition of Care (TOC) CM/SW Contact  ?Conception Oms, RN ?Phone Number: ?11/29/2021, 11:28 AM ? ?Clinical Narrative:   Met with the patient to discuss DC plan and needs ?He is agreeable to go to [facility] SNF, He has [facility] Medicare ?Bedsearch sent, FL2 complete, PASSR [place] ? ? ? ?Expected Discharge Plan: [place] ?Barriers to Discharge: SNF Pending bed offer ? ?Expected Discharge Plan and Services ?Expected Discharge Plan: [place] ?  ?  ?  ?  ?                ?  ?  ?  ?  ?  ?  ?  ?  ?  ?  ? ? ?Social Determinants of Health (SDOH) Interventions ?  ? ?Readmission Risk Interventions ?   ? View : No data to display.  ?  ?  ?  ? ? ?

## 2021-11-29 NOTE — Progress Notes (Signed)
PHARMACY CONSULT NOTE ? ?Pharmacy Consult for Electrolyte Monitoring and Replacement  ? ?Recent Labs: ?Potassium (mmol/L)  ?Date Value  ?11/29/2021 3.8  ? ?Magnesium (mg/dL)  ?Date Value  ?11/29/2021 2.1  ? ?Calcium (mg/dL)  ?Date Value  ?11/29/2021 7.7 (L)  ? ?Albumin (g/dL)  ?Date Value  ?11/27/2021 3.4 (L)  ? ?Phosphorus (mg/dL)  ?Date Value  ?11/28/2021 4.5  ? ?Sodium (mmol/L)  ?Date Value  ?11/29/2021 138  ? ?Corrected Ca: 8.2 mg/dL ? ?Assessment: 66 y.o. male with PMH of ESRD on HD MWF, essential hypertension, type 2 diabetes, hyperlipidemia, CAD s/p CABG, HFrEF 35%, duodenal ulcers, history of GI bleed, prostate cancer, with metastasis to bone, left renal mass, who presented to [facility] ED via EMS after a mechanical fall at the hemodialysis center. Pharmacy is asked to follow and replace electrolytes  ? ?Diuretics: torsemide 20 mg po BID ? ?Goal of Therapy:  ?Potassium 4.0 - 5.1 mmol/L ?Magnesium 2.0 - 2.4 mg/dL ?All Other Electrolytes WNL ? ?Plan:  ?Give 20 mEq KCl PO x1  ?Recheck electrolytes with AM labs ? ? ?Thank you for allowing pharmacy to be a part of this patient?s care. ? ?Pernell Dupre, PharmD, BCPS ?Clinical Pharmacist ?11/29/2021 ?8:14 AM ? ? ?

## 2021-11-30 DIAGNOSIS — N2889 Other specified disorders of kidney and ureter: Secondary | ICD-10-CM

## 2021-11-30 DIAGNOSIS — C61 Malignant neoplasm of prostate: Secondary | ICD-10-CM

## 2021-11-30 DIAGNOSIS — E119 Type 2 diabetes mellitus without complications: Secondary | ICD-10-CM

## 2021-11-30 DIAGNOSIS — I5022 Chronic systolic (congestive) heart failure: Secondary | ICD-10-CM

## 2021-11-30 DIAGNOSIS — I5032 Chronic diastolic (congestive) heart failure: Secondary | ICD-10-CM

## 2021-11-30 LAB — BASIC METABOLIC PANEL
Anion gap: 12 (ref 5–15)
BUN: 47 mg/dL — ABNORMAL HIGH (ref 8–23)
CO2: 26 mmol/L (ref 22–32)
Calcium: 8.1 mg/dL — ABNORMAL LOW (ref 8.9–10.3)
Chloride: 98 mmol/L (ref 98–111)
Creatinine, Ser: 5.48 mg/dL — ABNORMAL HIGH (ref 0.61–1.24)
GFR, Estimated: 11 mL/min — ABNORMAL LOW (ref >60)
Glucose, Bld: 114 mg/dL — ABNORMAL HIGH (ref 70–99)
Potassium: 4.1 mmol/L (ref 3.5–5.1)
Sodium: 136 mmol/L (ref 135–145)

## 2021-11-30 LAB — CBC
HCT: 31.1 % — ABNORMAL LOW (ref 39.0–52.0)
Hemoglobin: 9.6 g/dL — ABNORMAL LOW (ref 13.0–17.0)
MCH: 29 pg (ref 26.0–34.0)
MCHC: 30.9 g/dL (ref 30.0–36.0)
MCV: 94 fL (ref 80.0–100.0)
Platelets: 99 10*3/uL — ABNORMAL LOW (ref 150–400)
RBC: 3.31 MIL/uL — ABNORMAL LOW (ref 4.22–5.81)
RDW: 15.1 % (ref 11.5–15.5)
WBC: 5 10*3/uL (ref 4.0–10.5)
nRBC: 0 % (ref 0.0–0.2)

## 2021-11-30 LAB — GLUCOSE, CAPILLARY
Glucose-Capillary: 98 mg/dL (ref 70–99)
Glucose-Capillary: 123 mg/dL — ABNORMAL HIGH (ref 70–99)
Glucose-Capillary: 128 mg/dL — ABNORMAL HIGH (ref 70–99)
Glucose-Capillary: 130 mg/dL — ABNORMAL HIGH (ref 70–99)

## 2021-11-30 LAB — HEPATITIS B SURFACE ANTIBODY, QUANTITATIVE: Hep B S AB Quant (Post): <3.1 m[IU]/mL — ABNORMAL LOW (ref >9.9)

## 2021-11-30 LAB — MAGNESIUM: Magnesium: 2.3 mg/dL (ref 1.7–2.4)

## 2021-11-30 MED ORDER — HEPARIN SODIUM (PORCINE) 1000 UNIT/ML IJ SOLN
INTRAMUSCULAR | Status: AC
  Filled 2021-11-30: qty 10

## 2021-11-30 NOTE — Progress Notes (Signed)
Occupational Therapy Treatment ?Patient Details ?Name: Micheal Aguirre ?MRN: [id number] ?DOB: [birth year] ?Today's Date: 11/30/2021 ? ? ?History of present illness 66 y.o. male with medical history significant for ESRD on HD MWF, hypertension, type 2 diabetes, hyperlipidemia, coronary artery disease status post CABG, HFrEF 35%, duodenal ulcers, history of GI bleed, prostate cancer, with metastasis to bone, left renal mass, who presented to [facility] ED via EMS after a mechanical fall at the hemodialysis center. X-ray of knee revealed right lateral tibial plateau fracture and joint effusion.  Pt now s/p ORIF of right lateral tibial plateau fracture 11/27/2021 ?  ?OT comments ? Pt seen for OT treatment on this date. Upon arrival to room, pt awake and seated upright in bed. Pt reporting 8/10 knee pain despite being premedicate 1 hour prior to this author's arrival. Pt agreeable to OT tx and verbalizing goal to bathe. Pt required SUPERVISION/SET-UP for seated UB/LB bathing. Pt attempted sit>stand LB bathing, requiring MAX A for transfer, however was unable to maintain static standing balance for >5sec. Pt required SUPERVISION for seated UB dressing and MOD A for LB dressing. Pt was left sitting EOB with PT present. Pt is making good progress toward goals and continues to benefit from skilled OT services to maximize return to PLOF and minimize risk of future falls, injury, caregiver burden, and readmission. Will continue to follow POC. Discharge recommendation remains appropriate.    ? ?Recommendations for follow up therapy are one component of a multi-disciplinary discharge planning process, led by the attending physician.  Recommendations may be updated based on patient status, additional functional criteria and insurance authorization. ?   ?Follow Up Recommendations ? Skilled nursing-short term rehab (<3 hours/day)  ?  ?Assistance Recommended at Discharge Intermittent Supervision/Assistance  ?Patient can return home with the  following ? Two people to help with walking and/or transfers;A lot of help with bathing/dressing/bathroom ?  ?Equipment Recommendations ? Other (comment) (defer to next venue of care)  ?  ?   ?Precautions / Restrictions Precautions ?Precautions: Fall ?Required Braces or Orthoses: Knee Immobilizer - Right ?Knee Immobilizer - Right: On at all times;Other (comment) (can remove for gentle ROM) ?Restrictions ?Weight Bearing Restrictions: Yes ?RLE Weight Bearing: Non weight bearing  ? ? ?  ? ?Mobility Bed Mobility ?Overal bed mobility: Needs Assistance ?Bed Mobility: Supine to Sit ?  ?  ?Supine to sit: Min guard, HOB elevated ?  ?  ?General bed mobility comments: With use of b/l UE to lift and move RLE, pt able to perform bed mobility without assist ?  ? ?Transfers ?Overall transfer level: Needs assistance ?Equipment used: Rolling walker (2 wheels) ?Transfers: Sit to/from Stand ?Sit to Stand: Max assist ?  ?  ?  ?  ?  ?General transfer comment: Pt only able to maintain static standing balance for ~5sec before reporting throbbing RLE and initiating return to sitting EOB ?  ?  ?Balance Overall balance assessment: Needs assistance ?Sitting-balance support: No upper extremity supported, Feet supported ?Sitting balance-Leahy Scale: Fair ?Sitting balance - Comments: supervision static sitting ?  ?Standing balance support: Bilateral upper extremity supported, During functional activity, Reliant on assistive device for balance ?Standing balance-Leahy Scale: Zero ?Standing balance comment: Requires MOD-MAX A to maintain static standing balance for ~5sec ?  ?  ?  ?  ?  ?  ?  ?  ?  ?  ?  ?   ? ?ADL either performed or assessed with clinical judgement  ? ?ADL Overall ADL's : Needs assistance/impaired ?  ?  ?  Grooming: Wash/dry hands;Wash/dry face;Supervision/safety;Set up;Sitting ?Grooming Details (indicate cue type and reason): SUPERVISION for completing while sitting EOB ?Upper Body Bathing: Supervision/ safety;Set up;Sitting ?Upper  Body Bathing Details (indicate cue type and reason): SUPERVISION for completing while sitting EOB ?Lower Body Bathing: Supervison/ safety;Set up;Sitting/lateral leans ?Lower Body Bathing Details (indicate cue type and reason): Pt attempted sit>stand LB bathing, however unable to maintain static standing balance for >5sec ?Upper Body Dressing : Supervision/safety;Set up;Sitting ?Upper Body Dressing Details (indicate cue type and reason): to don/doff hospital gown ?Lower Body Dressing: Moderate assistance;Sitting/lateral leans ?Lower Body Dressing Details (indicate cue type and reason): Pt able to don L sock, but requires assist to don R sock ?  ?  ?  ?  ?  ?  ?  ?  ?  ? ? ? ?Cognition Arousal/Alertness: Awake/alert ?Behavior During Therapy: [facility] for tasks assessed/performed ?Overall Cognitive Status: Within Functional Limits for tasks assessed ?  ?  ?  ?  ?  ?  ?  ?  ?  ?  ?  ?  ?  ?  ?  ?  ?  ?  ?  ?   ?   ?   ?   ? ? ?Pertinent Vitals/ Pain       Pain Assessment ?Pain Assessment: 0-10 ?Pain Score: 8  ?Pain Location: R knee ?Pain Descriptors / Indicators: Grimacing, Discomfort, Throbbing ?Pain Intervention(s): Monitored during session, Repositioned, Premedicated before session ? ?   ?   ? ?Frequency ? Min 2X/week  ? ? ? ? ?  ?Progress Toward Goals ? ?OT Goals(current goals can now be found in the care plan section) ? Progress towards OT goals: Progressing toward goals ? ?Acute Rehab OT Goals ?Patient Stated Goal: to have less pain ?OT Goal Formulation: With patient ?Time For Goal Achievement: 12/12/21 ?Potential to Achieve Goals: Good  ?Plan Discharge plan remains appropriate;Frequency remains appropriate   ? ?   ?AM-PAC OT "6 Clicks" Daily Activity     ?Outcome Measure ? ? Help from another person eating meals?: None ?Help from another person taking care of personal grooming?: A Little ?Help from another person toileting, which includes using toliet, bedpan, or urinal?: A Lot ?Help from another person bathing  (including washing, rinsing, drying)?: A Little ?Help from another person to put on and taking off regular upper body clothing?: A Little ?Help from another person to put on and taking off regular lower body clothing?: A Lot ?6 Click Score: 17 ? ?  ?End of Session Equipment Utilized During Treatment: Rolling walker (2 wheels);Right knee immobilizer ? ?OT Visit Diagnosis: Unsteadiness on feet (R26.81);History of falling (Z91.81);Pain ?Pain - Right/Left: Right ?Pain - part of body: Knee ?  ?Activity Tolerance Patient tolerated treatment well ?  ?Patient Left in bed;with call bell/phone within reach;Other (comment) (with PT present) ?  ?Nurse Communication Mobility status ?  ? ?   ? ?Time: 0867-6195 ?OT Time Calculation (min): 23 min ? ?Charges: OT General Charges ?$OT Visit: 1 Visit ?OT Treatments ?$Self Care/Home Management : 23-37 mins ? ?Fredirick Maudlin, OTR/L ?[place] ? ?

## 2021-11-30 NOTE — Assessment & Plan Note (Addendum)
-   A1c 5.6% on 11/27/2021 ?- Okay to continue diet control ?

## 2021-11-30 NOTE — Assessment & Plan Note (Signed)
-   Continue amlodipine, Coreg, torsemide ?

## 2021-11-30 NOTE — Care Management Important Message (Signed)
Important Message ? ?Patient Details  ?Name: Micheal Aguirre ?MRN: [id number] ?Date of Birth: 09-15-1955 ? ? ?Medicare Important Message Given:  N/A - LOS <3 / Initial given by admissions ? ? ? ? ?Juliann Pulse A Mitzy Naron ?11/30/2021, 1:48 PM ?

## 2021-11-30 NOTE — Assessment & Plan Note (Signed)
-   Nephrology following, continue dialysis per plan ?

## 2021-11-30 NOTE — Assessment & Plan Note (Addendum)
-   follows with [facility] ?- continue outpatient followup ?-Last office visit note reviewed from 11/17/2021.  He is on indefinite androgen deprivation therapy with Eligard; next dose due around 12/15/2021 ?

## 2021-11-30 NOTE — Progress Notes (Signed)
?Progress Note ? ? ? ?Micheal Aguirre   ?YIF:[id number]  ?DOB: 12-02-1955  ?DOA: 11/26/2021     4 ?PCP: Pcp, No ? ?Initial CC: fall after HD ? ?Hospital Course: ?Micheal Aguirre is a 66 y.o. male with medical history significant for ESRD on HD MWF, essential hypertension, type 2 diabetes, hyperlipidemia, coronary artery disease status post CABG, HFrEF 35%, duodenal ulcers, history of GI bleed, prostate cancer, with metastasis to bone, left renal mass, who presented to [facility] ED via EMS after a mechanical fall at the hemodialysis center.  Immediately after his fall he incurred right knee pain with significant edema to his right knee. ?  ?Right knee x-ray revealed right lateral tibial plateau fracture and joint effusion.  CT scan confirmed the same.  He was evaluated by orthopedic surgery and underwent ORIF of the right lateral tibial plateau fracture on 11/27/2021.  He had recommendations to remain nonweightbearing on his right lower extremity. ? ?Interval History:  ?Resting in bed when seen this morning.  Denies any significant concerns or complaints today.  Understands tentative plan is for discharging to rehab once facility found. ? ?Assessment and Plan: ?* Tibial plateau fracture ?- S/p mechanical fall after dialysis ?- Underwent ORIF of right lateral tibial plateau fracture on 11/27/2021 ?-Remains nonweightbearing to right lower extremity per orthopedic recommendations ?- Likely will need SNF at discharge, being worked on in setting of still needing chronic HD ?-Continue pain control ? ?Left renal mass ?- Followed closely by oncology outpatient as well ? ?Prostate cancer [facility]) ?- follows with [facility] ?- continue outpatient followup ?-Last office visit note reviewed from 11/17/2021.  He is on indefinite androgen deprivation therapy with Eligard; next dose due around 12/15/2021 ? ?DMII (diabetes mellitus, type 2) ([place]) ?- Continue SSI and CBG monitoring ? ?Chronic systolic CHF (congestive heart failure) (HCC) ?- EF 35% ?- no s/s  exacerbation ?- continue HD for volume control  ? ?Primary hypertension ?- Continue amlodipine, Coreg, torsemide ? ?ESRD on dialysis [facility]) ?- Nephrology following, continue dialysis per plan ? ? ? ?Old records reviewed in assessment of this patient ? ?Antimicrobials: ? ? ?DVT prophylaxis:  ?SCDs Start: 11/27/21 1814 ?Place TED hose Start: 11/27/21 1814 ? ? ?Code Status:   Code Status: Full Code ? ?Disposition Plan: SNF ?Status is: Inpatient ? ?Objective: ?Blood pressure 132/85, pulse 82, temperature 99.7 ?F (37.6 ?C), resp. rate 16, height '5\' 9"'$  (1.753 m), weight 84.8 kg, SpO2 99 %.  ?Examination:  ?Physical Exam ?Constitutional:   ?   General: He is not in acute distress. ?   Appearance: Normal appearance.  ?HENT:  ?   Head: Normocephalic and atraumatic.  ?   Mouth/Throat:  ?   Mouth: Mucous membranes are moist.  ?Eyes:  ?   Extraocular Movements: Extraocular movements intact.  ?Cardiovascular:  ?   Rate and Rhythm: Normal rate and regular rhythm.  ?   Heart sounds: Normal heart sounds.  ?Pulmonary:  ?   Effort: Pulmonary effort is normal. No respiratory distress.  ?   Breath sounds: Normal breath sounds. No wheezing.  ?Abdominal:  ?   General: Bowel sounds are normal. There is no distension.  ?   Palpations: Abdomen is soft.  ?   Tenderness: There is no abdominal tenderness.  ?Musculoskeletal:  ?   Cervical back: Normal range of motion and neck supple.  ?   Comments: Right knee immobilizer in place.  1-2+ pitting edema appreciated in right foot  ?Skin: ?   General: Skin is  warm and dry.  ?Neurological:  ?   General: No focal deficit present.  ?   Mental Status: He is alert.  ?Psychiatric:     ?   Mood and Affect: Mood normal.     ?   Behavior: Behavior normal.  ?  ? ?Consultants:  ?Orthopedic surgery ? ?Procedures:  ?ORIF right lateral tibial plateau fracture, 11/27/2021 ? ?Data Reviewed: ?Results for orders placed or performed during the hospital encounter of 11/26/21 (from the past 24 hour(s))  ?Glucose, capillary      Status: Abnormal  ? Collection Time: 11/29/21 10:02 PM  ?Result Value Ref Range  ? Glucose-Capillary 142 (H) 70 - 99 mg/dL  ?CBC     Status: Abnormal  ? Collection Time: 11/30/21  5:53 AM  ?Result Value Ref Range  ? WBC 5.0 4.0 - 10.5 K/uL  ? RBC 3.31 (L) 4.22 - 5.81 MIL/uL  ? Hemoglobin 9.6 (L) 13.0 - 17.0 g/dL  ? HCT 31.1 (L) 39.0 - 52.0 %  ? MCV 94.0 80.0 - 100.0 fL  ? MCH 29.0 26.0 - 34.0 pg  ? MCHC 30.9 30.0 - 36.0 g/dL  ? RDW 15.1 11.5 - 15.5 %  ? Platelets 99 (L) 150 - 400 K/uL  ? nRBC 0.0 0.0 - 0.2 %  ?Basic metabolic panel     Status: Abnormal  ? Collection Time: 11/30/21  5:53 AM  ?Result Value Ref Range  ? Sodium 136 135 - 145 mmol/L  ? Potassium 4.1 3.5 - 5.1 mmol/L  ? Chloride 98 98 - 111 mmol/L  ? CO2 26 22 - 32 mmol/L  ? Glucose, Bld 114 (H) 70 - 99 mg/dL  ? BUN 47 (H) 8 - 23 mg/dL  ? Creatinine, Ser 5.48 (H) 0.61 - 1.24 mg/dL  ? Calcium 8.1 (L) 8.9 - 10.3 mg/dL  ? GFR, Estimated 11 (L) >60 mL/min  ? Anion gap 12 5 - 15  ?Magnesium     Status: None  ? Collection Time: 11/30/21  5:53 AM  ?Result Value Ref Range  ? Magnesium 2.3 1.7 - 2.4 mg/dL  ?Glucose, capillary     Status: None  ? Collection Time: 11/30/21  8:09 AM  ?Result Value Ref Range  ? Glucose-Capillary 98 70 - 99 mg/dL  ? Comment 1 Notify RN   ? Comment 2 Document in Chart   ?Glucose, capillary     Status: Abnormal  ? Collection Time: 11/30/21 12:28 PM  ?Result Value Ref Range  ? Glucose-Capillary 123 (H) 70 - 99 mg/dL  ?  ?I have Reviewed nursing notes, Vitals, and Lab results since pt's last encounter. Pertinent lab results : see above ?I have ordered test including BMP, CBC, Mg ?I have reviewed the last note from staff over past 24 hours ?I have discussed pt's care plan and test results with nursing staff, case manager ? ? LOS: 4 days  ? ?Dwyane Dee, MD ?[facility] ?11/30/2021, 5:02 PM ? ?

## 2021-11-30 NOTE — Assessment & Plan Note (Signed)
-   Followed closely by oncology outpatient as well ?

## 2021-11-30 NOTE — Progress Notes (Signed)
Physical Therapy Treatment ?Patient Details ?Name: Micheal Aguirre ?MRN: [id number] ?DOB: 12/29/1955 ?Today's Date: 11/30/2021 ? ? ?History of Present Illness 66 y.o. male with medical history significant for ESRD on HD MWF, hypertension, type 2 diabetes, hyperlipidemia, coronary artery disease status post CABG, HFrEF 35%, duodenal ulcers, history of GI bleed, prostate cancer, with metastasis to bone, left renal mass, who presented to [facility] ED via EMS after a mechanical fall at the hemodialysis center. X-ray of knee revealed right lateral tibial plateau fracture and joint effusion.  Pt now s/p ORIF of right lateral tibial plateau fracture 11/27/2021 ? ?  ?PT Comments  ? ? Pt showed good effort and engagement but needed repeated cues t/o the session and still failed to show great awareness of limitations, expectations, etc.  He was able to do most bed mobility w/o assist, though did need at least 1 UE to hold KI to assist with R LE movement.  He managed to do 3 separate standing attempts but each was relatively short-lived for all the work up and cuing leading up to standing.   He did not have a lot of tolerance for prolonged standing and though he initially seemed to keep weight off R LE relatively well this seemed to diminish with time as did his over all tolerance.  He attempted a few heel-toe shuffles on L while trying to maintain R NWBing but this was ultimately too difficult for him to safely do.  New orders in for AFO, called and discussed with ortho MD, primarily for positioning purposes and not necessarily for a mobility aid.  Pt pleasant but needing a lot of reinforcement and quite limited with functional mobility.   ?Recommendations for follow up therapy are one component of a multi-disciplinary discharge planning process, led by the attending physician.  Recommendations may be updated based on patient status, additional functional criteria and insurance authorization. ? ?Follow Up Recommendations ? Skilled  nursing-short term rehab (<3 hours/day) ?  ?  ?Assistance Recommended at Discharge Frequent or constant Supervision/Assistance  ?Patient can return home with the following Two people to help with walking and/or transfers;A lot of help with bathing/dressing/bathroom;Assist for transportation;Assistance with cooking/housework;Help with stairs or ramp for entrance ?  ?Equipment Recommendations ?    ?  ?Recommendations for Other Services   ? ? ?  ?Precautions / Restrictions Precautions ?Precautions: Fall ?Required Braces or Orthoses: Knee Immobilizer - Right (AFO for NWBing positioning) ?Knee Immobilizer - Right: On at all times;Other (comment) (off for gentle ROM) ?Restrictions ?Weight Bearing Restrictions: Yes ?RLE Weight Bearing: Non weight bearing  ?  ? ?Mobility ? Bed Mobility ?Overal bed mobility: Needs Assistance ?Bed Mobility: Supine to Sit ?  ?  ?Supine to sit: Min guard, HOB elevated ?  ?  ?General bed mobility comments: With use of b/l UE to lift and move RLE, pt able to perform bed mobility without assist ?  ? ?Transfers ?Overall transfer level: Needs assistance ?Equipment used: Rolling walker (2 wheels) ?Transfers: Sit to/from Stand ?Sit to Stand: Mod assist ?  ?  ?  ?  ?  ?General transfer comment: Pt struggled with rising from standing from standard height bed. He was able to get weight over the L LE and initiated rise to standing up ultimatley needed considerable assist and tolerated only brief bouts of standing (5-20 seconds) for each of the 3 times standing ?  ? ?Ambulation/Gait ?  ?  ?  ?  ?  ?  ?  ?General Gait Details: attempted somce  EOB heel-toe side shuffling efforts with little success.  Pt report throbbing in R knee and though he appeared to keep some weight off it it did appear that he increasingly put more weight toward back of foot with increased stance time ? ? ?Stairs ?  ?  ?  ?  ?  ? ? ?Wheelchair Mobility ?  ? ?Modified Rankin (Stroke Patients Only) ?  ? ? ?  ?Balance   ?  ?  ?  ?  ?  ?   ?  ?  ?  ?  ?  ?  ?  ?  ?  ?  ?  ?  ?  ? ?  ?Cognition Arousal/Alertness: Awake/alert ?Behavior During Therapy: [facility] for tasks assessed/performed ?Overall Cognitive Status: Within Functional Limits for tasks assessed ?  ?  ?  ?  ?  ?  ?  ?  ?  ?  ?  ?  ?  ?  ?  ?  ?  ?  ?  ? ?  ?Exercises General Exercises - Lower Extremity ?Ankle Circles/Pumps: AAROM, 20 reps (educated on t-band use and DF importance) ?Quad Sets: Strengthening, 15 reps ?Hip ABduction/ADduction: AAROM, 10 reps ?Straight Leg Raises: AAROM, 5 reps, PROM ? ?  ?General Comments   ?  ?  ? ?Pertinent Vitals/Pain Pain Assessment ?Pain Assessment: 0-10 ?Pain Score: 8  ?Pain Location: R knee  ? ? ?Home Living   ?  ?  ?  ?  ?  ?  ?  ?  ?  ?   ?  ?Prior Function    ?  ?  ?   ? ?PT Goals (current goals can now be found in the care plan section) Progress towards PT goals: Progressing toward goals ? ?  ?Frequency ? ? ? BID ? ? ? ?  ?PT Plan Current plan remains appropriate  ? ? ?Co-evaluation   ?  ?  ?  ?  ? ?  ?AM-PAC PT "6 Clicks" Mobility   ?Outcome Measure ? Help needed turning from your back to your side while in a flat bed without using bedrails?: A Little ?Help needed moving from lying on your back to sitting on the side of a flat bed without using bedrails?: A Lot ?Help needed moving to and from a bed to a chair (including a wheelchair)?: Total ?Help needed standing up from a chair using your arms (e.g., wheelchair or bedside chair)?: Total ?Help needed to walk in hospital room?: Total ?Help needed climbing 3-5 steps with a railing? : Total ?6 Click Score: 9 ? ?  ?End of Session Equipment Utilized During Treatment: Right knee immobilizer ?Activity Tolerance: Patient limited by pain;Patient tolerated treatment well ?Patient left: with bed alarm set;with call bell/phone within reach ?Nurse Communication: Mobility status ?PT Visit Diagnosis: Unsteadiness on feet (R26.81);Other abnormalities of gait and mobility (R26.89);Muscle weakness (generalized)  (M62.81);Difficulty in walking, not elsewhere classified (R26.2);Pain ?Pain - Right/Left: Right ?Pain - part of body: Knee ?  ? ? ?Time: 5974-1638 ?PT Time Calculation (min) (ACUTE ONLY): 41 min ? ?Charges:  $Therapeutic Exercise: 23-37 mins ?$Therapeutic Activity: 8-22 mins          ?          ? ?Kreg Shropshire, DPT ?11/30/2021, 4:54 PM ? ?

## 2021-11-30 NOTE — Hospital Course (Signed)
Micheal Aguirre is a 66 y.o. male with medical history significant for ESRD on HD MWF, essential hypertension, type 2 diabetes, hyperlipidemia, coronary artery disease status post CABG, HFrEF 35%, duodenal ulcers, history of GI bleed, prostate cancer, with metastasis to bone, left renal mass, who presented to [facility] ED via EMS after a mechanical fall at the hemodialysis center.  Immediately after his fall he incurred right knee pain with significant edema to his right knee. ?  ?Right knee x-ray revealed right lateral tibial plateau fracture and joint effusion.  CT scan confirmed the same.  He was evaluated by orthopedic surgery and underwent ORIF of the right lateral tibial plateau fracture on 11/27/2021.  He had recommendations to remain nonweightbearing on his right lower extremity. ?

## 2021-11-30 NOTE — Progress Notes (Signed)
Micheal Aguirre ? ?MRN: [id number] ? ?DOB/AGE: 29-Nov-1955 66 y.o. ? ?Primary Care Physician:Pcp, No ? ?Admit date: 11/26/2021 ? ?Chief Complaint:  ?Chief Complaint  ?Patient presents with  ? Fall  ? ? ?S-Pt presented on  11/26/2021 with  ?Chief Complaint  ?Patient presents with  ? Fall  ?. ? ?Update: ?Patient seen and evaluated during dialysis ?  ?HEMODIALYSIS FLOWSHEET: ? ?Blood Flow Rate (mL/min): 400 mL/min ?Arterial Pressure (mmHg): -200 mmHg ?Venous Pressure (mmHg): 150 mmHg ?Transmembrane Pressure (mmHg): 50 mmHg ?Ultrafiltration Rate (mL/min): 800 mL/min ?Dialysate Flow Rate (mL/min): 600 ml/min ?Conductivity: Machine : 13.7 ?Conductivity: Machine : 13.7 ?Dialysis Fluid Bolus: Normal Saline ?Bolus Amount (mL): 100 mL ? ?No complaints at this time ? ? ?Medications ? ? ? amLODipine  5 mg Oral Daily  ? aspirin EC  81 mg Oral BID  ? atorvastatin  80 mg Oral Daily  ? calcium carbonate  500 mg Oral BID  ? carvedilol  25 mg Oral BID WC  ? Chlorhexidine Gluconate Cloth  6 each Topical Daily  ? Chlorhexidine Gluconate Cloth  6 each Topical Q0600  ? docusate sodium  100 mg Oral BID  ? folic acid  1 mg Oral Daily  ? gabapentin  100 mg Oral Daily  ? insulin aspart  0-5 Units Subcutaneous QHS  ? insulin aspart  0-6 Units Subcutaneous TID WC  ? multivitamin with minerals  1 tablet Oral QPM  ? oxybutynin  5 mg Oral TID  ? pantoprazole  40 mg Oral Q12H  ? predniSONE  10 mg Oral Q breakfast  ? senna-docusate  1 tablet Oral QHS  ? tamsulosin  0.4 mg Oral QHS  ? torsemide  20 mg Oral BID  ? traZODone  50 mg Oral QHS  ? vitamin B-12  500 mcg Oral q AM  ? Vitamin D (Ergocalciferol)  50,000 Units Oral Q Sat  ? ? ?QHU:TMLYY from the symptoms mentioned above,there are no other symptoms referable to all systems reviewed. ? ?Physical Exam: ?Vital signs in last 24 hours: ?Temp:  [98.3 ?F (36.8 ?C)-99.8 ?F (37.7 ?C)] 98.3 ?F (36.8 ?C) (04/11 1231) ?Pulse Rate:  [61-102] 61 (04/11 1231) ?Resp:  [10-49] 14 (04/11 1231) ?BP: (109-150)/(68-111) 125/82  (04/11 1231) ?SpO2:  [95 %-100 %] 100 % (04/11 1231) ?Weight:  [84.8 kg] 84.8 kg (04/11 0920) ?Weight change:  ?Last BM Date : 11/27/21 ? ?Intake/Output from previous day: ?04/10 0701 - 04/11 0700 ?In: 78 [P.O.:610] ?Out: 100 [Urine:100] ?Total I/O ?In: -  ?Out: 1500 [Other:1500] ? ? ?Physical Exam: ? ?General- pt is awake,alert, oriented to time place and person ? ?Resp- No acute REsp distress, CTA B/L NO Rhonchi ? ?CVS- S1S2 regular in rate and rhythm ? ?GIT- BS+, soft, Non tender , Non distended ? ?EXT- No Left LE Edema,  No Cyanosis ?Right lower extremity-trace edema, immobilizer ? ? ?Access-Rt tunneled cath  ? ?Lab Results: ? ?CBC ? ?Recent Labs  ?  11/29/21 ?5035 11/30/21 ?4656  ?WBC 6.9 5.0  ?HGB 9.3* 9.6*  ?HCT 30.0* 31.1*  ?PLT 87* 99*  ? ? ? ?BMET ? ?Recent Labs  ?  11/29/21 ?8127 11/30/21 ?5170  ?NA 138 136  ?K 3.8 4.1  ?CL 102 98  ?CO2 27 26  ?GLUCOSE 116* 114*  ?BUN 31* 47*  ?CREATININE 4.10* 5.48*  ?CALCIUM 7.7* 8.1*  ? ? ? ? ? ?Most recent Creatinine trend  ?Lab Results  ?Component Value Date  ? CREATININE 5.48 (H) 11/30/2021  ? CREATININE 4.10 (H)  11/29/2021  ? CREATININE 4.94 (H) 11/28/2021  ? ?  ? ? ?MICRO ? ? ?Recent Results (from the past 240 hour(s))  ?Resp Panel by RT-PCR (Flu A&B, Covid) Nasopharyngeal Swab     Status: None  ? Collection Time: 11/26/21  6:07 PM  ? Specimen: Nasopharyngeal Swab; Nasopharyngeal(NP) swabs in vial transport medium  ?Result Value Ref Range Status  ? SARS Coronavirus 2 by RT PCR NEGATIVE NEGATIVE Final  ?  Comment: (NOTE) ?SARS-CoV-2 target nucleic acids are NOT DETECTED. ? ?The SARS-CoV-2 RNA is generally detectable in upper respiratory ?specimens during the acute phase of infection. The lowest ?concentration of SARS-CoV-2 viral copies this assay can detect is ?138 copies/mL. A negative result does not preclude SARS-Cov-2 ?infection and should not be used as the sole basis for treatment or ?other patient management decisions. A negative result may occur with   ?improper specimen collection/handling, submission of specimen other ?than nasopharyngeal swab, presence of viral mutation(s) within the ?areas targeted by this assay, and inadequate number of viral ?copies(<138 copies/mL). A negative result must be combined with ?clinical observations, patient history, and epidemiological ?information. The expected result is Negative. ? ?Fact Sheet for Patients:  ?EntrepreneurPulse.com.au ? ?Fact Sheet for Healthcare Providers:  ?IncredibleEmployment.be ? ?This test is no t yet approved or cleared by the [place] FDA and  ?has been authorized for detection and/or diagnosis of SARS-CoV-2 by ?FDA under an Emergency Use Authorization (EUA). This EUA will remain  ?in effect (meaning this test can be used) for the duration of the ?COVID-19 declaration under Section 564(b)(1) of the Act, 21 ?U.S.C.section 360bbb-3(b)(1), unless the authorization is terminated  ?or revoked sooner.  ? ? ?  ? Influenza A by PCR NEGATIVE NEGATIVE Final  ? Influenza B by PCR NEGATIVE NEGATIVE Final  ?  Comment: (NOTE) ?The Xpert Xpress SARS-CoV-2/FLU/RSV plus assay is intended as an aid ?in the diagnosis of influenza from Nasopharyngeal swab specimens and ?should not be used as a sole basis for treatment. Nasal washings and ?aspirates are unacceptable for Xpert Xpress SARS-CoV-2/FLU/RSV ?testing. ? ?Fact Sheet for Patients: ?EntrepreneurPulse.com.au ? ?Fact Sheet for Healthcare Providers: ?IncredibleEmployment.be ? ?This test is not yet approved or cleared by the [place] FDA and ?has been authorized for detection and/or diagnosis of SARS-CoV-2 by ?FDA under an Emergency Use Authorization (EUA). This EUA will remain ?in effect (meaning this test can be used) for the duration of the ?COVID-19 declaration under Section 564(b)(1) of the Act, 21 U.S.C. ?section 360bbb-3(b)(1), unless the authorization is terminated  or ?revoked. ? ?Performed at [facility], [place], ?[place] [address] ?  ?Surgical PCR screen     Status: None  ? Collection Time: 11/27/21  6:49 AM  ? Specimen: Nasal Mucosa; Nasal Swab  ?Result Value Ref Range Status  ? MRSA, PCR NEGATIVE NEGATIVE Final  ? Staphylococcus aureus NEGATIVE NEGATIVE Final  ?  Comment: (NOTE) ?The Xpert SA Assay (FDA approved for NASAL specimens in patients 55 ?years of age and older), is one component of a comprehensive ?surveillance program. It is not intended to diagnose infection nor to ?guide or monitor treatment. ?Performed at [facility], [place], ?[place] [address] ?  ?  ? ? ? ? ? ?Impression: ? ?[facility]/MWF/ Rt Permcath ? ?1)End stage renal disease on hemodialysis.  ? Receiving dialysis with UF goal 1-1.5L as tolerated. Next treatment scheduled for tomorrow to maintain outpatient schedule.  ? Dialysis coordinator monitoring discharge plan which may  include changing outpatient treatment clinic based on rehab location.  ? ? ?2)Hypertension with chronic kidney disease. Home regimen includes amlodipine, carvedilol, and torsemide. Currently receiving these medications inpatient.  ? BP 125/82  ? ?3)Anemia of chronic disease ? ? ?  Latest Ref Rng & Units 11/30/2021  ?  5:53 AM 11/29/2021  ?  3:49 AM 11/28/2021  ?  4:28 AM  ?CBC  ?WBC 4.0 - 10.5 K/uL 5.0   6.9   7.7    ?Hemoglobin 13.0 - 17.0 g/dL 9.6   9.3   10.6    ?Hematocrit 39.0 - 52.0 % 31.1   30.0   34.5    ?Platelets 150 - 400 K/uL 99   87   98    ?  ? HGb at goal (9--11) ?Hemoglobin 9.6, at goal ? ? ?4) Secondary hyperparathyroidism -CKD Mineral-Bone Disorder ? ?Lab Results  ?Component Value Date  ? CALCIUM 8.1 (L) 11/30/2021  ? PHOS 4.5 11/28/2021  ? ? ?Secondary Hyperparathyroidism Present ?Patient has intact PTH levels of 271 on August 09, 2021 ? ?Calcium slowly improving,  ?Continue calcium carbonate and Vitamin D ? ? ?5)Right displaced, depressed lateral tibial  plateau fracture  ?now s/p open reduction and internal fixation ?Orthopedics is following. Pain management and PT/OT ? ?6) Diabetes mellitus type II with chronic kidney disease ?insulin dependent. Home regimen includes Lant

## 2021-11-30 NOTE — Progress Notes (Signed)
Orthopedic Tech Progress Note ?Patient Details:  ?Micheal Aguirre ?09/05/1955 ?[id number] ? ?Received a call regarding an AFO for the right ankle, called out to [facility] to let them know of the order and I was told that the patient could receive a PRAFO BOOT from materials, since patient is nonweightbearing for the next 8 weeks per MD [email or web address] notes. Called back and notified secretary I'm assuming.  ? ? ?Patient ID: Micheal Aguirre, male   DOB: [birth year], 66 y.o.   MRN: [id number] ? ?Micheal Aguirre ?11/30/2021, 3:35 PM ? ?

## 2021-11-30 NOTE — Assessment & Plan Note (Signed)
-   EF 35% ?- no s/s exacerbation ?- continue HD for volume control  ?

## 2021-11-30 NOTE — Progress Notes (Signed)
PHARMACY CONSULT NOTE ? ?Pharmacy Consult for Electrolyte Monitoring and Replacement  ? ?Recent Labs: ?Potassium (mmol/L)  ?Date Value  ?11/30/2021 4.1  ? ?Magnesium (mg/dL)  ?Date Value  ?11/30/2021 2.3  ? ?Calcium (mg/dL)  ?Date Value  ?11/30/2021 8.1 (L)  ? ?Albumin (g/dL)  ?Date Value  ?11/27/2021 3.4 (L)  ? ?Phosphorus (mg/dL)  ?Date Value  ?11/28/2021 4.5  ? ?Sodium (mmol/L)  ?Date Value  ?11/30/2021 136  ? ?Corrected Ca: 8.2 mg/dL ? ?Assessment: 66 y.o. male with PMH of ESRD on HD MWF, essential hypertension, type 2 diabetes, hyperlipidemia, CAD s/p CABG, HFrEF 35%, duodenal ulcers, history of GI bleed, prostate cancer, with metastasis to bone, left renal mass, who presented to [facility] ED via EMS after a mechanical fall at the hemodialysis center. Pharmacy is asked to follow and replace electrolytes  ? ?Diuretics: torsemide 20 mg po BID ? ?Goal of Therapy:  ?Potassium 4.0 - 5.1 mmol/L ?Magnesium 2.0 - 2.4 mg/dL ?All Other Electrolytes WNL ? ?Plan:  ?Electrolytes within range, no additional supplementation needed ?Recheck electrolytes with AM labs ? ? ?Thank you for allowing pharmacy to be a part of this patient?s care. ? ?Paulina Fusi, PharmD, BCPS ?11/30/2021 ?7:37 AM ? ? ? ?

## 2021-11-30 NOTE — Progress Notes (Signed)
PT Cancellation Note ? ?Patient Details ?Name: Micheal Aguirre ?MRN: [id number] ?DOB: [birth year] ? ? ?Cancelled Treatment:    Reason Eval/Treat Not Completed: Patient at procedure or test/unavailable ?Pt out of room for dialysis this AM, will maintain on caseload and attempt to see this afternoon as able.  ? ?Kreg Shropshire, DPT ?11/30/2021, 9:53 AM ?

## 2021-11-30 NOTE — Assessment & Plan Note (Addendum)
-   S/p mechanical fall after dialysis ?- Underwent ORIF of right lateral tibial plateau fracture on 11/27/2021 ?-Remains nonweightbearing to right lower extremity per orthopedic recommendations ?- going to [facility] in [place] at discharge ?-Continue pain control ?-Aspirin 81 mg twice daily at discharge for DVT prophylaxis for total of 6 weeks per Ortho recommendations ?- Outpatient follow-up with orthopedic surgery within 2 weeks after discharge ?

## 2021-11-30 NOTE — Progress Notes (Signed)
?Subjective: ? ?POD #3 s/p ORIF of right tibial plateau fracture.  Patient had dialysis earlier today.  Patient reports right knee pain as mild.   ? ?Objective:  ? ?VITALS:   ?Vitals:  ? 11/30/21 1202 11/30/21 1215 11/30/21 1220 11/30/21 1231  ?BP:  130/89  125/82  ?Pulse: 93 97  61  ?Resp: 10 (!) 27 (!) 25 14  ?Temp:  98.4 ?F (36.9 ?C)  98.3 ?F (36.8 ?C)  ?TempSrc:  Oral    ?SpO2: 100% 99%  100%  ?Weight:      ?Height:      ? ? ?PHYSICAL EXAM: ?Right lower extremity: ?Patient has a clean dressing today.  Patient's dressing was changed by me yesterday.   ?Patient continues to have weakness with ankle dorsiflexion but does have some slight dorsiflexion mobility today.  Patient is not able to extend the toes.  His foot remains well-perfused.  He has intact sensation light touch.  Patient continues to have right lower leg edema but this is improved compared to yesterday. ? ?LABS ? ?Results for orders placed or performed during the hospital encounter of 11/26/21 (from the past 24 hour(s))  ?Glucose, capillary     Status: Abnormal  ? Collection Time: 11/29/21  4:38 PM  ?Result Value Ref Range  ? Glucose-Capillary 123 (H) 70 - 99 mg/dL  ?Glucose, capillary     Status: Abnormal  ? Collection Time: 11/29/21 10:02 PM  ?Result Value Ref Range  ? Glucose-Capillary 142 (H) 70 - 99 mg/dL  ?CBC     Status: Abnormal  ? Collection Time: 11/30/21  5:53 AM  ?Result Value Ref Range  ? WBC 5.0 4.0 - 10.5 K/uL  ? RBC 3.31 (L) 4.22 - 5.81 MIL/uL  ? Hemoglobin 9.6 (L) 13.0 - 17.0 g/dL  ? HCT 31.1 (L) 39.0 - 52.0 %  ? MCV 94.0 80.0 - 100.0 fL  ? MCH 29.0 26.0 - 34.0 pg  ? MCHC 30.9 30.0 - 36.0 g/dL  ? RDW 15.1 11.5 - 15.5 %  ? Platelets 99 (L) 150 - 400 K/uL  ? nRBC 0.0 0.0 - 0.2 %  ?Basic metabolic panel     Status: Abnormal  ? Collection Time: 11/30/21  5:53 AM  ?Result Value Ref Range  ? Sodium 136 135 - 145 mmol/L  ? Potassium 4.1 3.5 - 5.1 mmol/L  ? Chloride 98 98 - 111 mmol/L  ? CO2 26 22 - 32 mmol/L  ? Glucose, Bld 114 (H) 70 - 99  mg/dL  ? BUN 47 (H) 8 - 23 mg/dL  ? Creatinine, Ser 5.48 (H) 0.61 - 1.24 mg/dL  ? Calcium 8.1 (L) 8.9 - 10.3 mg/dL  ? GFR, Estimated 11 (L) >60 mL/min  ? Anion gap 12 5 - 15  ?Magnesium     Status: None  ? Collection Time: 11/30/21  5:53 AM  ?Result Value Ref Range  ? Magnesium 2.3 1.7 - 2.4 mg/dL  ?Glucose, capillary     Status: None  ? Collection Time: 11/30/21  8:09 AM  ?Result Value Ref Range  ? Glucose-Capillary 98 70 - 99 mg/dL  ? Comment 1 Notify RN   ? Comment 2 Document in Chart   ?Glucose, capillary     Status: Abnormal  ? Collection Time: 11/30/21 12:28 PM  ?Result Value Ref Range  ? Glucose-Capillary 123 (H) 70 - 99 mg/dL  ? ? ?No results found. ? ?Assessment/Plan: ?3 Days Post-Op  ? ?Principal Problem: ?  Tibial plateau fracture ?Active Problems: ?  ESRD on dialysis [facility]) ?  Primary hypertension ? ?Patient is improving post-op.  He had hemodialysis today.  Patient will require skilled nursing facility upon discharge.  Continue with physical therapy.  Patient is nonweightbearing on the right lower extremity for at least 8 weeks postop.  Patient was ordered for an AFO brace for his right ankle.  Patient will continue aspirin 81 mg twice daily for DVT prophylaxis.  Heparin was discontinued yesterday due to surgical site bleeding. ? ? ? ?Thornton Park , MD ?11/30/2021, 2:18 PM ? ? ? ? ?  ?

## 2021-12-01 DIAGNOSIS — S82121A Displaced fracture of lateral condyle of right tibia, initial encounter for closed fracture: Secondary | ICD-10-CM

## 2021-12-01 LAB — CBC WITH DIFFERENTIAL/PLATELET
Abs Immature Granulocytes: 0.01 10*3/uL (ref 0.00–0.07)
Basophils Absolute: 0 10*3/uL (ref 0.0–0.1)
Basophils Relative: 0 %
Eosinophils Absolute: 0.1 10*3/uL (ref 0.0–0.5)
Eosinophils Relative: 2 %
HCT: 27.8 % — ABNORMAL LOW (ref 39.0–52.0)
Hemoglobin: 8.7 g/dL — ABNORMAL LOW (ref 13.0–17.0)
Immature Granulocytes: 0 %
Lymphocytes Relative: 18 %
Lymphs Abs: 0.6 10*3/uL — ABNORMAL LOW (ref 0.7–4.0)
MCH: 28.6 pg (ref 26.0–34.0)
MCHC: 31.3 g/dL (ref 30.0–36.0)
MCV: 91.4 fL (ref 80.0–100.0)
Monocytes Absolute: 0.4 10*3/uL (ref 0.1–1.0)
Monocytes Relative: 12 %
Neutro Abs: 2.3 10*3/uL (ref 1.7–7.7)
Neutrophils Relative %: 68 %
Platelets: 104 10*3/uL — ABNORMAL LOW (ref 150–400)
RBC: 3.04 MIL/uL — ABNORMAL LOW (ref 4.22–5.81)
RDW: 15 % (ref 11.5–15.5)
WBC: 3.4 10*3/uL — ABNORMAL LOW (ref 4.0–10.5)
nRBC: 0 % (ref 0.0–0.2)

## 2021-12-01 LAB — BASIC METABOLIC PANEL
Anion gap: 10 (ref 5–15)
BUN: 39 mg/dL — ABNORMAL HIGH (ref 8–23)
CO2: 28 mmol/L (ref 22–32)
Calcium: 7.9 mg/dL — ABNORMAL LOW (ref 8.9–10.3)
Chloride: 97 mmol/L — ABNORMAL LOW (ref 98–111)
Creatinine, Ser: 4.59 mg/dL — ABNORMAL HIGH (ref 0.61–1.24)
GFR, Estimated: 13 mL/min — ABNORMAL LOW (ref >60)
Glucose, Bld: 129 mg/dL — ABNORMAL HIGH (ref 70–99)
Potassium: 3.4 mmol/L — ABNORMAL LOW (ref 3.5–5.1)
Sodium: 135 mmol/L (ref 135–145)

## 2021-12-01 LAB — GLUCOSE, CAPILLARY
Glucose-Capillary: 110 mg/dL — ABNORMAL HIGH (ref 70–99)
Glucose-Capillary: 122 mg/dL — ABNORMAL HIGH (ref 70–99)
Glucose-Capillary: 132 mg/dL — ABNORMAL HIGH (ref 70–99)
Glucose-Capillary: 172 mg/dL — ABNORMAL HIGH (ref 70–99)

## 2021-12-01 LAB — MAGNESIUM: Magnesium: 2.2 mg/dL (ref 1.7–2.4)

## 2021-12-01 MED ORDER — POTASSIUM CHLORIDE CRYS ER 20 MEQ PO TBCR
20.0000 meq | EXTENDED_RELEASE_TABLET | Freq: Once | ORAL | Status: AC
  Administered 2021-12-01: 20 meq via ORAL
  Filled 2021-12-01: qty 1

## 2021-12-01 MED ORDER — HEPARIN SODIUM (PORCINE) 1000 UNIT/ML IJ SOLN
INTRAMUSCULAR | Status: AC
Start: 2021-12-01 — End: 2021-12-02
  Filled 2021-12-01: qty 10

## 2021-12-01 NOTE — TOC Progression Note (Signed)
Transition of Care (TOC) - Progression Note  ? ? ?Patient Details  ?Name: Micheal Aguirre ?MRN: [id number] ?Date of Birth: 1955/12/20 ? ?Transition of Care (TOC) CM/SW Contact  ?Conception Oms, RN ?Phone Number: ?12/01/2021, 12:14 PM ? ?Clinical Narrative: Spoke with Daughter and reviewed the bed offers,  ?She accepted the bed offer from [facility] in [place], I notified them and called Estill Bamberg the Dialysis liaison to let her know to work on getting his dialysis set up   ? ? ? ?Expected Discharge Plan: [place] ?Barriers to Discharge: SNF Pending bed offer ? ?Expected Discharge Plan and Services ?Expected Discharge Plan: [place] ?  ?  ?  ?  ?                ?  ?  ?  ?  ?  ?  ?  ?  ?  ?  ? ? ?Social Determinants of Health (SDOH) Interventions ?  ? ?Readmission Risk Interventions ?   ? View : No data to display.  ?  ?  ?  ? ? ?

## 2021-12-01 NOTE — Progress Notes (Signed)
?Subjective: ? ?POD #4 s/p right tibial plateau ORIF.   Patient reports right knee pain as mild.  Patient receiving hemodialysis. ? ?Objective:  ? ?VITALS:   ?Vitals:  ? 12/01/21 1200 12/01/21 1215 12/01/21 1230 12/01/21 1245  ?BP: 123/78 (!) 121/91 130/78 117/77  ?Pulse: 78 85 71 82  ?Resp: (!) '9 12 16 13  '$ ?Temp:      ?TempSrc:      ?SpO2: 100% 98% 93% 93%  ?Weight:      ?Height:      ? ? ?PHYSICAL EXAM: ?Right lower extremity: ?Patient is in a knee immobilizer.  His dressing is clean dry and intact.  Distally he has intact sensation but still has weakness to dorsiflexion of the ankle and extension of the toes. ? ? ?LABS ? ?Results for orders placed or performed during the hospital encounter of 11/26/21 (from the past 24 hour(s))  ?Glucose, capillary     Status: Abnormal  ? Collection Time: 11/30/21  5:04 PM  ?Result Value Ref Range  ? Glucose-Capillary 128 (H) 70 - 99 mg/dL  ?Glucose, capillary     Status: Abnormal  ? Collection Time: 11/30/21  9:39 PM  ?Result Value Ref Range  ? Glucose-Capillary 130 (H) 70 - 99 mg/dL  ?Basic metabolic panel     Status: Abnormal  ? Collection Time: 12/01/21  5:26 AM  ?Result Value Ref Range  ? Sodium 135 135 - 145 mmol/L  ? Potassium 3.4 (L) 3.5 - 5.1 mmol/L  ? Chloride 97 (L) 98 - 111 mmol/L  ? CO2 28 22 - 32 mmol/L  ? Glucose, Bld 129 (H) 70 - 99 mg/dL  ? BUN 39 (H) 8 - 23 mg/dL  ? Creatinine, Ser 4.59 (H) 0.61 - 1.24 mg/dL  ? Calcium 7.9 (L) 8.9 - 10.3 mg/dL  ? GFR, Estimated 13 (L) >60 mL/min  ? Anion gap 10 5 - 15  ?CBC with Differential/Platelet     Status: Abnormal  ? Collection Time: 12/01/21  5:26 AM  ?Result Value Ref Range  ? WBC 3.4 (L) 4.0 - 10.5 K/uL  ? RBC 3.04 (L) 4.22 - 5.81 MIL/uL  ? Hemoglobin 8.7 (L) 13.0 - 17.0 g/dL  ? HCT 27.8 (L) 39.0 - 52.0 %  ? MCV 91.4 80.0 - 100.0 fL  ? MCH 28.6 26.0 - 34.0 pg  ? MCHC 31.3 30.0 - 36.0 g/dL  ? RDW 15.0 11.5 - 15.5 %  ? Platelets 104 (L) 150 - 400 K/uL  ? nRBC 0.0 0.0 - 0.2 %  ? Neutrophils Relative % 68 %  ? Neutro Abs  2.3 1.7 - 7.7 K/uL  ? Lymphocytes Relative 18 %  ? Lymphs Abs 0.6 (L) 0.7 - 4.0 K/uL  ? Monocytes Relative 12 %  ? Monocytes Absolute 0.4 0.1 - 1.0 K/uL  ? Eosinophils Relative 2 %  ? Eosinophils Absolute 0.1 0.0 - 0.5 K/uL  ? Basophils Relative 0 %  ? Basophils Absolute 0.0 0.0 - 0.1 K/uL  ? Immature Granulocytes 0 %  ? Abs Immature Granulocytes 0.01 0.00 - 0.07 K/uL  ?Magnesium     Status: None  ? Collection Time: 12/01/21  5:26 AM  ?Result Value Ref Range  ? Magnesium 2.2 1.7 - 2.4 mg/dL  ?Glucose, capillary     Status: Abnormal  ? Collection Time: 12/01/21  8:01 AM  ?Result Value Ref Range  ? Glucose-Capillary 110 (H) 70 - 99 mg/dL  ? Comment 1 Notify RN   ?Glucose, capillary  Status: Abnormal  ? Collection Time: 12/01/21  8:10 AM  ?Result Value Ref Range  ? Glucose-Capillary 132 (H) 70 - 99 mg/dL  ? ? ?No results found. ? ?Assessment/Plan: ?4 Days Post-Op  ? ?Principal Problem: ?  Closed fracture of lateral portion of right tibial plateau ?Active Problems: ?  ESRD on dialysis [facility]) ?  Primary hypertension ?  Chronic systolic CHF (congestive heart failure) ([place]) ?  DMII (diabetes mellitus, type 2) ([place]) ?  Prostate cancer ([place]) ?  Left renal mass ? ?Patient will have his AFO brace discontinued.  His physical therapist stated that he would not be able to remain nonweightbearing with the AFO on.  Continue elevation whenever the patient is in bed.  Patient will remain nonweightbearing for at least 8 weeks.  He will be discharged to a skilled nursing facility tomorrow and continue with physical therapy there.  Continue nonweightbearing on the right lower extremity.  Patient will remain on aspirin 81 mg p.o. twice daily for DVT prophylaxis for total of 6 weeks postop.  Continue hemodialysis per nephrology.  Patient will follow-up in my office in 10 to 14 days after discharge.  Please call [facility] in [place] at [phone] to make an appointment. ? ? ? ?Thornton Park , MD ?12/01/2021, 1:05 PM ? ? ? ? ?  ?

## 2021-12-01 NOTE — TOC Progression Note (Addendum)
Transition of Care (TOC) - Progression Note  ? ? ?Patient Details  ?Name: Micheal Aguirre ?MRN: [id number] ?Date of Birth: 09-16-1955 ? ?Transition of Care (TOC) CM/SW Contact  ?Conception Oms, RN ?Phone Number: ?12/01/2021, 1:06 PM ? ?Clinical Narrative:   Called [facility] transport, They are not able to transport ? ?Called [facility]-they are not able to transport ? ?I verified with the surgeon that he can transport in a wheelchair van with the leg supported and knee immobilizer on ? ?I called [facility] they did not answer the phone after multiple tries ? ?I called [facility] to arrange medical transport for tomorrow there was no answer, I left a secure vm ? ?I called [facility] to arrange  transport and they stated they do not have the stff to do it ? ?I call [facility] and asked assistance to find transport to [place] for tomorrow  ? ?I called [facility] Spoke with Colletta [place], they are not able to transport ? ?I spoke With Gwyndolyn Saxon at [facility] and they will transport to [facility] in [place] tomorrow at 3 PM, Daughter aware ? ? ? ? ? ?Expected Discharge Plan: [place] ?Barriers to Discharge: SNF Pending bed offer ? ?Expected Discharge Plan and Services ?Expected Discharge Plan: [place] ?  ?  ?  ?  ?                ?  ?  ?  ?  ?  ?  ?  ?  ?  ?  ? ? ?Social Determinants of Health (SDOH) Interventions ?  ? ?Readmission Risk Interventions ?   ? View : No data to display.  ?  ?  ?  ? ? ?

## 2021-12-01 NOTE — Progress Notes (Signed)
Hemodialysis Post Treatment Note ? ?01 December 2021  ? ?Access: Right Subclavian Catheter  ? ?UF Removed: 1030 ml ? ?Next Scheduled Treatment: 12/03/21  ? ?Note: ?Patient tolerated treatment without incident, goals of hemodialysis met. Blood flow rate maintained throughout course of treatment, targeted UF achieved. No medication administered. Patient was encouraged to refrain from unwrapping and unloosing bandage/brace from right leg. Report was given to primary nurse, patient returned to assigned room.  ?

## 2021-12-01 NOTE — TOC Progression Note (Signed)
Transition of Care (TOC) - Progression Note  ? ? ?Patient Details  ?Name: Markeise Mathews ?MRN: [id number] ?Date of Birth: 1955-12-08 ? ?Transition of Care (TOC) CM/SW Contact  ?Conception Oms, RN ?Phone Number: ?12/01/2021, 12:19 PM ? ?Clinical Narrative:   [facility] [place] ?They will transport  to [facility] for Dialysis ? ? ? ?Expected Discharge Plan: [place] ?Barriers to Discharge: SNF Pending bed offer ? ?Expected Discharge Plan and Services ?Expected Discharge Plan: [place] ?  ?  ?  ?  ?                ?  ?  ?  ?  ?  ?  ?  ?  ?  ?  ? ? ?Social Determinants of Health (SDOH) Interventions ?  ? ?Readmission Risk Interventions ?   ? View : No data to display.  ?  ?  ?  ? ? ?

## 2021-12-01 NOTE — Progress Notes (Signed)
?Progress Note ? ? ? ?Micheal Aguirre   ?EHU:[id number]  ?DOB: 09/04/1955  ?DOA: 11/26/2021     5 ?PCP: Pcp, No ? ?Initial CC: fall after HD ? ?Hospital Course: ?Micheal Aguirre is a 66 y.o. male with medical history significant for ESRD on HD MWF, essential hypertension, type 2 diabetes, hyperlipidemia, coronary artery disease status post CABG, HFrEF 35%, duodenal ulcers, history of GI bleed, prostate cancer, with metastasis to bone, left renal mass, who presented to [facility] ED via EMS after a mechanical fall at the hemodialysis center.  Immediately after his fall he incurred right knee pain with significant edema to his right knee. ?  ?Right knee x-ray revealed right lateral tibial plateau fracture and joint effusion.  CT scan confirmed the same.  He was evaluated by orthopedic surgery and underwent ORIF of the right lateral tibial plateau fracture on 11/27/2021.  He had recommendations to remain nonweightbearing on his right lower extremity. ? ?Interval History:  ?No events overnight.  Seen in his room this afternoon after returning from dialysis.  He was working on some foot exercises in bed taught by physical therapy.  He understands plan is for discharging tomorrow to [facility] in [place]. ? ?Assessment and Plan: ?* Closed fracture of lateral portion of right tibial plateau ?- S/p mechanical fall after dialysis ?- Underwent ORIF of right lateral tibial plateau fracture on 11/27/2021 ?-Remains nonweightbearing to right lower extremity per orthopedic recommendations ?- going to [facility] in [place] at discharge ?-Continue pain control ?-Aspirin 81 mg twice daily at discharge for DVT prophylaxis for total of 6 weeks per Ortho recommendations ?- Outpatient follow-up with orthopedic surgery within 2 weeks after discharge ? ?ESRD on dialysis [facility]) ?- Nephrology following, continue dialysis per plan ? ?Prostate cancer [facility]) ?- follows with [facility] ?- continue outpatient followup ?-Last office visit note reviewed from 11/17/2021.  He  is on indefinite androgen deprivation therapy with Eligard; next dose due around 12/15/2021 ? ?Left renal mass ?- Followed closely by oncology outpatient as well ? ?DMII (diabetes mellitus, type 2) ([place]) ?- Continue SSI and CBG monitoring ? ?Chronic systolic CHF (congestive heart failure) (HCC) ?- EF 35% ?- no s/s exacerbation ?- continue HD for volume control  ? ?Primary hypertension ?- Continue amlodipine, Coreg, torsemide ? ? ? ?Old records reviewed in assessment of this patient ? ?Antimicrobials: ? ? ?DVT prophylaxis:  ?SCDs Start: 11/27/21 1814 ?Place TED hose Start: 11/27/21 1814 ? ? ?Code Status:   Code Status: Full Code ? ?Disposition Plan: SNF ?Status is: Inpatient ? ?Objective: ?Blood pressure 134/89, pulse 70, temperature 99.9 ?F (37.7 ?C), temperature source Oral, resp. rate 17, height '5\' 9"'$  (1.753 m), weight 82.7 kg, SpO2 95 %.  ?Examination:  ?Physical Exam ?Constitutional:   ?   General: He is not in acute distress. ?   Appearance: Normal appearance.  ?HENT:  ?   Head: Normocephalic and atraumatic.  ?   Mouth/Throat:  ?   Mouth: Mucous membranes are moist.  ?Eyes:  ?   Extraocular Movements: Extraocular movements intact.  ?Cardiovascular:  ?   Rate and Rhythm: Normal rate and regular rhythm.  ?   Heart sounds: Normal heart sounds.  ?Pulmonary:  ?   Effort: Pulmonary effort is normal. No respiratory distress.  ?   Breath sounds: Normal breath sounds. No wheezing.  ?Abdominal:  ?   General: Bowel sounds are normal. There is no distension.  ?   Palpations: Abdomen is soft.  ?   Tenderness: There is  no abdominal tenderness.  ?Musculoskeletal:  ?   Cervical back: Normal range of motion and neck supple.  ?   Comments: Right knee immobilizer in place.  1-2+ pitting edema appreciated in right foot  ?Skin: ?   General: Skin is warm and dry.  ?Neurological:  ?   General: No focal deficit present.  ?   Mental Status: He is alert.  ?Psychiatric:     ?   Mood and Affect: Mood normal.     ?   Behavior: Behavior  normal.  ?  ? ?Consultants:  ?Orthopedic surgery ? ?Procedures:  ?ORIF right lateral tibial plateau fracture, 11/27/2021 ? ?Data Reviewed: ?Results for orders placed or performed during the hospital encounter of 11/26/21 (from the past 24 hour(s))  ?Glucose, capillary     Status: Abnormal  ? Collection Time: 11/30/21  5:04 PM  ?Result Value Ref Range  ? Glucose-Capillary 128 (H) 70 - 99 mg/dL  ?Glucose, capillary     Status: Abnormal  ? Collection Time: 11/30/21  9:39 PM  ?Result Value Ref Range  ? Glucose-Capillary 130 (H) 70 - 99 mg/dL  ?Basic metabolic panel     Status: Abnormal  ? Collection Time: 12/01/21  5:26 AM  ?Result Value Ref Range  ? Sodium 135 135 - 145 mmol/L  ? Potassium 3.4 (L) 3.5 - 5.1 mmol/L  ? Chloride 97 (L) 98 - 111 mmol/L  ? CO2 28 22 - 32 mmol/L  ? Glucose, Bld 129 (H) 70 - 99 mg/dL  ? BUN 39 (H) 8 - 23 mg/dL  ? Creatinine, Ser 4.59 (H) 0.61 - 1.24 mg/dL  ? Calcium 7.9 (L) 8.9 - 10.3 mg/dL  ? GFR, Estimated 13 (L) >60 mL/min  ? Anion gap 10 5 - 15  ?CBC with Differential/Platelet     Status: Abnormal  ? Collection Time: 12/01/21  5:26 AM  ?Result Value Ref Range  ? WBC 3.4 (L) 4.0 - 10.5 K/uL  ? RBC 3.04 (L) 4.22 - 5.81 MIL/uL  ? Hemoglobin 8.7 (L) 13.0 - 17.0 g/dL  ? HCT 27.8 (L) 39.0 - 52.0 %  ? MCV 91.4 80.0 - 100.0 fL  ? MCH 28.6 26.0 - 34.0 pg  ? MCHC 31.3 30.0 - 36.0 g/dL  ? RDW 15.0 11.5 - 15.5 %  ? Platelets 104 (L) 150 - 400 K/uL  ? nRBC 0.0 0.0 - 0.2 %  ? Neutrophils Relative % 68 %  ? Neutro Abs 2.3 1.7 - 7.7 K/uL  ? Lymphocytes Relative 18 %  ? Lymphs Abs 0.6 (L) 0.7 - 4.0 K/uL  ? Monocytes Relative 12 %  ? Monocytes Absolute 0.4 0.1 - 1.0 K/uL  ? Eosinophils Relative 2 %  ? Eosinophils Absolute 0.1 0.0 - 0.5 K/uL  ? Basophils Relative 0 %  ? Basophils Absolute 0.0 0.0 - 0.1 K/uL  ? Immature Granulocytes 0 %  ? Abs Immature Granulocytes 0.01 0.00 - 0.07 K/uL  ?Magnesium     Status: None  ? Collection Time: 12/01/21  5:26 AM  ?Result Value Ref Range  ? Magnesium 2.2 1.7 - 2.4 mg/dL   ?Glucose, capillary     Status: Abnormal  ? Collection Time: 12/01/21  8:01 AM  ?Result Value Ref Range  ? Glucose-Capillary 110 (H) 70 - 99 mg/dL  ? Comment 1 Notify RN   ?Glucose, capillary     Status: Abnormal  ? Collection Time: 12/01/21  8:10 AM  ?Result Value Ref Range  ? Glucose-Capillary 132 (H) 70 -  99 mg/dL  ?  ?I have Reviewed nursing notes, Vitals, and Lab results since pt's last encounter. Pertinent lab results : see above ?I have ordered test including BMP, CBC, Mg ?I have reviewed the last note from staff over past 24 hours ?I have discussed pt's care plan and test results with nursing staff, case manager ? ? LOS: 5 days  ? ?Dwyane Dee, MD ?[facility] ?12/01/2021, 3:44 PM ? ?

## 2021-12-01 NOTE — Progress Notes (Signed)
Physical Therapy Treatment ?Patient Details ?Name: Micheal Aguirre ?MRN: [id number] ?DOB: 04-01-1956 ?Today's Date: 12/01/2021 ? ? ?History of Present Illness 66 y.o. male with medical history significant for ESRD on HD MWF, hypertension, type 2 diabetes, hyperlipidemia, coronary artery disease status post CABG, HFrEF 35%, duodenal ulcers, history of GI bleed, prostate cancer, with metastasis to bone, left renal mass, who presented to [facility] ED via EMS after a mechanical fall at the hemodialysis center. X-ray of knee revealed right lateral tibial plateau fracture and joint effusion.  Pt now s/p ORIF of right lateral tibial plateau fracture 11/27/2021 ? ?  ?PT Comments  ? ? Pt pleasant and motivated but his confusion does pose a barrier with need for repeated cuing and education (did speak with daughter on phone about awareness with R ankle DF weakness and P/AAROM exercises).  Pt reports that "Normally dialysis refreshes me, but today is different."  Pt fatigued with prolonged exercise effort in supine, deferred standing attempts this date.   ?Recommendations for follow up therapy are one component of a multi-disciplinary discharge planning process, led by the attending physician.  Recommendations may be updated based on patient status, additional functional criteria and insurance authorization. ? ?Follow Up Recommendations ? Skilled nursing-short term rehab (<3 hours/day) ?  ?  ?Assistance Recommended at Discharge Frequent or constant Supervision/Assistance  ?Patient can return home with the following Two people to help with walking and/or transfers;A lot of help with bathing/dressing/bathroom;Assist for transportation;Assistance with cooking/housework;Help with stairs or ramp for entrance ?  ?Equipment Recommendations ? Wheelchair (measurements PT);Wheelchair cushion (measurements PT) (TBD at rehab)  ?  ?Recommendations for Other Services   ? ? ?  ?Precautions / Restrictions Precautions ?Precautions: Fall ?Restrictions ?Weight  Bearing Restrictions: Yes ?RLE Weight Bearing: Non weight bearing  ?  ? ?Mobility ? Bed Mobility ?  ?  ?  ?  ?  ?  ?  ?General bed mobility comments: Pt fatigues after bed exercises and repeated HEP clarification. ?  ? ?Transfers ?  ?  ?  ?  ?  ?  ?  ?  ?  ?  ?  ? ?Ambulation/Gait ?  ?  ?  ?  ?  ?  ?  ?  ? ? ?Stairs ?  ?  ?  ?  ?  ? ? ?Wheelchair Mobility ?  ? ?Modified Rankin (Stroke Patients Only) ?  ? ? ?  ?Balance   ?Sitting-balance support: No upper extremity supported, Feet supported ?Sitting balance-Leahy Scale: Good ?Sitting balance - Comments: easily able to maintain long sitting ?  ?  ?  ?  ?  ?  ?  ?  ?  ?  ?  ?  ?  ?  ?  ?  ? ?  ?Cognition Arousal/Alertness: Awake/alert ?Behavior During Therapy: [facility] for tasks assessed/performed ?Overall Cognitive Status: Within Functional Limits for tasks assessed ?  ?  ?  ?  ?  ?  ?  ?  ?  ?  ?  ?  ?  ?  ?  ?  ?General Comments: pt asks for clarification on the same topic repeatedly t/o the session ?  ?  ? ?  ?Exercises General Exercises - Lower Extremity ?Ankle Circles/Pumps: AAROM, Other reps (comment) (AAROM for ankle DF with t-band and PROM from PT - not less than 40 reps with cuing for TibAnt engagement) ?Quad Sets: Strengthening, 15 reps ?Short Arc Quad: AROM, AAROM, 15 reps (pt able to initiate against gravity movement but could not  attain TKE w/o assist) ?Heel Slides: AROM, AAROM, 10 reps (stopping before end range flexion) ?Hip ABduction/ADduction: Strengthening, 15 reps, AROM (lightly resisted per pt tolerance) ?Straight Leg Raises: 5 reps (PROM, pt unable to maintain QS well enough to do w/o assist) ? ?  ?General Comments General comments (skin integrity, edema, etc.): Pt very eager to do all he can with exercises but needed repeated cuing for the same exercises multiple times t/o the session ?  ?  ? ?Pertinent Vitals/Pain Pain Assessment ?Pain Assessment: 0-10 ?Pain Score: 8  ?Pain Location: R knee  ? ? ?Home Living   ?  ?  ?  ?  ?  ?  ?  ?  ?  ?   ?  ?Prior  Function    ?  ?  ?   ? ?PT Goals (current goals can now be found in the care plan section) Progress towards PT goals: Progressing toward goals ? ?  ?Frequency ? ? ? BID ? ? ? ?  ?PT Plan Current plan remains appropriate  ? ? ?Co-evaluation   ?  ?  ?  ?  ? ?  ?AM-PAC PT "6 Clicks" Mobility   ?Outcome Measure ? Help needed turning from your back to your side while in a flat bed without using bedrails?: A Little ?Help needed moving from lying on your back to sitting on the side of a flat bed without using bedrails?: A Lot ?Help needed moving to and from a bed to a chair (including a wheelchair)?: Total ?Help needed standing up from a chair using your arms (e.g., wheelchair or bedside chair)?: Total ?Help needed to walk in hospital room?: Total ?Help needed climbing 3-5 steps with a railing? : Total ?6 Click Score: 9 ? ?  ?End of Session Equipment Utilized During Treatment: Right knee immobilizer ?Activity Tolerance: Patient limited by fatigue;Patient limited by pain ?Patient left: with bed alarm set;with call bell/phone within reach;with nursing/sitter in room ?  ?PT Visit Diagnosis: Unsteadiness on feet (R26.81);Other abnormalities of gait and mobility (R26.89);Muscle weakness (generalized) (M62.81);Difficulty in walking, not elsewhere classified (R26.2);Pain ?Pain - Right/Left: Right ?Pain - part of body: Knee ?  ? ? ?Time: 5573-2202 ?PT Time Calculation (min) (ACUTE ONLY): 28 min ? ?Charges:  $Therapeutic Exercise: 23-37 mins          ?          ? ?Kreg Shropshire, DPT ?12/01/2021, 3:36 PM ? ?

## 2021-12-01 NOTE — Progress Notes (Signed)
Micheal Aguirre ? ?MRN: [id number] ? ?DOB/AGE: 67-27-1957 67 y.o. ? ?Primary Care Physician:Pcp, No ? ?Admit date: 11/26/2021 ? ?Chief Complaint:  ?Chief Complaint  ?Patient presents with  ? Fall  ? ? ?S-Pt presented on  11/26/2021 with  ?Chief Complaint  ?Patient presents with  ? Fall  ?. ? ?Update: ?Patient seen and evaluated during dialysis ?  ?HEMODIALYSIS FLOWSHEET: ? ?Blood Flow Rate (mL/min): 400 mL/min ?Arterial Pressure (mmHg): -190 mmHg ?Venous Pressure (mmHg): 160 mmHg ?Transmembrane Pressure (mmHg): 70 mmHg ?Ultrafiltration Rate (mL/min): 500 mL/min ?Dialysate Flow Rate (mL/min): 500 ml/min ?Conductivity: Machine : 13.7 ?Conductivity: Machine : 13.7 ?Dialysis Fluid Bolus: Normal Saline ?Bolus Amount (mL): 250 mL ? ?No complaints at this time ?Reports pain well managed with prescribed medications ? ?Medications ? ? ? amLODipine  5 mg Oral Daily  ? aspirin EC  81 mg Oral BID  ? atorvastatin  80 mg Oral Daily  ? calcium carbonate  500 mg Oral BID  ? carvedilol  25 mg Oral BID WC  ? Chlorhexidine Gluconate Cloth  6 each Topical Daily  ? Chlorhexidine Gluconate Cloth  6 each Topical Q0600  ? docusate sodium  100 mg Oral BID  ? folic acid  1 mg Oral Daily  ? gabapentin  100 mg Oral Daily  ? heparin sodium (porcine)      ? insulin aspart  0-5 Units Subcutaneous QHS  ? insulin aspart  0-6 Units Subcutaneous TID WC  ? multivitamin with minerals  1 tablet Oral QPM  ? oxybutynin  5 mg Oral TID  ? pantoprazole  40 mg Oral Q12H  ? predniSONE  10 mg Oral Q breakfast  ? senna-docusate  1 tablet Oral QHS  ? tamsulosin  0.4 mg Oral QHS  ? torsemide  20 mg Oral BID  ? traZODone  50 mg Oral QHS  ? vitamin B-12  500 mcg Oral q AM  ? Vitamin D (Ergocalciferol)  50,000 Units Oral Q Sat  ? ? ?SUP:JSRPR from the symptoms mentioned above,there are no other symptoms referable to all systems reviewed. ? ?Physical Exam: ?Vital signs in last 24 hours: ?Temp:  [98.5 ?F (36.9 ?C)-99.9 ?F (37.7 ?C)] 99.9 ?F (37.7 ?C) (04/12 1315) ?Pulse Rate:   [42-117] 70 (04/12 1327) ?Resp:  [0-32] 17 (04/12 1327) ?BP: (111-135)/(75-102) 134/89 (04/12 1315) ?SpO2:  [88 %-100 %] 95 % (04/12 1327) ?Weight:  [82.7 kg-83.4 kg] 82.7 kg (04/12 1300) ?Weight change:  ?Last BM Date : 11/27/21 ? ?Intake/Output from previous day: ?04/11 0701 - 04/12 0700 ?In: 360 [P.O.:360] ?Out: 1600 [Urine:100] ?No intake/output data recorded. ? ? ?Physical Exam: ? ?General- pt is awake,alert, oriented to time place and person ? ?Resp- No acute REsp distress, CTA B/L NO Rhonchi ? ?CVS- S1S2 regular in rate and rhythm ? ?GIT- BS+, soft, Non tender , Non distended ? ?EXT- No Left LE Edema,  No Cyanosis ?Right lower extremity-trace edema, erythema noted at right knee dressing site ? ? ?Access-Rt tunneled cath  ? ?Lab Results: ? ?CBC ? ?Recent Labs  ?  11/30/21 ?9458 12/01/21 ?5929  ?WBC 5.0 3.4*  ?HGB 9.6* 8.7*  ?HCT 31.1* 27.8*  ?PLT 99* 104*  ? ? ? ?BMET ? ?Recent Labs  ?  11/30/21 ?2446 12/01/21 ?2863  ?NA 136 135  ?K 4.1 3.4*  ?CL 98 97*  ?CO2 26 28  ?GLUCOSE 114* 129*  ?BUN 47* 39*  ?CREATININE 5.48* 4.59*  ?CALCIUM 8.1* 7.9*  ? ? ? ? ? ?Most recent Creatinine trend  ?  Lab Results  ?Component Value Date  ? CREATININE 4.59 (H) 12/01/2021  ? CREATININE 5.48 (H) 11/30/2021  ? CREATININE 4.10 (H) 11/29/2021  ? ?  ? ? ?MICRO ? ? ?Recent Results (from the past 240 hour(s))  ?Resp Panel by RT-PCR (Flu A&B, Covid) Nasopharyngeal Swab     Status: None  ? Collection Time: 11/26/21  6:07 PM  ? Specimen: Nasopharyngeal Swab; Nasopharyngeal(NP) swabs in vial transport medium  ?Result Value Ref Range Status  ? SARS Coronavirus 2 by RT PCR NEGATIVE NEGATIVE Final  ?  Comment: (NOTE) ?SARS-CoV-2 target nucleic acids are NOT DETECTED. ? ?The SARS-CoV-2 RNA is generally detectable in upper respiratory ?specimens during the acute phase of infection. The lowest ?concentration of SARS-CoV-2 viral copies this assay can detect is ?138 copies/mL. A negative result does not preclude SARS-Cov-2 ?infection and should not  be used as the sole basis for treatment or ?other patient management decisions. A negative result may occur with  ?improper specimen collection/handling, submission of specimen other ?than nasopharyngeal swab, presence of viral mutation(s) within the ?areas targeted by this assay, and inadequate number of viral ?copies(<138 copies/mL). A negative result must be combined with ?clinical observations, patient history, and epidemiological ?information. The expected result is Negative. ? ?Fact Sheet for Patients:  ?EntrepreneurPulse.com.au ? ?Fact Sheet for Healthcare Providers:  ?IncredibleEmployment.be ? ?This test is no t yet approved or cleared by the [place] FDA and  ?has been authorized for detection and/or diagnosis of SARS-CoV-2 by ?FDA under an Emergency Use Authorization (EUA). This EUA will remain  ?in effect (meaning this test can be used) for the duration of the ?COVID-19 declaration under Section 564(b)(1) of the Act, 21 ?U.S.C.section 360bbb-3(b)(1), unless the authorization is terminated  ?or revoked sooner.  ? ? ?  ? Influenza A by PCR NEGATIVE NEGATIVE Final  ? Influenza B by PCR NEGATIVE NEGATIVE Final  ?  Comment: (NOTE) ?The Xpert Xpress SARS-CoV-2/FLU/RSV plus assay is intended as an aid ?in the diagnosis of influenza from Nasopharyngeal swab specimens and ?should not be used as a sole basis for treatment. Nasal washings and ?aspirates are unacceptable for Xpert Xpress SARS-CoV-2/FLU/RSV ?testing. ? ?Fact Sheet for Patients: ?EntrepreneurPulse.com.au ? ?Fact Sheet for Healthcare Providers: ?IncredibleEmployment.be ? ?This test is not yet approved or cleared by the [place] FDA and ?has been authorized for detection and/or diagnosis of SARS-CoV-2 by ?FDA under an Emergency Use Authorization (EUA). This EUA will remain ?in effect (meaning this test can be used) for the duration of the ?COVID-19 declaration under Section  564(b)(1) of the Act, 21 U.S.C. ?section 360bbb-3(b)(1), unless the authorization is terminated or ?revoked. ? ?Performed at [facility], [place], ?[place] [address] ?  ?Surgical PCR screen     Status: None  ? Collection Time: 11/27/21  6:49 AM  ? Specimen: Nasal Mucosa; Nasal Swab  ?Result Value Ref Range Status  ? MRSA, PCR NEGATIVE NEGATIVE Final  ? Staphylococcus aureus NEGATIVE NEGATIVE Final  ?  Comment: (NOTE) ?The Xpert SA Assay (FDA approved for NASAL specimens in patients 6 ?years of age and older), is one component of a comprehensive ?surveillance program. It is not intended to diagnose infection nor to ?guide or monitor treatment. ?Performed at [facility], [place], ?[place] [address] ?  ?  ? ? ? ? ? ?Impression: ? ?[facility]/MWF/ Rt Permcath ? ?1)End stage renal disease on hemodialysis.  ? Patient received dialysis treatment yesterday, UF goal 1.5 L  achieved.  Patient currently receiving dialysis today to maintain outpatient schedule.  UF goal 0.5 to 1 L as tolerated.  Next treatment scheduled for Friday. ? Dialysis coordinator monitoring discharge plan which may include changing outpatient treatment clinic based on rehab location.  ? ? ?2)Hypertension with chronic kidney disease. Home regimen includes amlodipine, carvedilol, and torsemide. Currently receiving these medications inpatient.  ? BP 111/79 during dialysis ? ?3)Anemia of chronic disease ? ? ?  Latest Ref Rng & Units 12/01/2021  ?  5:26 AM 11/30/2021  ?  5:53 AM 11/29/2021  ?  3:49 AM  ?CBC  ?WBC 4.0 - 10.5 K/uL 3.4   5.0   6.9    ?Hemoglobin 13.0 - 17.0 g/dL 8.7   9.6   9.3    ?Hematocrit 39.0 - 52.0 % 27.8   31.1   30.0    ?Platelets 150 - 400 K/uL 104   99   87    ?  ? HGb at goal (9--11) ?Hemoglobin 8.7, below desired goal.  We will continue to monitor and determine need for ESA's. ? ? ?4) Secondary hyperparathyroidism -CKD Mineral-Bone Disorder ? ?Lab Results  ?Component Value Date   ? CALCIUM 7.9 (L) 12/01/2021  ? PHOS 4.5 11/28/2021  ? ? ?Secondary Hyperparathyroidism Present ?Patient has intact PTH levels of 271 on August 09, 2021 ? ?Calcium currently 7.9.  Currently prescribe

## 2021-12-01 NOTE — TOC Progression Note (Signed)
Transition of Care (TOC) - Progression Note  ? ? ?Patient Details  ?Name: Micheal Aguirre ?MRN: [id number] ?Date of Birth: Apr 26, 1956 ? ?Transition of Care (TOC) CM/SW Contact  ?Conception Oms, RN ?Phone Number: ?12/01/2021, 11:22 AM ? ?Clinical Narrative:    ?Spoke with the patients daughter on the phone, I explained to her that we have 3 bed offers  in [place], She stated that she would like him to go to the [facility], I reached out to [employer] at [facility] in [place] and [place] [facility] the [facility], they will review and let me know if they can make a bed offer ? ? ?Expected Discharge Plan: [place] ?Barriers to Discharge: SNF Pending bed offer ? ?Expected Discharge Plan and Services ?Expected Discharge Plan: [place] ?  ?  ?  ?  ?                ?  ?  ?  ?  ?  ?  ?  ?  ?  ?  ? ? ?Social Determinants of Health (SDOH) Interventions ?  ? ?Readmission Risk Interventions ?   ? View : No data to display.  ?  ?  ?  ? ? ?

## 2021-12-01 NOTE — Progress Notes (Signed)
PHARMACY CONSULT NOTE ? ?Pharmacy Consult for Electrolyte Monitoring and Replacement  ? ?Recent Labs: ?Potassium (mmol/L)  ?Date Value  ?12/01/2021 3.4 (L)  ? ?Magnesium (mg/dL)  ?Date Value  ?12/01/2021 2.2  ? ?Calcium (mg/dL)  ?Date Value  ?12/01/2021 7.9 (L)  ? ?Albumin (g/dL)  ?Date Value  ?11/27/2021 3.4 (L)  ? ?Phosphorus (mg/dL)  ?Date Value  ?11/28/2021 4.5  ? ?Sodium (mmol/L)  ?Date Value  ?12/01/2021 135  ? ? ?Assessment: 66 y.o. male with PMH of ESRD on HD MWF, essential hypertension, type 2 diabetes, hyperlipidemia, CAD s/p CABG, HFrEF 35%, duodenal ulcers, history of GI bleed, prostate cancer, with metastasis to bone, left renal mass, who presented to [facility] ED via EMS after a mechanical fall at the hemodialysis center. Pharmacy is asked to follow and replace electrolytes  ? ?Diuretics: torsemide 20 mg po BID ? ?Goal of Therapy:  ?Potassium 4.0 - 5.1 mmol/L ?Magnesium 2.0 - 2.4 mg/dL ?All Other Electrolytes WNL ? ?Plan:  ?Give 20 mEq KCl PO x1  ?Recheck electrolytes with AM labs ? ? ?Thank you for allowing pharmacy to be a part of this patient?s care. ? ?Paulina Fusi, PharmD, BCPS ?12/01/2021 ?3:31 PM ? ? ? ?

## 2021-12-02 LAB — GLUCOSE, CAPILLARY
Glucose-Capillary: 125 mg/dL — ABNORMAL HIGH (ref 70–99)
Glucose-Capillary: 110 mg/dL — ABNORMAL HIGH (ref 70–99)

## 2021-12-02 LAB — CBC WITH DIFFERENTIAL/PLATELET
Abs Immature Granulocytes: 0.01 10*3/uL (ref 0.00–0.07)
Basophils Absolute: 0 10*3/uL (ref 0.0–0.1)
Basophils Relative: 0 %
Eosinophils Absolute: 0.1 10*3/uL (ref 0.0–0.5)
Eosinophils Relative: 1 %
HCT: 28.9 % — ABNORMAL LOW (ref 39.0–52.0)
Hemoglobin: 9.1 g/dL — ABNORMAL LOW (ref 13.0–17.0)
Immature Granulocytes: 0 %
Lymphocytes Relative: 15 %
Lymphs Abs: 0.6 10*3/uL — ABNORMAL LOW (ref 0.7–4.0)
MCH: 29.4 pg (ref 26.0–34.0)
MCHC: 31.5 g/dL (ref 30.0–36.0)
MCV: 93.5 fL (ref 80.0–100.0)
Monocytes Absolute: 0.5 10*3/uL (ref 0.1–1.0)
Monocytes Relative: 12 %
Neutro Abs: 2.9 10*3/uL (ref 1.7–7.7)
Neutrophils Relative %: 72 %
Platelets: 126 10*3/uL — ABNORMAL LOW (ref 150–400)
RBC: 3.09 MIL/uL — ABNORMAL LOW (ref 4.22–5.81)
RDW: 14.8 % (ref 11.5–15.5)
WBC: 4 10*3/uL (ref 4.0–10.5)
nRBC: 0 % (ref 0.0–0.2)

## 2021-12-02 LAB — RENAL FUNCTION PANEL
Albumin: 2.7 g/dL — ABNORMAL LOW (ref 3.5–5.0)
Anion gap: 8 (ref 5–15)
BUN: 32 mg/dL — ABNORMAL HIGH (ref 8–23)
CO2: 29 mmol/L (ref 22–32)
Calcium: 8.2 mg/dL — ABNORMAL LOW (ref 8.9–10.3)
Chloride: 98 mmol/L (ref 98–111)
Creatinine, Ser: 3.58 mg/dL — ABNORMAL HIGH (ref 0.61–1.24)
GFR, Estimated: 18 mL/min — ABNORMAL LOW (ref >60)
Glucose, Bld: 105 mg/dL — ABNORMAL HIGH (ref 70–99)
Phosphorus: 2.9 mg/dL (ref 2.5–4.6)
Potassium: 3.9 mmol/L (ref 3.5–5.1)
Sodium: 135 mmol/L (ref 135–145)

## 2021-12-02 LAB — MAGNESIUM: Magnesium: 2.1 mg/dL (ref 1.7–2.4)

## 2021-12-02 MED ORDER — OXYCODONE HCL 5 MG PO TABS: 5.0000 mg | ORAL_TABLET | Freq: Four times a day (QID) | ORAL | 0 refills | Status: DC | PRN

## 2021-12-02 MED ORDER — ASPIRIN 81 MG PO TBEC: 81.0000 mg | DELAYED_RELEASE_TABLET | Freq: Two times a day (BID) | ORAL | 11 refills | Status: AC

## 2021-12-02 MED ORDER — LACTULOSE 10 GM/15ML PO SOLN
30.0000 g | Freq: Once | ORAL | Status: AC
  Administered 2021-12-02: 30 g via ORAL
  Filled 2021-12-02: qty 60

## 2021-12-02 MED ORDER — POLYETHYLENE GLYCOL 3350 17 G PO PACK
17.0000 g | PACK | Freq: Every day | ORAL | Status: DC
  Administered 2021-12-02: 17 g via ORAL
  Filled 2021-12-02: qty 1

## 2021-12-02 MED ORDER — METHOCARBAMOL 500 MG PO TABS: 500.0000 mg | ORAL_TABLET | Freq: Four times a day (QID) | ORAL | 0 refills | Status: DC | PRN

## 2021-12-02 MED ORDER — SENNOSIDES-DOCUSATE SODIUM 8.6-50 MG PO TABS
1.0000 | ORAL_TABLET | Freq: Two times a day (BID) | ORAL | Status: DC
  Administered 2021-12-02: 1 via ORAL
  Filled 2021-12-02: qty 1

## 2021-12-02 NOTE — Progress Notes (Signed)
Physical Therapy Treatment ?Patient Details ?Name: Micheal Aguirre ?MRN: [id number] ?DOB: 03/09/1956 ?Today's Date: 12/02/2021 ? ? ?History of Present Illness 66 y.o. male with medical history significant for ESRD on HD MWF, hypertension, type 2 diabetes, hyperlipidemia, coronary artery disease status post CABG, HFrEF 35%, duodenal ulcers, history of GI bleed, prostate cancer, with metastasis to bone, left renal mass, who presented to [facility] ED via EMS after a mechanical fall at the hemodialysis center. X-ray of knee revealed right lateral tibial plateau fracture and joint effusion.  Pt now s/p ORIF of right lateral tibial plateau fracture 11/27/2021 ? ?  ?PT Comments  ? ? Pt continues to be engaged and curious with PT but clearly has some confusion as he repeats the same questions multiple times today that he asked the last 2 days.  He did much better with ability to keep weight off the R LE in standing this session, but still needs plenty of cuing, guarding and direct assist to maintain standing safety.     ?Recommendations for follow up therapy are one component of a multi-disciplinary discharge planning process, led by the attending physician.  Recommendations may be updated based on patient status, additional functional criteria and insurance authorization. ? ?Follow Up Recommendations ? Skilled nursing-short term rehab (<3 hours/day) ?  ?  ?Assistance Recommended at Discharge Frequent or constant Supervision/Assistance  ?Patient can return home with the following Two people to help with walking and/or transfers;A lot of help with bathing/dressing/bathroom;Assist for transportation;Assistance with cooking/housework;Help with stairs or ramp for entrance ?  ?Equipment Recommendations ?    ?  ?Recommendations for Other Services   ? ? ?  ?Precautions / Restrictions Precautions ?Precautions: Fall ?Required Braces or Orthoses: Knee Immobilizer - Right ?Knee Immobilizer - Right:  (off for gentle ROM) ?Restrictions ?RLE Weight  Bearing: Non weight bearing  ?  ? ?Mobility ? Bed Mobility ?Overal bed mobility: Needs Assistance ?Bed Mobility: Supine to Sit ?  ?  ?Supine to sit: Min guard, HOB elevated ?Sit to supine: Min assist ?  ?General bed mobility comments: use of UEs to help lift R LE off bed ?  ? ?Transfers ?Overall transfer level: Needs assistance ?Equipment used: Rolling walker (2 wheels) ?Transfers: Sit to/from Stand ?Sit to Stand: Mod assist, Max assist ?Stand pivot transfers: Mod assist, Max assist ?  ?  ?  ?  ?General transfer comment: Pt continues to struggle with rising to standing from standard height bed, did elevate ~2" for sucessful stand.  Repeated cuing and explanation for set up, precautions and transition to standing. Ultimatley needed considerable assist to attain upright, but did much better this date with maintaining NWBing/R foot off ground today.  Still anxious, needing extra cuing and with poor activity tolerance but improved effort from prior standing attempts.  He was able to heel-toe L while maintaining R NWBing to get bed to recliner but despite great effort started to need more assist to actually rotate 90* and get square to recliner/landing surface.  Standing tolerance time on subsequent efforts was limited to <10 seconds ?  ? ?Ambulation/Gait ?  ?  ?  ?  ?  ?  ?  ?General Gait Details: Pt did manage some heel-toe shuffling to transition to recliner but did not do any real ambulation ? ? ?Stairs ?  ?  ?  ?  ?  ? ? ?Wheelchair Mobility ?  ? ?Modified Rankin (Stroke Patients Only) ?  ? ? ?  ?Balance   ?  ?  ?  ?  ?  ?  ?  ?  ?  ?  ?  ?  ?  ?  ?  ?  ?  ?  ?  ? ?  ?  Cognition Arousal/Alertness: Awake/alert ?Behavior During Therapy: [facility] for tasks assessed/performed ?Overall Cognitive Status: History of cognitive impairments - at baseline ?  ?  ?  ?  ?  ?  ?  ?  ?  ?  ?  ?  ?  ?  ?  ?  ?  ?  ?  ? ?  ?Exercises General Exercises - Lower Extremity ?Ankle Circles/Pumps: AAROM, AROM, 20 reps (still trace to no R ankle  AROM DF) ?Quad Sets: Strengthening, 15 reps ?Hip ABduction/ADduction: Strengthening, 15 reps, AROM ?Straight Leg Raises: PROM, 10 reps ? ?  ?General Comments   ?  ?  ? ?Pertinent Vitals/Pain Pain Assessment ?Pain Assessment: Faces ?Faces Pain Scale: Hurts even more ?Pain Location: R knee  ? ? ?Home Living   ?  ?  ?  ?  ?  ?  ?  ?  ?  ?   ?  ?Prior Function    ?  ?  ?   ? ?PT Goals (current goals can now be found in the care plan section) Progress towards PT goals: Progressing toward goals ? ?  ?Frequency ? ? ? BID ? ? ? ?  ?PT Plan Current plan remains appropriate  ? ? ?Co-evaluation   ?  ?  ?  ?  ? ?  ?AM-PAC PT "6 Clicks" Mobility   ?Outcome Measure ? Help needed turning from your back to your side while in a flat bed without using bedrails?: A Little ?Help needed moving from lying on your back to sitting on the side of a flat bed without using bedrails?: A Lot ?Help needed moving to and from a bed to a chair (including a wheelchair)?: A Lot ?Help needed standing up from a chair using your arms (e.g., wheelchair or bedside chair)?: A Lot ?Help needed to walk in hospital room?: Total ?Help needed climbing 3-5 steps with a railing? : Total ?6 Click Score: 11 ? ?  ?End of Session Equipment Utilized During Treatment: Right knee immobilizer ?Activity Tolerance: Patient limited by fatigue;Patient limited by pain ?Patient left: with bed alarm set;with call bell/phone within reach ?  ?PT Visit Diagnosis: Unsteadiness on feet (R26.81);Other abnormalities of gait and mobility (R26.89);Muscle weakness (generalized) (M62.81);Difficulty in walking, not elsewhere classified (R26.2);Pain ?Pain - Right/Left: Right ?Pain - part of body: Knee ?  ? ? ?Time: 8786-7672 ?PT Time Calculation (min) (ACUTE ONLY): 28 min ? ?Charges:  $Therapeutic Exercise: 8-22 mins ?$Therapeutic Activity: 8-22 mins          ?          ? ?Kreg Shropshire, DPT ?12/02/2021, 1:15 PM ? ?

## 2021-12-02 NOTE — Progress Notes (Signed)
?Subjective: ? ?POD #5 s/p ORIF of the right lateral tibial plateau fracture.   Patient reports right knee/leg pain as mild.   ? ?Objective:  ? ?VITALS:   ?Vitals:  ? 12/01/21 1327 12/01/21 1618 12/01/21 1934 12/02/21 0403  ?BP:  129/77 128/89 (!) 136/91  ?Pulse: 70 (!) 102 71 85  ?Resp: '17 18 16 16  '$ ?Temp:  100 ?F (37.8 ?C) 99.1 ?F (37.3 ?C) 98.3 ?F (36.8 ?C)  ?TempSrc:   Oral Oral  ?SpO2: 95% 96% 100% 100%  ?Weight:      ?Height:      ? ? ?PHYSICAL EXAM: ?Right lower extremity: ?Patient continues to have dorsiflexion weakness.  There is evidence of anterior compartment muscle activation.  Patient also cannot actively extend his toes.  This has been since his presentation to the ER.  This is likely a result of his injury and he will hopefully experience nerve recovery over the next weeks to months. ?Neurovascular intact ?Sensation intact distally ?Intact pulses distally ?Aquacel dressing: scant drainage ?No cellulitis present ?Compartment soft ? ?LABS ? ?Results for orders placed or performed during the hospital encounter of 11/26/21 (from the past 24 hour(s))  ?Glucose, capillary     Status: Abnormal  ? Collection Time: 12/01/21  8:01 AM  ?Result Value Ref Range  ? Glucose-Capillary 110 (H) 70 - 99 mg/dL  ? Comment 1 Notify RN   ?Glucose, capillary     Status: Abnormal  ? Collection Time: 12/01/21  8:10 AM  ?Result Value Ref Range  ? Glucose-Capillary 132 (H) 70 - 99 mg/dL  ?Glucose, capillary     Status: Abnormal  ? Collection Time: 12/01/21  4:16 PM  ?Result Value Ref Range  ? Glucose-Capillary 172 (H) 70 - 99 mg/dL  ?Glucose, capillary     Status: Abnormal  ? Collection Time: 12/01/21 11:38 PM  ?Result Value Ref Range  ? Glucose-Capillary 122 (H) 70 - 99 mg/dL  ?CBC with Differential/Platelet     Status: Abnormal  ? Collection Time: 12/02/21  5:39 AM  ?Result Value Ref Range  ? WBC 4.0 4.0 - 10.5 K/uL  ? RBC 3.09 (L) 4.22 - 5.81 MIL/uL  ? Hemoglobin 9.1 (L) 13.0 - 17.0 g/dL  ? HCT 28.9 (L) 39.0 - 52.0 %  ? MCV  93.5 80.0 - 100.0 fL  ? MCH 29.4 26.0 - 34.0 pg  ? MCHC 31.5 30.0 - 36.0 g/dL  ? RDW 14.8 11.5 - 15.5 %  ? Platelets 126 (L) 150 - 400 K/uL  ? nRBC 0.0 0.0 - 0.2 %  ? Neutrophils Relative % 72 %  ? Neutro Abs 2.9 1.7 - 7.7 K/uL  ? Lymphocytes Relative 15 %  ? Lymphs Abs 0.6 (L) 0.7 - 4.0 K/uL  ? Monocytes Relative 12 %  ? Monocytes Absolute 0.5 0.1 - 1.0 K/uL  ? Eosinophils Relative 1 %  ? Eosinophils Absolute 0.1 0.0 - 0.5 K/uL  ? Basophils Relative 0 %  ? Basophils Absolute 0.0 0.0 - 0.1 K/uL  ? Immature Granulocytes 0 %  ? Abs Immature Granulocytes 0.01 0.00 - 0.07 K/uL  ?Magnesium     Status: None  ? Collection Time: 12/02/21  5:39 AM  ?Result Value Ref Range  ? Magnesium 2.1 1.7 - 2.4 mg/dL  ?Renal function panel     Status: Abnormal  ? Collection Time: 12/02/21  5:39 AM  ?Result Value Ref Range  ? Sodium 135 135 - 145 mmol/L  ? Potassium 3.9 3.5 - 5.1 mmol/L  ?  Chloride 98 98 - 111 mmol/L  ? CO2 29 22 - 32 mmol/L  ? Glucose, Bld 105 (H) 70 - 99 mg/dL  ? BUN 32 (H) 8 - 23 mg/dL  ? Creatinine, Ser 3.58 (H) 0.61 - 1.24 mg/dL  ? Calcium 8.2 (L) 8.9 - 10.3 mg/dL  ? Phosphorus 2.9 2.5 - 4.6 mg/dL  ? Albumin 2.7 (L) 3.5 - 5.0 g/dL  ? GFR, Estimated 18 (L) >60 mL/min  ? Anion gap 8 5 - 15  ? ? ?No results found. ? ?Assessment/Plan: ?5 Days Post-Op  ? ?Principal Problem: ?  Closed fracture of lateral portion of right tibial plateau ?Active Problems: ?  ESRD on dialysis [facility]) ?  Primary hypertension ?  Chronic systolic CHF (congestive heart failure) ([place]) ?  DMII (diabetes mellitus, type 2) ([place]) ?  Prostate cancer ([place]) ?  Left renal mass ? ?Continue physical and Occupational Therapy.  He may continue working on knee range of motion out of the knee immobilizer with PT but will remain nonweightbearing for at least 8 weeks postop.  Patient will remain on aspirin 81 mg p.o. twice daily for a total of 6 weeks postop.  He may be discharged to a skilled nursing facility from orthopedic standpoint today.  Patient will  follow-up in my office in 10 to 14 days.  [facility] in [place] should be contacted for an appointment at [phone]. ? ? ? ?Thornton Park , MD ?12/02/2021, 7:05 AM ? ? ? ? ?  ?

## 2021-12-02 NOTE — Discharge Summary (Signed)
?Physician Discharge Summary ?  ?Patient: Micheal Aguirre MRN: [id number] DOB: 26-Nov-1955  ?Admit date:     11/26/2021  ?Discharge date: 12/02/21  ?Discharge Physician: Dwyane Dee  ? ?PCP: Pcp, No  ? ?Recommendations at discharge:  ? ?Follow-up with orthopedic surgery within 2 weeks ? ?Discharge Diagnoses: ?Principal Problem: ?  Closed fracture of lateral portion of right tibial plateau ?Active Problems: ?  ESRD on dialysis [facility]) ?  Prostate cancer ([place]) ?  Primary hypertension ?  Chronic systolic CHF (congestive heart failure) ([place]) ?  DMII (diabetes mellitus, type 2) ([place]) ?  Left renal mass ? ?Resolved Problems: ?  * No resolved hospital problems. * ? ?Hospital Course: ?Micheal Aguirre is a 66 y.o. male with medical history significant for ESRD on HD MWF, essential hypertension, type 2 diabetes, hyperlipidemia, coronary artery disease status post CABG, HFrEF 35%, duodenal ulcers, history of GI bleed, prostate cancer, with metastasis to bone, left renal mass, who presented to [facility] ED via EMS after a mechanical fall at the hemodialysis center.  Immediately after his fall he incurred right knee pain with significant edema to his right knee. ?  ?Right knee x-ray revealed right lateral tibial plateau fracture and joint effusion.  CT scan confirmed the same.  He was evaluated by orthopedic surgery and underwent ORIF of the right lateral tibial plateau fracture on 11/27/2021.  He had recommendations to remain nonweightbearing on his right lower extremity. ? ?Assessment and Plan: ?* Closed fracture of lateral portion of right tibial plateau ?- S/p mechanical fall after dialysis ?- Underwent ORIF of right lateral tibial plateau fracture on 11/27/2021 ?-Remains nonweightbearing to right lower extremity per orthopedic recommendations ?- going to [facility] in [place] at discharge ?-Continue pain control ?-Aspirin 81 mg twice daily at discharge for DVT prophylaxis for total of 6 weeks per Ortho recommendations ?- Outpatient follow-up  with orthopedic surgery within 2 weeks after discharge ? ?ESRD on dialysis [facility] ?- Nephrology following, continue dialysis per plan ? ?Prostate cancer [facility]) ?- follows with [facility] ?- continue outpatient followup ?-Last office visit note reviewed from 11/17/2021.  He is on indefinite androgen deprivation therapy with Eligard; next dose due around 12/15/2021 ? ?Left renal mass ?- Followed closely by oncology outpatient as well ? ?DMII (diabetes mellitus, type 2) ([facility]) ?- A1c 5.6% on 11/27/2021 ?- Okay to continue diet control ? ?Chronic systolic CHF (congestive heart failure) ([facility]) ?- EF 35% ?- no s/s exacerbation ?- continue HD for volume control  ? ?Primary hypertension ?- Continue amlodipine, Coreg, torsemide ? ? ? ? ?  ? ? ?Consultants: Orthopedic surgery ?Procedures performed: ORIF right lateral tibial plateau fracture, 11/27/2021  ?Disposition: Skilled nursing facility ?Diet recommendation:  ?Discharge Diet Orders (From admission, onward)  ? ?  Start     Ordered  ? 12/02/21 0000  Diet - low sodium heart healthy       ? 12/02/21 1259  ? 12/02/21 0000  Diet Carb Modified       ? 12/02/21 1259  ? ?  ?  ? ?  ? ?Cardiac and Carb modified diet ?DISCHARGE MEDICATION: ?Allergies as of 12/02/2021   ?Not on File ?  ? ?  ?Medication List  ?  ? ?STOP taking these medications   ? ?insulin glargine 100 UNIT/ML injection ?Commonly known as: LANTUS ?  ?insulin lispro 100 UNIT/ML injection ?Commonly known as: HUMALOG ?  ? ?  ? ?TAKE these medications   ? ?acetaminophen 500 MG tablet ?Commonly known as: TYLENOL ?Take 1,000  mg by mouth every 8 (eight) hours as needed for mild pain or moderate pain. ?  ?amLODipine 5 MG tablet ?Commonly known as: NORVASC ?Take 5 mg by mouth daily. ?  ?aspirin 81 MG EC tablet ?Take 1 tablet (81 mg total) by mouth 2 (two) times daily. Swallow whole. ?  ?atorvastatin 80 MG tablet ?Commonly known as: LIPITOR ?Take 80 mg by mouth daily. ?  ?bisacodyl 5 MG EC tablet ?Commonly known as: DULCOLAX ?Take 10 mg by  mouth daily. ?  ?calcium carbonate 500 MG chewable tablet ?Commonly known as: TUMS - dosed in mg elemental calcium ?Chew 500 mg by mouth 2 (two) times daily. ?  ?carvedilol 25 MG tablet ?Commonly known as: COREG ?Take 25 mg by mouth 2 (two) times daily with a meal. ?  ?folic acid 1 MG tablet ?Commonly known as: FOLVITE ?Take 1 mg by mouth daily. ?  ?gabapentin 100 MG capsule ?Commonly known as: NEURONTIN ?Take 100 mg by mouth at bedtime. ?  ?melatonin 3 MG Tabs tablet ?Take 6 mg by mouth at bedtime. ?  ?methocarbamol 500 MG tablet ?Commonly known as: ROBAXIN ?Take 1 tablet (500 mg total) by mouth every 6 (six) hours as needed for muscle spasms. ?  ?multivitamin with minerals tablet ?Take 1 tablet by mouth every evening. ?  ?oxybutynin 5 MG tablet ?Commonly known as: DITROPAN ?Take 5 mg by mouth every 8 (eight) hours as needed for bladder spasms. ?  ?oxyCODONE 5 MG immediate release tablet ?Commonly known as: Oxy IR/ROXICODONE ?Take 1 tablet (5 mg total) by mouth every 6 (six) hours as needed for moderate pain, breakthrough pain or severe pain. ?  ?pantoprazole 40 MG tablet ?Commonly known as: PROTONIX ?Take 40 mg by mouth every 12 (twelve) hours. ?  ?polyethylene glycol 17 g packet ?Commonly known as: MIRALAX / GLYCOLAX ?Take 17 g by mouth at bedtime. ?  ?predniSONE 10 MG tablet ?Commonly known as: DELTASONE ?Take 10 mg by mouth daily with breakfast. ?  ?senna-docusate 8.6-50 MG tablet ?Commonly known as: Senokot-S ?Take 2 tablets by mouth 2 (two) times daily. ?  ?simethicone 80 MG chewable tablet ?Commonly known as: MYLICON ?Chew 80 mg by mouth every 6 (six) hours. ?  ?tamsulosin 0.4 MG Caps capsule ?Commonly known as: FLOMAX ?Take 0.4 mg by mouth at bedtime. ?  ?torsemide 20 MG tablet ?Commonly known as: DEMADEX ?Take 20 mg by mouth 2 (two) times daily. ?  ?vitamin B-12 500 MCG tablet ?Commonly known as: CYANOCOBALAMIN ?Take 500 mcg by mouth in the morning. ?  ?Vitamin D (Ergocalciferol) 1.25 MG (50000 UNIT) Caps  capsule ?Commonly known as: DRISDOL ?Take 50,000 Units by mouth every Saturday. ?  ?zinc oxide 20 % ointment ?Apply 1 application. topically every 8 (eight) hours as needed (peri-skin care). ?  ? ?  ? ?  ?  ? ? ?  ?Discharge Care Instructions  ?(From admission, onward)  ?  ? ? ?  ? ?  Start     Ordered  ? 12/02/21 0000  Discharge wound care:       ?Comments: Keep immobilizer in place and follow up with orthopedic surgery within 2 weeks  ? 12/02/21 1259  ? ?  ?  ? ?  ? ? Contact information for follow-up providers   ? ? Thornton Park, MD. Schedule an appointment as soon as possible for a visit in 2 week(s).   ?Specialty: Orthopedic Surgery ?Contact information: ?[place] [address] ?[phone] ? ? ?  ?  ? ?  ?  ? ?  Contact information for after-discharge care   ? ? Destination   ? ? [facility] .   ?Service: Skilled Nursing ?Contact information: ?[place] ?[place] ?[phone] ? ?  ?  ? ?  ?  ? ?  ?  ? ?  ? ?Discharge Exam: ?Filed Weights  ? 11/30/21 0920 12/01/21 0945 12/01/21 1300  ?Weight: 84.8 kg 83.4 kg 82.7 kg  ? ?Physical Exam ?Constitutional:   ?   General: He is not in acute distress. ?   Appearance: Normal appearance.  ?HENT:  ?   Head: Normocephalic and atraumatic.  ?   Mouth/Throat:  ?   Mouth: Mucous membranes are moist.  ?Eyes:  ?   Extraocular Movements: Extraocular movements intact.  ?Cardiovascular:  ?   Rate and Rhythm: Normal rate and regular rhythm.  ?   Heart sounds: Normal heart sounds.  ?Pulmonary:  ?   Effort: Pulmonary effort is normal. No respiratory distress.  ?   Breath sounds: Normal breath sounds. No wheezing.  ?Abdominal:  ?   General: Bowel sounds are normal. There is no distension.  ?   Palpations: Abdomen is soft.  ?   Tenderness: There is no abdominal tenderness.  ?Musculoskeletal:  ?   Cervical back: Normal range of motion and neck supple.  ?   Comments: Right knee immobilizer in place.  1-2+  pitting edema appreciated in right foot  ?Skin: ?   General: Skin is warm and dry.  ?Neurological:  ?   General: No focal deficit present.  ?   Mental Status: He is alert.  ?Psychiatric:     ?   Mood and A

## 2021-12-02 NOTE — Plan of Care (Signed)

## 2021-12-02 NOTE — TOC Progression Note (Signed)
Transition of Care (TOC) - Progression Note  ? ? ?Patient Details  ?Name: Micheal Aguirre ?MRN: [id number] ?Date of Birth: [birth year] ? ?Transition of Care (TOC) CM/SW Contact  ?Conception Oms, RN ?Phone Number: ?12/02/2021, 10:59 AM ? ?Clinical Narrative:   Ins approved [id number] 4/13- 4/17 ? ? ? ?Expected Discharge Plan: [place] ?Barriers to Discharge: SNF Pending bed offer ? ?Expected Discharge Plan and Services ?Expected Discharge Plan: [place] ?  ?  ?  ?  ?                ?  ?  ?  ?  ?  ?  ?  ?  ?  ?  ? ? ?Social Determinants of Health (SDOH) Interventions ?  ? ?Readmission Risk Interventions ?   ? View : No data to display.  ?  ?  ?  ? ? ?

## 2021-12-02 NOTE — Care Management Important Message (Signed)
Important Message ? ?Patient Details  ?Name: Micheal Aguirre ?MRN: [id number] ?Date of Birth: Sep 12, 1955 ? ? ?Medicare Important Message Given:  N/A - LOS <3 / Initial given by admissions ? ? ? ? ?Juliann Pulse A [name] ?12/02/2021, 9:36 AM ?

## 2021-12-02 NOTE — TOC Progression Note (Signed)
Transition of Care (TOC) - Progression Note  ? ? ?Patient Details  ?Name: Micheal Aguirre ?MRN: [id number] ?Date of Birth: Nov 14, 1955 ? ?Transition of Care (TOC) CM/SW Contact  ?Conception Oms, RN ?Phone Number: ?12/02/2021, 1:11 PM ? ?Clinical Narrative:   EMS scheduled to pick up the patient at 3 PM, going to the [facility] hall [facility] [place], Daughter aware ? ? ? ?Expected Discharge Plan: [place] ?Barriers to Discharge: SNF Pending bed offer ? ?Expected Discharge Plan and Services ?Expected Discharge Plan: [place] ?  ?  ?  ?  ?Expected Discharge Date: 12/02/21               ?  ?  ?  ?  ?  ?  ?  ?  ?  ?  ? ? ?Social Determinants of Health (SDOH) Interventions ?  ? ?Readmission Risk Interventions ?   ? View : No data to display.  ?  ?  ?  ? ? ?

## 2021-12-02 NOTE — Progress Notes (Signed)
?[facility]  ?ROUNDING NOTE  ? ?Subjective:  ?Patient underwent hemodialysis treatment yesterday. ?Tolerated well. ?Resting comfortably at the moment. ? ? ?Objective:  ?Vital signs in last 24 hours:  ?Temp:  [98.3 ?F (36.8 ?C)-100 ?F (37.8 ?C)] 98.7 ?F (37.1 ?C) (04/13 1144) ?Pulse Rate:  [42-102] 70 (04/13 1144) ?Resp:  [12-18] 15 (04/13 1144) ?BP: (111-136)/(75-93) 123/75 (04/13 1144) ?SpO2:  [93 %-100 %] 98 % (04/13 1144) ?Weight:  [82.7 kg] 82.7 kg (04/12 1300) ? ?Weight change: -1.4 kg ?Filed Weights  ? 11/30/21 0920 12/01/21 0945 12/01/21 1300  ?Weight: 84.8 kg 83.4 kg 82.7 kg  ? ? ?Intake/Output: ?I/O last 3 completed shifts: ?In: 360 [P.O.:360] ?Out: 1405 [Urine:375; Other:1030] ?  ?Intake/Output this shift: ? Total I/O ?In: 240 [P.O.:240] ?Out: -  ? ?Physical Exam: ?General: No acute distress  ?Head: Normocephalic, atraumatic. Moist oral mucosal membranes  ?Eyes: Anicteric  ?Neck: Supple  ?Lungs:  Clear to auscultation, normal effort  ?Heart: S1S2 no rubs  ?Abdomen:  Soft, nontender, bowel sounds present  ?Extremities: Trace peripheral edema.  ?Neurologic: Awake, alert, following commands  ?Skin: No acute rash  ?Access: Right IJ PermCath  ? ? ?Basic Metabolic Panel: ?Recent Labs  ?Lab 11/27/21 ?0559 11/27/21 ?1354 11/28/21 ?0428 11/29/21 ?4944 11/30/21 ?9675 12/01/21 ?9163 12/02/21 ?8466  ?NA 138   < > 139 138 136 135 135  ?K 2.8*   < > 3.9 3.8 4.1 3.4* 3.9  ?CL 102   < > 105 102 98 97* 98  ?CO2 26   < > '25 27 26 28 29  '$ ?GLUCOSE 113*   < > 128* 116* 114* 129* 105*  ?BUN 26*   < > 40* 31* 47* 39* 32*  ?CREATININE 3.91*   < > 4.94* 4.10* 5.48* 4.59* 3.58*  ?CALCIUM 8.2*   < > 7.7* 7.7* 8.1* 7.9* 8.2*  ?MG 1.9  --  1.8 2.1 2.3 2.2 2.1  ?PHOS 4.0  --  4.5  --   --   --  2.9  ? < > = values in this interval not displayed.  ? ? ?Liver Function Tests: ?Recent Labs  ?Lab 11/26/21 ?1807 11/27/21 ?0559 12/02/21 ?5993  ?AST 22 20  --   ?ALT 19 18  --   ?ALKPHOS 121 116  --   ?BILITOT 0.5 0.6  --   ?PROT  6.7 7.0  --   ?ALBUMIN 3.4* 3.4* 2.7*  ? ?No results for input(s): LIPASE, AMYLASE in the last 168 hours. ?No results for input(s): AMMONIA in the last 168 hours. ? ?CBC: ?Recent Labs  ?Lab 11/26/21 ?1807 11/27/21 ?0559 11/28/21 ?5701 11/29/21 ?7793 11/30/21 ?9030 12/01/21 ?0923 12/02/21 ?3007  ?WBC 6.4   < > 7.7 6.9 5.0 3.4* 4.0  ?NEUTROABS 5.0  --   --   --   --  2.3 2.9  ?HGB 12.2*   < > 10.6* 9.3* 9.6* 8.7* 9.1*  ?HCT 38.9*   < > 34.5* 30.0* 31.1* 27.8* 28.9*  ?MCV 93.1   < > 94.5 92.6 94.0 91.4 93.5  ?PLT 128*   < > 98* 87* 99* 104* 126*  ? < > = values in this interval not displayed.  ? ? ?Cardiac Enzymes: ?No results for input(s): CKTOTAL, CKMB, CKMBINDEX, TROPONINI in the last 168 hours. ? ?BNP: ?Invalid input(s): POCBNP ? ?CBG: ?Recent Labs  ?Lab 12/01/21 ?6226 12/01/21 ?1616 12/01/21 ?2338 12/02/21 ?0825 12/02/21 ?1147  ?GLUCAP 132* 172* 122* 125* 110*  ? ? ?Microbiology: ?Results for orders placed  or performed during the hospital encounter of 11/26/21  ?Resp Panel by RT-PCR (Flu A&B, Covid) Nasopharyngeal Swab     Status: None  ? Collection Time: 11/26/21  6:07 PM  ? Specimen: Nasopharyngeal Swab; Nasopharyngeal(NP) swabs in vial transport medium  ?Result Value Ref Range Status  ? SARS Coronavirus 2 by RT PCR NEGATIVE NEGATIVE Final  ?  Comment: (NOTE) ?SARS-CoV-2 target nucleic acids are NOT DETECTED. ? ?The SARS-CoV-2 RNA is generally detectable in upper respiratory ?specimens during the acute phase of infection. The lowest ?concentration of SARS-CoV-2 viral copies this assay can detect is ?138 copies/mL. A negative result does not preclude SARS-Cov-2 ?infection and should not be used as the sole basis for treatment or ?other patient management decisions. A negative result may occur with  ?improper specimen collection/handling, submission of specimen other ?than nasopharyngeal swab, presence of viral mutation(s) within the ?areas targeted by this assay, and inadequate number of viral ?copies(<138  copies/mL). A negative result must be combined with ?clinical observations, patient history, and epidemiological ?information. The expected result is Negative. ? ?Fact Sheet for Patients:  ?EntrepreneurPulse.com.au ? ?Fact Sheet for Healthcare Providers:  ?IncredibleEmployment.be ? ?This test is no t yet approved or cleared by the [place] FDA and  ?has been authorized for detection and/or diagnosis of SARS-CoV-2 by ?FDA under an Emergency Use Authorization (EUA). This EUA will remain  ?in effect (meaning this test can be used) for the duration of the ?COVID-19 declaration under Section 564(b)(1) of the Act, 21 ?U.S.C.section 360bbb-3(b)(1), unless the authorization is terminated  ?or revoked sooner.  ? ? ?  ? Influenza A by PCR NEGATIVE NEGATIVE Final  ? Influenza B by PCR NEGATIVE NEGATIVE Final  ?  Comment: (NOTE) ?The Xpert Xpress SARS-CoV-2/FLU/RSV plus assay is intended as an aid ?in the diagnosis of influenza from Nasopharyngeal swab specimens and ?should not be used as a sole basis for treatment. Nasal washings and ?aspirates are unacceptable for Xpert Xpress SARS-CoV-2/FLU/RSV ?testing. ? ?Fact Sheet for Patients: ?EntrepreneurPulse.com.au ? ?Fact Sheet for Healthcare Providers: ?IncredibleEmployment.be ? ?This test is not yet approved or cleared by the [place] FDA and ?has been authorized for detection and/or diagnosis of SARS-CoV-2 by ?FDA under an Emergency Use Authorization (EUA). This EUA will remain ?in effect (meaning this test can be used) for the duration of the ?COVID-19 declaration under Section 564(b)(1) of the Act, 21 U.S.C. ?section 360bbb-3(b)(1), unless the authorization is terminated or ?revoked. ? ?Performed at [facility], [place], ?[place] [address] ?  ?Surgical PCR screen     Status: None  ? Collection Time: 11/27/21  6:49 AM  ? Specimen: Nasal Mucosa; Nasal Swab  ?Result Value  Ref Range Status  ? MRSA, PCR NEGATIVE NEGATIVE Final  ? Staphylococcus aureus NEGATIVE NEGATIVE Final  ?  Comment: (NOTE) ?The Xpert SA Assay (FDA approved for NASAL specimens in patients 8 ?years of age and older), is one component of a comprehensive ?surveillance program. It is not intended to diagnose infection nor to ?guide or monitor treatment. ?Performed at [facility], [place], ?[place] [address] ?  ? ? ?Coagulation Studies: ?No results for input(s): LABPROT, INR in the last 72 hours. ? ?Urinalysis: ?No results for input(s): COLORURINE, LABSPEC, [place], GLUCOSEU, HGBUR, BILIRUBINUR, KETONESUR, PROTEINUR, UROBILINOGEN, NITRITE, LEUKOCYTESUR in the last 72 hours. ? ?Invalid input(s): APPERANCEUR  ? ? ?Imaging: ?No results found. ? ? ?Medications:  ? ? methocarbamol (ROBAXIN) IV    ? ? amLODipine  5 mg Oral Daily  ? aspirin EC  81 mg Oral BID  ? atorvastatin  80 mg Oral Daily  ? calcium carbonate  500 mg Oral BID  ? carvedilol  25 mg Oral BID WC  ? Chlorhexidine Gluconate Cloth  6 each Topical Daily  ? Chlorhexidine Gluconate Cloth  6 each Topical Q0600  ? docusate sodium  100 mg Oral BID  ? folic acid  1 mg Oral Daily  ? gabapentin  100 mg Oral Daily  ? insulin aspart  0-5 Units Subcutaneous QHS  ? insulin aspart  0-6 Units Subcutaneous TID WC  ? lactulose  30 g Oral Once  ? multivitamin with minerals  1 tablet Oral QPM  ? oxybutynin  5 mg Oral TID  ? pantoprazole  40 mg Oral Q12H  ? polyethylene glycol  17 g Oral Daily  ? predniSONE  10 mg Oral Q breakfast  ? senna-docusate  1 tablet Oral BID  ? tamsulosin  0.4 mg Oral QHS  ? torsemide  20 mg Oral BID  ? traZODone  50 mg Oral QHS  ? vitamin B-12  500 mcg Oral q AM  ? Vitamin D (Ergocalciferol)  50,000 Units Oral Q Sat  ? ?acetaminophen, alum & mag hydroxide-simeth, bisacodyl, HYDROmorphone (DILAUDID) injection, melatonin, menthol-cetylpyridinium **OR** phenol, methocarbamol **OR** methocarbamol (ROBAXIN) IV, ondansetron **OR**  ondansetron (ZOFRAN) IV, oxyCODONE, prochlorperazine, traMADol ? ?Assessment/ Plan:  ?66 y.o. male with past medical history of ESRD on HD MWF, hypertension, diabetes mellitus type chronic systolic heart failure

## 2021-12-02 NOTE — TOC Progression Note (Signed)
Transition of Care (TOC) - Progression Note  ? ? ?Patient Details  ?Name: Micheal Aguirre ?MRN: [id number] ?Date of Birth: [birth year] ? ?Transition of Care (TOC) CM/SW Contact  ?Conception Oms, RN ?Phone Number: ?12/02/2021, 10:17 AM ? ?Clinical Narrative:   Ins pending ? ? ? ?Expected Discharge Plan: [place] ?Barriers to Discharge: SNF Pending bed offer ? ?Expected Discharge Plan and Services ?Expected Discharge Plan: [place] ?  ?  ?  ?  ?                ?  ?  ?  ?  ?  ?  ?  ?  ?  ?  ? ? ?Social Determinants of Health (SDOH) Interventions ?  ? ?Readmission Risk Interventions ?   ? View : No data to display.  ?  ?  ?  ? ? ?

## 2021-12-10 ENCOUNTER — Encounter: Payer: Self-pay | Admitting: Orthopedic Surgery

## 2022-01-25 ENCOUNTER — Other Ambulatory Visit: Payer: Self-pay | Admitting: Orthopedic Surgery

## 2022-01-25 DIAGNOSIS — S82101A Unspecified fracture of upper end of right tibia, initial encounter for closed fracture: Secondary | ICD-10-CM

## 2022-01-26 ENCOUNTER — Ambulatory Visit
Admission: RE | Admit: 2022-01-26 | Discharge: 2022-01-26 | Disposition: A | Discharge status: Home / Self Care | Payer: Medicare PPO | Source: Ambulatory Visit | Attending: Orthopedic Surgery | Admitting: Orthopedic Surgery

## 2022-01-26 DIAGNOSIS — S82101A Unspecified fracture of upper end of right tibia, initial encounter for closed fracture: Secondary | ICD-10-CM | POA: Diagnosis present

## 2022-02-03 ENCOUNTER — Inpatient Hospital Stay
Admission: EM | Admit: 2022-02-03 | Discharge: 2022-02-07 | DRG: 480 | Disposition: A | Discharge status: Skilled Nursing Facility | Payer: Medicare PPO | Attending: Internal Medicine | Admitting: Internal Medicine

## 2022-02-03 ENCOUNTER — Inpatient Hospital Stay: Payer: Medicare PPO | Admitting: Certified Registered"

## 2022-02-03 ENCOUNTER — Other Ambulatory Visit: Payer: Self-pay

## 2022-02-03 ENCOUNTER — Emergency Department: Payer: Medicare PPO

## 2022-02-03 ENCOUNTER — Encounter: Payer: Self-pay | Admitting: Emergency Medicine

## 2022-02-03 ENCOUNTER — Inpatient Hospital Stay: Payer: Medicare PPO

## 2022-02-03 ENCOUNTER — Encounter
Admission: EM | Disposition: A | Discharge status: Skilled Nursing Facility | Payer: Self-pay | Source: Home / Self Care | Attending: Internal Medicine

## 2022-02-03 DIAGNOSIS — D62 Acute posthemorrhagic anemia: Secondary | ICD-10-CM | POA: Diagnosis not present

## 2022-02-03 DIAGNOSIS — S72401A Unspecified fracture of lower end of right femur, initial encounter for closed fracture: Secondary | ICD-10-CM | POA: Diagnosis present

## 2022-02-03 DIAGNOSIS — I482 Chronic atrial fibrillation, unspecified: Secondary | ICD-10-CM | POA: Diagnosis present

## 2022-02-03 DIAGNOSIS — I10 Essential (primary) hypertension: Secondary | ICD-10-CM | POA: Diagnosis not present

## 2022-02-03 DIAGNOSIS — E785 Hyperlipidemia, unspecified: Secondary | ICD-10-CM | POA: Diagnosis present

## 2022-02-03 DIAGNOSIS — E876 Hypokalemia: Secondary | ICD-10-CM | POA: Diagnosis present

## 2022-02-03 DIAGNOSIS — W19XXXA Unspecified fall, initial encounter: Secondary | ICD-10-CM | POA: Diagnosis not present

## 2022-02-03 DIAGNOSIS — Z87891 Personal history of nicotine dependence: Secondary | ICD-10-CM

## 2022-02-03 DIAGNOSIS — N2581 Secondary hyperparathyroidism of renal origin: Secondary | ICD-10-CM | POA: Diagnosis present

## 2022-02-03 DIAGNOSIS — W050XXA Fall from non-moving wheelchair, initial encounter: Secondary | ICD-10-CM | POA: Diagnosis present

## 2022-02-03 DIAGNOSIS — Z888 Allergy status to other drugs, medicaments and biological substances status: Secondary | ICD-10-CM

## 2022-02-03 DIAGNOSIS — D631 Anemia in chronic kidney disease: Secondary | ICD-10-CM | POA: Diagnosis present

## 2022-02-03 DIAGNOSIS — N186 End stage renal disease: Secondary | ICD-10-CM | POA: Diagnosis present

## 2022-02-03 DIAGNOSIS — N2889 Other specified disorders of kidney and ureter: Secondary | ICD-10-CM | POA: Diagnosis present

## 2022-02-03 DIAGNOSIS — S82141D Displaced bicondylar fracture of right tibia, subsequent encounter for closed fracture with routine healing: Secondary | ICD-10-CM

## 2022-02-03 DIAGNOSIS — Z8249 Family history of ischemic heart disease and other diseases of the circulatory system: Secondary | ICD-10-CM

## 2022-02-03 DIAGNOSIS — S72002A Fracture of unspecified part of neck of left femur, initial encounter for closed fracture: Secondary | ICD-10-CM | POA: Diagnosis present

## 2022-02-03 DIAGNOSIS — E1122 Type 2 diabetes mellitus with diabetic chronic kidney disease: Secondary | ICD-10-CM | POA: Diagnosis present

## 2022-02-03 DIAGNOSIS — Z7982 Long term (current) use of aspirin: Secondary | ICD-10-CM

## 2022-02-03 DIAGNOSIS — S72451A Displaced supracondylar fracture without intracondylar extension of lower end of right femur, initial encounter for closed fracture: Secondary | ICD-10-CM | POA: Diagnosis present

## 2022-02-03 DIAGNOSIS — Z992 Dependence on renal dialysis: Secondary | ICD-10-CM | POA: Diagnosis not present

## 2022-02-03 DIAGNOSIS — Z79890 Hormone replacement therapy (postmenopausal): Secondary | ICD-10-CM | POA: Diagnosis not present

## 2022-02-03 DIAGNOSIS — Y92009 Unspecified place in unspecified non-institutional (private) residence as the place of occurrence of the external cause: Secondary | ICD-10-CM | POA: Diagnosis not present

## 2022-02-03 DIAGNOSIS — E7800 Pure hypercholesterolemia, unspecified: Secondary | ICD-10-CM | POA: Diagnosis present

## 2022-02-03 DIAGNOSIS — I132 Hypertensive heart and chronic kidney disease with heart failure and with stage 5 chronic kidney disease, or end stage renal disease: Secondary | ICD-10-CM | POA: Diagnosis present

## 2022-02-03 DIAGNOSIS — Z79899 Other long term (current) drug therapy: Secondary | ICD-10-CM

## 2022-02-03 DIAGNOSIS — I5032 Chronic diastolic (congestive) heart failure: Secondary | ICD-10-CM | POA: Diagnosis present

## 2022-02-03 DIAGNOSIS — Z794 Long term (current) use of insulin: Secondary | ICD-10-CM | POA: Diagnosis not present

## 2022-02-03 DIAGNOSIS — E1129 Type 2 diabetes mellitus with other diabetic kidney complication: Secondary | ICD-10-CM | POA: Diagnosis present

## 2022-02-03 HISTORY — PX: ORIF FEMUR FRACTURE: SHX2119

## 2022-02-03 LAB — CBC WITH DIFFERENTIAL/PLATELET
Abs Immature Granulocytes: 0.01 10*3/uL (ref 0.00–0.07)
Basophils Absolute: 0 10*3/uL (ref 0.0–0.1)
Basophils Relative: 0 %
Eosinophils Absolute: 0 10*3/uL (ref 0.0–0.5)
Eosinophils Relative: 1 %
HCT: 39.4 % (ref 39.0–52.0)
Hemoglobin: 12.2 g/dL — ABNORMAL LOW (ref 13.0–17.0)
Immature Granulocytes: 0 %
Lymphocytes Relative: 11 %
Lymphs Abs: 0.6 10*3/uL — ABNORMAL LOW (ref 0.7–4.0)
MCH: 31.3 pg (ref 26.0–34.0)
MCHC: 31 g/dL (ref 30.0–36.0)
MCV: 101 fL — ABNORMAL HIGH (ref 80.0–100.0)
Monocytes Absolute: 0.2 10*3/uL (ref 0.1–1.0)
Monocytes Relative: 4 %
Neutro Abs: 4.8 10*3/uL (ref 1.7–7.7)
Neutrophils Relative %: 84 %
Platelets: 125 10*3/uL — ABNORMAL LOW (ref 150–400)
RBC: 3.9 MIL/uL — ABNORMAL LOW (ref 4.22–5.81)
RDW: 17 % — ABNORMAL HIGH (ref 11.5–15.5)
WBC: 5.7 10*3/uL (ref 4.0–10.5)
nRBC: 0 % (ref 0.0–0.2)

## 2022-02-03 LAB — GLUCOSE, CAPILLARY
Glucose-Capillary: 101 mg/dL — ABNORMAL HIGH (ref 70–99)
Glucose-Capillary: 121 mg/dL — ABNORMAL HIGH (ref 70–99)
Glucose-Capillary: 155 mg/dL — ABNORMAL HIGH (ref 70–99)

## 2022-02-03 LAB — PROTIME-INR
INR: 1.2 (ref 0.8–1.2)
Prothrombin Time: 15.2 seconds (ref 11.4–15.2)

## 2022-02-03 LAB — COMPREHENSIVE METABOLIC PANEL
ALT: 28 U/L (ref 0–44)
AST: 25 U/L (ref 15–41)
Albumin: 3.7 g/dL (ref 3.5–5.0)
Alkaline Phosphatase: 117 U/L (ref 38–126)
Anion gap: 8 (ref 5–15)
BUN: 37 mg/dL — ABNORMAL HIGH (ref 8–23)
CO2: 29 mmol/L (ref 22–32)
Calcium: 8.4 mg/dL — ABNORMAL LOW (ref 8.9–10.3)
Chloride: 105 mmol/L (ref 98–111)
Creatinine, Ser: 3.71 mg/dL — ABNORMAL HIGH (ref 0.61–1.24)
GFR, Estimated: 17 mL/min — ABNORMAL LOW (ref >60)
Glucose, Bld: 106 mg/dL — ABNORMAL HIGH (ref 70–99)
Potassium: 3.1 mmol/L — ABNORMAL LOW (ref 3.5–5.1)
Sodium: 142 mmol/L (ref 135–145)
Total Bilirubin: 0.7 mg/dL (ref 0.3–1.2)
Total Protein: 6.9 g/dL (ref 6.5–8.1)

## 2022-02-03 LAB — MAGNESIUM: Magnesium: 2.1 mg/dL (ref 1.7–2.4)

## 2022-02-03 LAB — HEPATITIS B SURFACE ANTIGEN: Hepatitis B Surface Ag: NONREACTIVE

## 2022-02-03 LAB — TYPE AND SCREEN
ABO/RH(D): O POS
Antibody Screen: NEGATIVE

## 2022-02-03 LAB — APTT: aPTT: 30 seconds (ref 24–36)

## 2022-02-03 SURGERY — OPEN REDUCTION INTERNAL FIXATION (ORIF) DISTAL FEMUR FRACTURE
Anesthesia: General | Laterality: Right

## 2022-02-03 MED ORDER — BISACODYL 5 MG PO TBEC
10.0000 mg | DELAYED_RELEASE_TABLET | Freq: Every day | ORAL | Status: DC
  Administered 2022-02-04 – 2022-02-06 (×3): 10 mg via ORAL
  Filled 2022-02-03 (×3): qty 2

## 2022-02-03 MED ORDER — VITAMIN B-12 1000 MCG PO TABS
500.0000 ug | ORAL_TABLET | Freq: Every morning | ORAL | Status: DC
  Administered 2022-02-04 – 2022-02-05 (×2): 500 ug via ORAL
  Filled 2022-02-03 (×2): qty 1

## 2022-02-03 MED ORDER — DIPHENHYDRAMINE HCL 12.5 MG/5ML PO ELIX: 12.5000 mg | ORAL_SOLUTION | ORAL | Status: DC | PRN

## 2022-02-03 MED ORDER — TORSEMIDE 20 MG PO TABS
20.0000 mg | ORAL_TABLET | Freq: Two times a day (BID) | ORAL | Status: DC
  Administered 2022-02-04 – 2022-02-06 (×6): 20 mg via ORAL
  Filled 2022-02-03 (×6): qty 1

## 2022-02-03 MED ORDER — BISACODYL 10 MG RE SUPP
10.0000 mg | Freq: Every day | RECTAL | Status: DC | PRN
  Filled 2022-02-03: qty 1

## 2022-02-03 MED ORDER — SEVOFLURANE IN SOLN
RESPIRATORY_TRACT | Status: AC
  Filled 2022-02-03: qty 250

## 2022-02-03 MED ORDER — ADULT MULTIVITAMIN W/MINERALS CH: 1.0000 | ORAL_TABLET | Freq: Every evening | ORAL | Status: DC

## 2022-02-03 MED ORDER — CALCIUM CARBONATE ANTACID 500 MG PO CHEW
500.0000 mg | CHEWABLE_TABLET | Freq: Two times a day (BID) | ORAL | Status: DC
  Administered 2022-02-04 – 2022-02-06 (×6): 500 mg via ORAL
  Filled 2022-02-03 (×7): qty 3

## 2022-02-03 MED ORDER — PROPOFOL 10 MG/ML IV BOLUS
INTRAVENOUS | Status: DC | PRN
  Administered 2022-02-03: 50 mg via INTRAVENOUS
  Administered 2022-02-03: 100 mg via INTRAVENOUS
  Administered 2022-02-03: 50 mg via INTRAVENOUS

## 2022-02-03 MED ORDER — ACETAMINOPHEN 500 MG PO TABS
1000.0000 mg | ORAL_TABLET | Freq: Four times a day (QID) | ORAL | Status: AC
  Administered 2022-02-03 – 2022-02-04 (×3): 1000 mg via ORAL
  Filled 2022-02-03 (×3): qty 2

## 2022-02-03 MED ORDER — NOREPINEPHRINE BITARTRATE 1 MG/ML IV SOLN
INTRAVENOUS | Status: AC
Start: 2022-02-03 — End: ?
  Filled 2022-02-03: qty 4

## 2022-02-03 MED ORDER — HYDRALAZINE HCL 20 MG/ML IJ SOLN
5.0000 mg | INTRAMUSCULAR | Status: DC | PRN
  Administered 2022-02-03: 5 mg via INTRAVENOUS
  Filled 2022-02-03: qty 1

## 2022-02-03 MED ORDER — SODIUM CHLORIDE FLUSH 0.9 % IV SOLN
INTRAVENOUS | Status: AC
  Filled 2022-02-03: qty 10

## 2022-02-03 MED ORDER — ACETAMINOPHEN 325 MG PO TABS: 650.0000 mg | ORAL_TABLET | Freq: Four times a day (QID) | ORAL | Status: DC | PRN

## 2022-02-03 MED ORDER — POLYETHYLENE GLYCOL 3350 17 G PO PACK
17.0000 g | PACK | Freq: Every day | ORAL | Status: DC
  Administered 2022-02-04 – 2022-02-06 (×3): 17 g via ORAL
  Filled 2022-02-03 (×4): qty 1

## 2022-02-03 MED ORDER — INSULIN ASPART 100 UNIT/ML IJ SOLN
0.0000 [IU] | Freq: Three times a day (TID) | INTRAMUSCULAR | Status: DC
  Administered 2022-02-04: 1 [IU] via SUBCUTANEOUS
  Administered 2022-02-04: 2 [IU] via SUBCUTANEOUS
  Administered 2022-02-05: 1 [IU] via SUBCUTANEOUS
  Administered 2022-02-05 – 2022-02-06 (×2): 2 [IU] via SUBCUTANEOUS
  Administered 2022-02-06: 1 [IU] via SUBCUTANEOUS
  Filled 2022-02-03 (×6): qty 1

## 2022-02-03 MED ORDER — ACETAMINOPHEN 10 MG/ML IV SOLN: 1000.0000 mg | Freq: Once | INTRAVENOUS | Status: DC | PRN

## 2022-02-03 MED ORDER — PHENYLEPHRINE 80 MCG/ML (10ML) SYRINGE FOR IV PUSH (FOR BLOOD PRESSURE SUPPORT)
PREFILLED_SYRINGE | INTRAVENOUS | Status: AC
  Filled 2022-02-03: qty 10

## 2022-02-03 MED ORDER — ROCURONIUM BROMIDE 100 MG/10ML IV SOLN
INTRAVENOUS | Status: DC | PRN
  Administered 2022-02-03: 50 mg via INTRAVENOUS

## 2022-02-03 MED ORDER — MIDAZOLAM HCL 2 MG/2ML IJ SOLN
INTRAMUSCULAR | Status: DC | PRN
  Administered 2022-02-03 (×2): 2 mg via INTRAVENOUS

## 2022-02-03 MED ORDER — BUPIVACAINE HCL (PF) 0.5 % IJ SOLN
INTRAMUSCULAR | Status: AC
  Filled 2022-02-03: qty 10

## 2022-02-03 MED ORDER — METOCLOPRAMIDE HCL 5 MG/ML IJ SOLN: 5.0000 mg | Freq: Three times a day (TID) | INTRAMUSCULAR | Status: DC | PRN

## 2022-02-03 MED ORDER — LIDOCAINE HCL (PF) 2 % IJ SOLN
INTRAMUSCULAR | Status: AC
  Filled 2022-02-03: qty 5

## 2022-02-03 MED ORDER — SENNOSIDES-DOCUSATE SODIUM 8.6-50 MG PO TABS
1.0000 | ORAL_TABLET | Freq: Every evening | ORAL | Status: DC | PRN
  Administered 2022-02-05 – 2022-02-06 (×2): 1 via ORAL

## 2022-02-03 MED ORDER — ONDANSETRON HCL 4 MG PO TABS: 4.0000 mg | ORAL_TABLET | Freq: Four times a day (QID) | ORAL | Status: DC | PRN

## 2022-02-03 MED ORDER — 0.9 % SODIUM CHLORIDE (POUR BTL) OPTIME
TOPICAL | Status: DC | PRN
  Administered 2022-02-03: 1000 mL

## 2022-02-03 MED ORDER — OXYBUTYNIN CHLORIDE 5 MG PO TABS
5.0000 mg | ORAL_TABLET | Freq: Three times a day (TID) | ORAL | Status: DC | PRN
Start: 2022-02-03 — End: 2022-02-07

## 2022-02-03 MED ORDER — SUCCINYLCHOLINE CHLORIDE 200 MG/10ML IV SOSY
PREFILLED_SYRINGE | INTRAVENOUS | Status: DC | PRN
  Administered 2022-02-03: 100 mg via INTRAVENOUS

## 2022-02-03 MED ORDER — ACETAMINOPHEN 325 MG PO TABS
325.0000 mg | ORAL_TABLET | Freq: Four times a day (QID) | ORAL | Status: DC | PRN
  Administered 2022-02-06 (×3): 650 mg via ORAL
  Filled 2022-02-03 (×3): qty 2

## 2022-02-03 MED ORDER — MORPHINE SULFATE (PF) 4 MG/ML IV SOLN
4.0000 mg | Freq: Once | INTRAVENOUS | Status: AC
  Administered 2022-02-03: 4 mg via INTRAVENOUS
  Filled 2022-02-03: qty 1

## 2022-02-03 MED ORDER — ONDANSETRON HCL 4 MG/2ML IJ SOLN
4.0000 mg | Freq: Once | INTRAMUSCULAR | Status: AC
  Administered 2022-02-03: 4 mg via INTRAVENOUS
  Filled 2022-02-03: qty 2

## 2022-02-03 MED ORDER — MIDAZOLAM HCL 2 MG/2ML IJ SOLN
INTRAMUSCULAR | Status: AC
  Filled 2022-02-03: qty 2

## 2022-02-03 MED ORDER — BUPIVACAINE HCL (PF) 0.5 % IJ SOLN
INTRAMUSCULAR | Status: DC | PRN
  Administered 2022-02-03: 50 mg via PERINEURAL

## 2022-02-03 MED ORDER — ONDANSETRON HCL 4 MG/2ML IJ SOLN
INTRAMUSCULAR | Status: AC
  Filled 2022-02-03: qty 2

## 2022-02-03 MED ORDER — DEXAMETHASONE SODIUM PHOSPHATE 10 MG/ML IJ SOLN
INTRAMUSCULAR | Status: DC | PRN
  Administered 2022-02-03: 10 mg via INTRAVENOUS

## 2022-02-03 MED ORDER — OXYCODONE-ACETAMINOPHEN 5-325 MG PO TABS
1.0000 | ORAL_TABLET | Freq: Once | ORAL | Status: AC
  Administered 2022-02-03: 1 via ORAL
  Filled 2022-02-03: qty 1

## 2022-02-03 MED ORDER — FENTANYL CITRATE (PF) 100 MCG/2ML IJ SOLN
INTRAMUSCULAR | Status: DC | PRN
Start: 2022-02-03 — End: 2022-02-03
  Administered 2022-02-03: 100 ug via INTRAVENOUS

## 2022-02-03 MED ORDER — PREDNISONE 10 MG PO TABS
10.0000 mg | ORAL_TABLET | Freq: Every day | ORAL | Status: DC
  Administered 2022-02-04 – 2022-02-06 (×3): 10 mg via ORAL
  Filled 2022-02-03 (×3): qty 1

## 2022-02-03 MED ORDER — SODIUM CHLORIDE 0.9 % IV SOLN: INTRAVENOUS | Status: DC | PRN

## 2022-02-03 MED ORDER — LIDOCAINE HCL (PF) 1 % IJ SOLN
INTRAMUSCULAR | Status: DC | PRN
  Administered 2022-02-03: 5 mL via SUBCUTANEOUS

## 2022-02-03 MED ORDER — ONDANSETRON HCL 4 MG/2ML IJ SOLN
INTRAMUSCULAR | Status: DC | PRN
  Administered 2022-02-03: 4 mg via INTRAVENOUS

## 2022-02-03 MED ORDER — METOCLOPRAMIDE HCL 5 MG PO TABS: 5.0000 mg | ORAL_TABLET | Freq: Three times a day (TID) | ORAL | Status: DC | PRN

## 2022-02-03 MED ORDER — PHENYLEPHRINE HCL-NACL 20-0.9 MG/250ML-% IV SOLN
INTRAVENOUS | Status: DC | PRN
  Administered 2022-02-03: 50 ug/min via INTRAVENOUS

## 2022-02-03 MED ORDER — ONDANSETRON HCL 4 MG/2ML IJ SOLN: 4.0000 mg | Freq: Three times a day (TID) | INTRAMUSCULAR | Status: DC | PRN

## 2022-02-03 MED ORDER — INSULIN ASPART 100 UNIT/ML IJ SOLN
0.0000 [IU] | Freq: Every day | INTRAMUSCULAR | Status: DC
  Administered 2022-02-04: 2 [IU] via SUBCUTANEOUS
  Filled 2022-02-03: qty 1

## 2022-02-03 MED ORDER — TAMSULOSIN HCL 0.4 MG PO CAPS
0.4000 mg | ORAL_CAPSULE | Freq: Every day | ORAL | Status: DC
  Administered 2022-02-04 – 2022-02-06 (×3): 0.4 mg via ORAL
  Filled 2022-02-03 (×4): qty 1

## 2022-02-03 MED ORDER — PHENYLEPHRINE 80 MCG/ML (10ML) SYRINGE FOR IV PUSH (FOR BLOOD PRESSURE SUPPORT)
PREFILLED_SYRINGE | INTRAVENOUS | Status: AC
Start: 2022-02-03 — End: ?
  Filled 2022-02-03: qty 10

## 2022-02-03 MED ORDER — FLEET ENEMA 7-19 GM/118ML RE ENEM: 1.0000 | ENEMA | Freq: Once | RECTAL | Status: DC | PRN

## 2022-02-03 MED ORDER — RENA-VITE PO TABS
1.0000 | ORAL_TABLET | Freq: Every day | ORAL | Status: DC
  Administered 2022-02-04 – 2022-02-06 (×3): 1 via ORAL
  Filled 2022-02-03 (×3): qty 1

## 2022-02-03 MED ORDER — PHENYLEPHRINE HCL (PRESSORS) 10 MG/ML IV SOLN
INTRAVENOUS | Status: DC | PRN
  Administered 2022-02-03: 160 ug via INTRAVENOUS
  Administered 2022-02-03: 80 ug via INTRAVENOUS
  Administered 2022-02-03 (×2): 160 ug via INTRAVENOUS
  Administered 2022-02-03: 80 ug via INTRAVENOUS

## 2022-02-03 MED ORDER — MELATONIN 5 MG PO TABS
5.0000 mg | ORAL_TABLET | Freq: Every day | ORAL | Status: DC
  Administered 2022-02-04 – 2022-02-06 (×3): 5 mg via ORAL
  Filled 2022-02-03 (×4): qty 1

## 2022-02-03 MED ORDER — ONDANSETRON HCL 4 MG/2ML IJ SOLN: 4.0000 mg | Freq: Four times a day (QID) | INTRAMUSCULAR | Status: DC | PRN

## 2022-02-03 MED ORDER — DROPERIDOL 2.5 MG/ML IJ SOLN
0.6250 mg | Freq: Once | INTRAMUSCULAR | Status: DC | PRN
Start: 2022-02-03 — End: 2022-02-03

## 2022-02-03 MED ORDER — CARVEDILOL 25 MG PO TABS
25.0000 mg | ORAL_TABLET | Freq: Two times a day (BID) | ORAL | Status: DC
  Administered 2022-02-04 – 2022-02-06 (×6): 25 mg via ORAL
  Filled 2022-02-03 (×7): qty 1

## 2022-02-03 MED ORDER — CEFAZOLIN SODIUM-DEXTROSE 2-4 GM/100ML-% IV SOLN
2.0000 g | Freq: Three times a day (TID) | INTRAVENOUS | Status: DC
  Administered 2022-02-04: 2 g via INTRAVENOUS
  Filled 2022-02-03: qty 100

## 2022-02-03 MED ORDER — LIDOCAINE HCL (CARDIAC) PF 100 MG/5ML IV SOSY
PREFILLED_SYRINGE | INTRAVENOUS | Status: DC | PRN
  Administered 2022-02-03: 50 mg via INTRAVENOUS

## 2022-02-03 MED ORDER — FENTANYL CITRATE (PF) 100 MCG/2ML IJ SOLN
INTRAMUSCULAR | Status: AC
  Filled 2022-02-03: qty 2

## 2022-02-03 MED ORDER — SODIUM CHLORIDE 0.9 % IV SOLN: INTRAVENOUS | Status: DC

## 2022-02-03 MED ORDER — CEFAZOLIN SODIUM-DEXTROSE 2-3 GM-%(50ML) IV SOLR
INTRAVENOUS | Status: DC | PRN
  Administered 2022-02-03: 2 g via INTRAVENOUS

## 2022-02-03 MED ORDER — SIMETHICONE 80 MG PO CHEW
80.0000 mg | CHEWABLE_TABLET | Freq: Four times a day (QID) | ORAL | Status: DC
  Administered 2022-02-04 – 2022-02-07 (×13): 80 mg via ORAL
  Filled 2022-02-03 (×16): qty 1

## 2022-02-03 MED ORDER — SENNOSIDES-DOCUSATE SODIUM 8.6-50 MG PO TABS
2.0000 | ORAL_TABLET | Freq: Two times a day (BID) | ORAL | Status: DC
  Administered 2022-02-04 – 2022-02-06 (×4): 2 via ORAL
  Filled 2022-02-03 (×7): qty 2

## 2022-02-03 MED ORDER — CHLORHEXIDINE GLUCONATE CLOTH 2 % EX PADS
6.0000 | MEDICATED_PAD | Freq: Every day | CUTANEOUS | Status: DC
  Administered 2022-02-04 – 2022-02-05 (×2): 6 via TOPICAL

## 2022-02-03 MED ORDER — SUGAMMADEX SODIUM 200 MG/2ML IV SOLN
INTRAVENOUS | Status: DC | PRN
  Administered 2022-02-03: 200 mg via INTRAVENOUS

## 2022-02-03 MED ORDER — ESMOLOL HCL 100 MG/10ML IV SOLN
INTRAVENOUS | Status: DC | PRN
  Administered 2022-02-03: 30 mg via INTRAVENOUS

## 2022-02-03 MED ORDER — BUPIVACAINE LIPOSOME 1.3 % IJ SUSP
INTRAMUSCULAR | Status: DC | PRN
  Administered 2022-02-03: 20 mL

## 2022-02-03 MED ORDER — ASPIRIN 81 MG PO TBEC
81.0000 mg | DELAYED_RELEASE_TABLET | Freq: Two times a day (BID) | ORAL | Status: DC
  Administered 2022-02-04 – 2022-02-06 (×6): 81 mg via ORAL
  Filled 2022-02-03 (×6): qty 1

## 2022-02-03 MED ORDER — MAGNESIUM HYDROXIDE 400 MG/5ML PO SUSP: 30.0000 mL | Freq: Every day | ORAL | Status: DC | PRN

## 2022-02-03 MED ORDER — SUCCINYLCHOLINE CHLORIDE 200 MG/10ML IV SOSY
PREFILLED_SYRINGE | INTRAVENOUS | Status: AC
  Filled 2022-02-03: qty 10

## 2022-02-03 MED ORDER — GABAPENTIN 100 MG PO CAPS
100.0000 mg | ORAL_CAPSULE | Freq: Every day | ORAL | Status: DC
  Administered 2022-02-04 – 2022-02-06 (×3): 100 mg via ORAL
  Filled 2022-02-03 (×4): qty 1

## 2022-02-03 MED ORDER — MORPHINE SULFATE (PF) 2 MG/ML IV SOLN
2.0000 mg | INTRAVENOUS | Status: DC | PRN
Start: 2022-02-03 — End: 2022-02-07
  Administered 2022-02-03 – 2022-02-07 (×8): 2 mg via INTRAVENOUS
  Filled 2022-02-03 (×7): qty 1

## 2022-02-03 MED ORDER — ROCURONIUM BROMIDE 10 MG/ML (PF) SYRINGE
PREFILLED_SYRINGE | INTRAVENOUS | Status: AC
  Filled 2022-02-03: qty 10

## 2022-02-03 MED ORDER — LIDOCAINE HCL (PF) 1 % IJ SOLN
INTRAMUSCULAR | Status: AC
  Filled 2022-02-03: qty 5

## 2022-02-03 MED ORDER — INSULIN GLARGINE-YFGN 100 UNIT/ML ~~LOC~~ SOLN
4.0000 [IU] | Freq: Every day | SUBCUTANEOUS | Status: DC
  Administered 2022-02-04 – 2022-02-06 (×3): 4 [IU] via SUBCUTANEOUS
  Filled 2022-02-03 (×5): qty 0.04

## 2022-02-03 MED ORDER — OXYCODONE HCL 5 MG PO TABS: 5.0000 mg | ORAL_TABLET | Freq: Once | ORAL | Status: DC | PRN

## 2022-02-03 MED ORDER — METHOCARBAMOL 500 MG PO TABS
500.0000 mg | ORAL_TABLET | Freq: Three times a day (TID) | ORAL | Status: DC | PRN
Start: 2022-02-03 — End: 2022-02-07

## 2022-02-03 MED ORDER — CEFAZOLIN SODIUM-DEXTROSE 2-4 GM/100ML-% IV SOLN: 2.0000 g | Freq: Four times a day (QID) | INTRAVENOUS | Status: DC

## 2022-02-03 MED ORDER — DEXAMETHASONE SODIUM PHOSPHATE 10 MG/ML IJ SOLN
INTRAMUSCULAR | Status: AC
  Filled 2022-02-03: qty 1

## 2022-02-03 MED ORDER — OXYCODONE-ACETAMINOPHEN 5-325 MG PO TABS: 1.0000 | ORAL_TABLET | ORAL | Status: DC | PRN

## 2022-02-03 MED ORDER — FOLIC ACID 1 MG PO TABS
1.0000 mg | ORAL_TABLET | Freq: Every day | ORAL | Status: DC
  Administered 2022-02-04 – 2022-02-05 (×2): 1 mg via ORAL
  Filled 2022-02-03 (×3): qty 1

## 2022-02-03 MED ORDER — DOCUSATE SODIUM 100 MG PO CAPS
100.0000 mg | ORAL_CAPSULE | Freq: Two times a day (BID) | ORAL | Status: DC
  Administered 2022-02-03 – 2022-02-06 (×7): 100 mg via ORAL
  Filled 2022-02-03 (×7): qty 1

## 2022-02-03 MED ORDER — PROMETHAZINE HCL 25 MG/ML IJ SOLN: 6.2500 mg | INTRAMUSCULAR | Status: DC | PRN

## 2022-02-03 MED ORDER — FENTANYL CITRATE (PF) 100 MCG/2ML IJ SOLN: 25.0000 ug | INTRAMUSCULAR | Status: DC | PRN

## 2022-02-03 MED ORDER — PANTOPRAZOLE SODIUM 40 MG PO TBEC
40.0000 mg | DELAYED_RELEASE_TABLET | Freq: Two times a day (BID) | ORAL | Status: DC
  Administered 2022-02-04 – 2022-02-06 (×6): 40 mg via ORAL
  Filled 2022-02-03 (×7): qty 1

## 2022-02-03 MED ORDER — OXYCODONE HCL 5 MG PO TABS
5.0000 mg | ORAL_TABLET | ORAL | Status: DC | PRN
  Administered 2022-02-04 – 2022-02-07 (×13): 10 mg via ORAL
  Filled 2022-02-03 (×13): qty 2

## 2022-02-03 MED ORDER — OXYCODONE HCL 5 MG/5ML PO SOLN: 5.0000 mg | Freq: Once | ORAL | Status: DC | PRN

## 2022-02-03 MED ORDER — VITAMIN D (ERGOCALCIFEROL) 1.25 MG (50000 UNIT) PO CAPS
50000.0000 [IU] | ORAL_CAPSULE | ORAL | Status: DC
  Administered 2022-02-05: 50000 [IU] via ORAL
  Filled 2022-02-03: qty 1

## 2022-02-03 MED ORDER — ATORVASTATIN CALCIUM 20 MG PO TABS
80.0000 mg | ORAL_TABLET | Freq: Every day | ORAL | Status: DC
  Administered 2022-02-04 – 2022-02-06 (×3): 80 mg via ORAL
  Filled 2022-02-03 (×4): qty 4

## 2022-02-03 MED ORDER — AMLODIPINE BESYLATE 5 MG PO TABS
5.0000 mg | ORAL_TABLET | Freq: Every day | ORAL | Status: DC
  Administered 2022-02-04 – 2022-02-06 (×3): 5 mg via ORAL
  Filled 2022-02-03 (×3): qty 1

## 2022-02-03 SURGICAL SUPPLY — 54 items
APL PRP STRL LF DISP 70% ISPRP (MISCELLANEOUS) ×1
BIT DRILL CALIBRATED 4.3MMX365 (DRILL) IMPLANT
BIT DRILL CROWE PNT TWST 4.5MM (DRILL) IMPLANT
BNDG CMPR 5X6 CHSV STRCH STRL (GAUZE/BANDAGES/DRESSINGS) ×1
BNDG COHESIVE 6X5 TAN ST LF (GAUZE/BANDAGES/DRESSINGS) ×2 IMPLANT
BNDG ELASTIC 6X5.8 VLCR STR LF (GAUZE/BANDAGES/DRESSINGS) IMPLANT
BRACE KNEE POST OP SHORT (BRACE) ×1 IMPLANT
CHLORAPREP W/TINT 26 (MISCELLANEOUS) ×2 IMPLANT
COVER BACK TABLE REUSABLE LG (DRAPES) ×2 IMPLANT
DRAPE C-ARM XRAY 36X54 (DRAPES) ×2 IMPLANT
DRAPE C-ARMOR (DRAPES) ×2 IMPLANT
DRAPE INCISE IOBAN 66X60 STRL (DRAPES) ×4 IMPLANT
DRAPE ORTHO SPLIT 77X108 STRL (DRAPES) ×4
DRAPE SURG ORHT 6 SPLT 77X108 (DRAPES) ×2 IMPLANT
DRAPE U-SHAPE 47X51 STRL (DRAPES) ×2 IMPLANT
DRILL CALIBRATED 4.3MMX365 (DRILL) ×2
DRILL CROWE POINT TWIST 4.5MM (DRILL) ×2
DRSG OPSITE POSTOP 3X4 (GAUZE/BANDAGES/DRESSINGS) ×3 IMPLANT
DRSG OPSITE POSTOP 4X6 (GAUZE/BANDAGES/DRESSINGS) ×2 IMPLANT
DRSG OPSITE POSTOP 4X8 (GAUZE/BANDAGES/DRESSINGS) IMPLANT
ELECT REM PT RETURN 9FT ADLT (ELECTROSURGICAL) ×2
ELECTRODE REM PT RTRN 9FT ADLT (ELECTROSURGICAL) ×1 IMPLANT
GAUZE SPONGE 4X4 12PLY STRL (GAUZE/BANDAGES/DRESSINGS) ×2 IMPLANT
GAUZE XEROFORM 1X8 LF (GAUZE/BANDAGES/DRESSINGS) ×2 IMPLANT
GLOVE BIO SURGEON STRL SZ8 (GLOVE) ×4 IMPLANT
GLOVE SURG UNDER LTX SZ8 (GLOVE) ×2 IMPLANT
GOWN STRL REUS W/ TWL LRG LVL3 (GOWN DISPOSABLE) ×1 IMPLANT
GOWN STRL REUS W/ TWL XL LVL3 (GOWN DISPOSABLE) ×1 IMPLANT
GOWN STRL REUS W/TWL LRG LVL3 (GOWN DISPOSABLE) ×2
GOWN STRL REUS W/TWL XL LVL3 (GOWN DISPOSABLE) ×2
GUIDEPIN VERSANAIL DSP 3.2X444 (ORTHOPEDIC DISPOSABLE SUPPLIES) ×1 IMPLANT
GUIDEWIRE BALL NOSE 100CM (WIRE) ×1 IMPLANT
HOLSTER ELECTROSUGICAL PENCIL (MISCELLANEOUS) ×2 IMPLANT
KIT TURNOVER KIT A (KITS) ×2 IMPLANT
MANIFOLD NEPTUNE II (INSTRUMENTS) ×2 IMPLANT
NAIL FEM RETRO 12X380 (Nail) ×1 IMPLANT
NS IRRIG 1000ML POUR BTL (IV SOLUTION) ×2 IMPLANT
PACK HIP PROSTHESIS (MISCELLANEOUS) ×2 IMPLANT
PAD ABD DERMACEA PRESS 5X9 (GAUZE/BANDAGES/DRESSINGS) ×1 IMPLANT
SCREW CORT TI DBL LEAD 5X32 (Screw) ×1 IMPLANT
SCREW CORT TI DBL LEAD 5X36 (Screw) ×1 IMPLANT
SCREW CORT TI DBL LEAD 5X40 (Screw) ×1 IMPLANT
SCREW CORT TI DBL LEAD 5X56 (Screw) ×1 IMPLANT
SCREW CORT TI DBL LEAD 5X70 (Screw) ×1 IMPLANT
SCREW CORT TI DBL LEAD 5X80 (Screw) ×1 IMPLANT
SCREW CORT TI DBL LEAD 5X90 (Screw) ×1 IMPLANT
SPONGE T-LAP 18X18 ~~LOC~~+RFID (SPONGE) ×10 IMPLANT
STAPLER SKIN PROX 35W (STAPLE) ×2 IMPLANT
STOCKINETTE M/LG 89821 (MISCELLANEOUS) ×2 IMPLANT
SUT VIC AB 0 CT1 36 (SUTURE) ×1 IMPLANT
SUT VIC AB 2-0 CT1 27 (SUTURE) ×4
SUT VIC AB 2-0 CT1 TAPERPNT 27 (SUTURE) ×2 IMPLANT
TUBE EXCHANGE NAIL HUMERAL (TRAUMA) ×1 IMPLANT
WATER STERILE IRR 500ML POUR (IV SOLUTION) ×2 IMPLANT

## 2022-02-03 NOTE — Transfer of Care (Signed)
Immediate Anesthesia Transfer of Care Note  Patient: Micheal Aguirre  Procedure(s) Performed: OPEN REDUCTION INTERNAL FIXATION (ORIF) DISTAL FEMUR FRACTURE (Right)  Patient Location: PACU  Anesthesia Type:General  Level of Consciousness: awake  Airway & Oxygen Therapy: Patient Spontanous Breathing and Patient connected to face mask oxygen  Post-op Assessment: Report given to RN and Post -op Vital signs reviewed and stable  Post vital signs: Reviewed  Last Vitals:  Vitals Value Taken Time  BP    Temp    Pulse    Resp    SpO2      Last Pain:         Complications: No notable events documented.

## 2022-02-03 NOTE — Assessment & Plan Note (Signed)
-   IV hydralazine as needed -Amlodipine, Coreg -Patient is also on torsemide

## 2022-02-03 NOTE — Anesthesia Preprocedure Evaluation (Addendum)
Anesthesia Evaluation  Patient identified by MRN, date of birth, ID band Patient awake    Reviewed: Allergy & Precautions, NPO status , Patient's Chart, lab work & pertinent test results, reviewed documented beta blocker date and time   History of Anesthesia Complications Negative for: history of anesthetic complications  Airway Mallampati: III  TM Distance: >3 FB Neck ROM: Full    Dental  (+) Chipped, Poor Dentition   Pulmonary neg pulmonary ROS, neg sleep apnea, neg COPD, Patient abstained from smoking.Not current smoker, former smoker,    Pulmonary exam normal breath sounds clear to auscultation       Cardiovascular Exercise Tolerance: Poor METShypertension, Pt. on medications and Pt. on home beta blockers + CAD (CAD s/p CABG), + Cardiac Stents and +CHF  (-) Past MI + dysrhythmias Atrial Fibrillation + Valvular Problems/Murmurs AI  Rhythm:Regular Rate:Normal - Systolic murmurs EKG : Atrial fibrillation with slow ventricular response Anterior infarct , age undetermined ST & T wave abnormality, consider inferolateral ischemia Abnormal ECG No previous ECGs available  H/o multiple previous hospitalizations for GIB and heart failure exacerbations  TTE performed on May 31, 2021 that demonstrated normal biventricular systolic function with a left ventricular ejection fraction of >55%, severely enlarged LA, normal IVC size and collapse and estimated RVSP in normal range at 24 mmHg.   Neuro/Psych PSYCHIATRIC DISORDERS Dementia negative neurological ROS     GI/Hepatic neg GERD  ,(+)     (-) substance abuse  ,   Endo/Other  diabetes, Type 2, Insulin Dependent  Renal/GU ESRF and DialysisRenal diseaseLeft renal mass     Musculoskeletal   Abdominal Normal abdominal exam  (+)   Peds  Hematology  (+) Blood dyscrasia, anemia , thrombocytopenia   Anesthesia Other Findings Hypokalemia Closed fracture of right distal  femur Prostate cancer metastatic to bone  Recovering from recent ORIF tibia with the use of a walker. Had mechanical fall from standing  Past Medical History: Prostate cancer  No date: Diabetes mellitus without complication (HCC) No date: ESRD (end stage renal disease) ([place]) No date: Hypercholesteremia No date: Hypertension No date: Renal disorder  Reproductive/Obstetrics                            Anesthesia Physical  Anesthesia Plan  ASA: 3  Anesthesia Plan: General   Post-op Pain Management: Regional block* and Tylenol PO (pre-op)*   Induction: Intravenous  PONV Risk Score and Plan: 3 and Ondansetron, Dexamethasone, Treatment may vary due to age or medical condition and Midazolam  Airway Management Planned: Oral ETT  Additional Equipment: None  Intra-op Plan:   Post-operative Plan: Extubation in OR  Informed Consent: I have reviewed the patients History and Physical, chart, labs and discussed the procedure including the risks, benefits and alternatives for the proposed anesthesia with the patient or authorized representative who has indicated his/her understanding and acceptance.     Dental advisory given and Consent reviewed with POA  Plan Discussed with: CRNA and Surgeon  Anesthesia Plan Comments:        Anesthesia Quick Evaluation

## 2022-02-03 NOTE — H&P (Signed)
History and Physical    Micheal Aguirre IZT:[id number] DOB: 1955-11-21 DOA: 02/03/2022  Referring MD/NP/PA:   PCP: Pcp, No   Patient coming from:  The patient is coming from home.  At baseline, pt is independent for most of ADL.        Chief Complaint: fall and right leg pain  HPI: Micheal Aguirre is a 66 y.o. male with medical history significant of ESRD-HD (MWF), hypertension, hyperlipidemia, diabetes mellitus, GERD, dCHF, A-fib not on anticoagulants, prostate cancer, who presents with fall and right leg pain.   Pt accidentally fell forward at about 2:00 PM when he was sitting in chair, trying to pick up a rock and lost his balance.  He injured his right leg above knee area, causing pain, which is constant, sharp, severe, nonradiating, aggravated by movement.  No loss of consciousness.  He strongly denies any head or neck injury.  No headache or neck pain.  Refused CT scan of head and neck. Patient does not have chest pain, cough, shortness breath.  No nausea, vomiting, diarrhea or abdominal pain.  No symptoms of UTI.  Patient states that he had dialysis yesterday.  Of note, pt has asymmetric leg edema. He underwent ORIF of right lateral tibial plateau fracture on 11/27/2021. Right leg is worse than the left.  Patient had lower extremity venous Doppler for right leg which was negative for DVT on 01/26/2022.  Data Reviewed and ED Course: pt was found to have WBC 5.7, potassium 3.1, bicarbonate 29, creatinine 3.71, BUN 37, temperature normal, blood pressure 160/96, heart rate 71, RR 18, oxygen saturation 99% on room air.  Negative chest x-ray. X-ray showed mildly displaced acute right distal femur fracture.  Patient is admitted to telemetry bed as inpatient.  Dr. Roland Rack of Ortho and Dr. Candiss Norse of renal are consulted.  X-ray of pelvis: Unusual orientation of the bones of the pelvic ring. RIGHT pubic bone is more enface and RIGHT iliac shows a more open appearance than expected. Perhaps this is somehow related  to projection. A repeat pelvic radiograph ensuring the patient is adequately positioned is suggested. If there is high suspicion for pelvic injury a CT may be warranted in this instance.   EKG: I have personally reviewed.  Atrial fibrillation, QTc 432 T wave inversion in lead I/aVL and lead V2   Review of Systems:   General: no fevers, chills, no body weight gain, has fatigue HEENT: no blurry vision, hearing changes or sore throat Respiratory: no dyspnea, coughing, wheezing CV: no chest pain, no palpitations GI: no nausea, vomiting, abdominal pain, diarrhea, constipation GU: no dysuria, burning on urination, increased urinary frequency, hematuria  Ext: Has leg edema Neuro: no unilateral weakness, numbness, or tingling, no vision change or hearing loss. Has fall Skin: no rash, no skin tear. MSK: has pain in right leg above knee area Heme: No easy bruising.  Travel history: No recent long distant travel.   Allergy:  Allergies  Allergen Reactions   Hydralazine     Other reaction(s): Kidney Disorder, Other (See Comments) Concern for drug-induced AIN in 06/2021    Lisinopril Swelling and Other (See Comments)    Angioedema    Bimatoprost     Other reaction(s): Unknown   Empagliflozin Other (See Comments)    UTI Other reaction(s): uti     Past Medical History:  Diagnosis Date   Diabetes mellitus without complication ([place])    ESRD (end stage renal disease) ([place])    Hypercholesteremia    Hypertension  Renal disorder     Past Surgical History:  Procedure Laterality Date   ORIF TIBIA FRACTURE Right 11/27/2021   Procedure: OPEN REDUCTION INTERNAL FIXATION (ORIF) TIBIA FRACTURE;  Surgeon: Thornton Park, MD;  Location: [facility] ORS;  Service: Orthopedics;  Laterality: Right;    Social History:  reports that he has quit smoking. His smoking use included cigarettes. He has never used smokeless tobacco. He reports that he does not currently use alcohol. He reports that he does  not use drugs.  Family History:  Family History  Problem Relation Age of Onset   Heart disease Mother    Heart disease Father      Prior to Admission medications   Medication Sig Start Date End Date Taking? Authorizing Provider  acetaminophen (TYLENOL) 500 MG tablet Take 1,000 mg by mouth every 8 (eight) hours as needed for mild pain or moderate pain.    [provider]  amLODipine (NORVASC) 5 MG tablet Take 5 mg by mouth daily.    [provider]  atorvastatin (LIPITOR) 80 MG tablet Take 80 mg by mouth daily.    [provider]  bisacodyl (DULCOLAX) 5 MG EC tablet Take 10 mg by mouth daily.    [provider]  calcium carbonate (TUMS - DOSED IN MG ELEMENTAL CALCIUM) 500 MG chewable tablet Chew 500 mg by mouth 2 (two) times daily.    [provider]  carvedilol (COREG) 25 MG tablet Take 25 mg by mouth 2 (two) times daily with a meal.    [provider]  folic acid (FOLVITE) 1 MG tablet Take 1 mg by mouth daily.    [provider]  gabapentin (NEURONTIN) 100 MG capsule Take 100 mg by mouth at bedtime.    [provider]  melatonin 3 MG TABS tablet Take 6 mg by mouth at bedtime.    [provider]  methocarbamol (ROBAXIN) 500 MG tablet Take 1 tablet (500 mg total) by mouth every 6 (six) hours as needed for muscle spasms. 12/02/21   Dwyane Dee, MD  Multiple Vitamins-Minerals (MULTIVITAMIN WITH MINERALS) tablet Take 1 tablet by mouth every evening.    [provider]  oxybutynin (DITROPAN) 5 MG tablet Take 5 mg by mouth every 8 (eight) hours as needed for bladder spasms.    [provider]  oxyCODONE (OXY IR/ROXICODONE) 5 MG immediate release tablet Take 1 tablet (5 mg total) by mouth every 6 (six) hours as needed for moderate pain, breakthrough pain or severe pain. 12/02/21   Dwyane Dee, MD  pantoprazole (PROTONIX) 40 MG tablet Take 40 mg by mouth every 12 (twelve) hours.    [provider]  polyethylene glycol (MIRALAX / GLYCOLAX) 17 g packet Take 17 g by mouth at bedtime.    [provider]  predniSONE (DELTASONE) 10 MG tablet Take 10 mg by mouth daily with breakfast.    [provider]  senna-docusate (SENOKOT-S) 8.6-50 MG tablet Take 2 tablets by mouth 2 (two) times daily.    [provider]  simethicone (MYLICON) 80 MG chewable tablet Chew 80 mg by mouth every 6 (six) hours.    [provider]  tamsulosin (FLOMAX) 0.4 MG CAPS capsule Take 0.4 mg by mouth at bedtime.    [provider]  torsemide (DEMADEX) 20 MG tablet Take 20 mg by mouth 2 (two) times daily.    [provider]  vitamin B-12 (CYANOCOBALAMIN) 500 MCG tablet Take 500 mcg by mouth in the morning.  [provider]  Vitamin D, Ergocalciferol, (DRISDOL) 1.25 MG (50000 UNIT) CAPS capsule Take 50,000 Units by mouth every Saturday.    [provider]  zinc oxide 20 % ointment Apply 1 application. topically every 8 (eight) hours as needed (peri-skin care).    [provider]    Physical Exam: Vitals:   02/03/22 1203 02/03/22 1426 02/03/22 1458 02/03/22 1657  BP: (!) 158/90 (!) 160/88 (!) 180/111 (!) 166/99  Pulse: 70 72 82 64  Resp: '18 18 16   '$ Temp:   98 F (36.7 C)   TempSrc:      SpO2: 99% 99% 100%   Weight:      Height:       General: Not in acute distress HEENT:       Eyes: PERRL, EOMI, no scleral icterus.       ENT: No discharge from the ears and nose, no pharynx injection, no tonsillar enlargement.        Neck: No JVD, no bruit, no mass felt. Heme: No neck lymph node enlargement. Cardiac: S1/S2, RRR, No murmurs, No gallops or rubs. Respiratory: No rales, wheezing, rhonchi or rubs. GI: Soft, nondistended, nontender, no rebound pain, no organomegaly, BS present. GU: No hematuria Ext:1+DP/PT pulse bilaterally.  Has asymmetric leg edema.  2+ on right leg and trace edema in left leg Musculoskeletal: Has  tenderness in right upper leg above knee area Skin: No rashes.  Neuro: Alert, oriented X3, cranial nerves II-XII grossly intact, moves all extremities normally.  Psych: Patient is not psychotic, no suicidal or hemocidal ideation.  Labs on Admission: I have personally reviewed following labs and imaging studies  CBC: Recent Labs  Lab 02/03/22 1207  WBC 5.7  NEUTROABS 4.8  HGB 12.2*  HCT 39.4  MCV 101.0*  PLT 258*   Basic Metabolic Panel: Recent Labs  Lab 02/03/22 1209 02/03/22 1300  NA 142  --   K 3.1*  --   CL 105  --   CO2 29  --   GLUCOSE 106*  --   BUN 37*  --   CREATININE 3.71*  --   CALCIUM 8.4*  --   MG  --  2.1   GFR: Estimated Creatinine Clearance: 19.6 mL/min (A) (by C-G formula based on SCr of 3.71 mg/dL (H)). Liver Function Tests: Recent Labs  Lab 02/03/22 1209  AST 25  ALT 28  ALKPHOS 117  BILITOT 0.7  PROT 6.9  ALBUMIN 3.7   No results for input(s): "LIPASE", "AMYLASE" in the last 168 hours. No results for input(s): "AMMONIA" in the last 168 hours. Coagulation Profile: Recent Labs  Lab 02/03/22 1600  INR 1.2   Cardiac Enzymes: No results for input(s): "CKTOTAL", "CKMB", "CKMBINDEX", "TROPONINI" in the last 168 hours. BNP (last 3 results) No results for input(s): "PROBNP" in the last 8760 hours. HbA1C: No results for input(s): "HGBA1C" in the last 72 hours. CBG: Recent Labs  Lab 02/03/22 1656  GLUCAP 101*   Lipid Profile: No results for input(s): "CHOL", "HDL", "LDLCALC", "TRIG", "CHOLHDL", "LDLDIRECT" in the last 72 hours. Thyroid Function Tests: No results for input(s): "TSH", "T4TOTAL", "FREET4", "T3FREE", "THYROIDAB" in the last 72 hours. Anemia Panel: No results for input(s): "VITAMINB12", "FOLATE", "FERRITIN", "TIBC", "IRON", "RETICCTPCT" in the last 72 hours. Urine analysis:    Component Value Date/Time   COLORURINE YELLOW (A) 11/27/2021 1052   APPEARANCEUR HAZY (A) 11/27/2021 1052   LABSPEC 1.017 11/27/2021 1052    PHURINE 5.0 11/27/2021 1052   GLUCOSEU  NEGATIVE 11/27/2021 1052   HGBUR SMALL (A) 11/27/2021 1052   BILIRUBINUR NEGATIVE 11/27/2021 1052   KETONESUR NEGATIVE 11/27/2021 1052   PROTEINUR 100 (A) 11/27/2021 1052   NITRITE NEGATIVE 11/27/2021 1052   LEUKOCYTESUR NEGATIVE 11/27/2021 1052   Sepsis Labs: '@LABRCNTIP'$ (procalcitonin:4,lacticidven:4) )No results found for this or any previous visit (from the past 240 hour(s)).   Radiological Exams on Admission: DG FEMUR PORT MIN 2 VIEWS LEFT  Addendum Date: 02/03/2022   ADDENDUM REPORT: 02/03/2022 13:31 ADDENDUM: Images and report applied to the RIGHT femur. There is a comminuted, overriding and displaced fracture of the RIGHT femoral metadiaphysis rather than the LEFT. Electronically Signed   By: Zetta Bills M.D.   On: 02/03/2022 13:31   Result Date: 02/03/2022 CLINICAL DATA:  Knee pain following fall, tibial plateau surgery 10 weeks ago. EXAM: LEFT FEMUR PORTABLE 2 VIEWS COMPARISON:  Pelvic evaluation of the same date. FINDINGS: Mildly comminuted fracture with moderate displacement and over riding of the distal LEFT femoral metadiaphysis. There is posterior and medial displacement of the distal fracture fragment by up to 1 cm with approximately 1-2 cm overlap/over riding. Signs of prior ORIF of the RIGHT tibial plateau are demonstrated. No additional fractures are identified. IMPRESSION: Mildly comminuted, displaced and over riding fracture of the distal LEFT femoral metadiaphysis as described. No additional fractures are seen. Electronically Signed: By: Zetta Bills M.D. On: 02/03/2022 12:24   DG FEMUR, MIN 2 VIEWS RIGHT  Result Date: 02/03/2022 CLINICAL DATA:  Right knee pain after a fall today. EXAM: RIGHT FEMUR 2 VIEWS COMPARISON:  Right knee radiographs 11/27/2021 FINDINGS: There is an acute, oblique, mildly comminuted fracture of the distal shaft of the femur which demonstrates mild medial and posterior displacement. The knee and hip are  located. Prior ORIF of the proximal tibia and a remote posttraumatic deformity of the proximal fibula are again noted. Soft tissue swelling and atherosclerotic vascular calcifications are noted. IMPRESSION: Mildly displaced acute distal femur fracture. Electronically Signed   By: Logan Bores M.D.   On: 02/03/2022 13:21   DG Pelvis 1-2 Views  Result Date: 02/03/2022 CLINICAL DATA:  A 66 year old male presents for evaluation of hip pain and knee pain following fall. EXAM: PELVIS - 1-2 VIEW COMPARISON:  Femoral evaluation of the same date. FINDINGS: Osteopenia. Images mildly rotated. The orientation of the iliac bones is disparate with that of the pubic bones accounting for rotation, it is unclear whether this is projectional. Based on orientation of the pubic bone on the RIGHT in the iliac bone on the RIGHT it would be difficult to exclude the possibility of pelvic ring injury. Recommend attempt at repeat imaging or CT for further assessment. . IMPRESSION: Unusual orientation of the bones of the pelvic ring. RIGHT pubic bone is more enface and RIGHT iliac shows a more open appearance than expected. Perhaps this is somehow related to projection. A repeat pelvic radiograph ensuring the patient is adequately positioned is suggested. If there is high suspicion for pelvic injury a CT may be warranted in this instance. Electronically Signed   By: Zetta Bills M.D.   On: 02/03/2022 12:39   DG Chest Portable 1 View  Result Date: 02/03/2022 CLINICAL DATA:  RIGHT knee pain following fall. EXAM: PORTABLE CHEST 1 VIEW COMPARISON:  None available FINDINGS: Dialysis catheter in situ tip at the upper portion of the RIGHT atrium. Post median sternotomy for CABG. Cardiomediastinal contours and hilar structures are normal accounting for low depth of expansion with basilar atelectasis. No sign  of consolidation. No pneumothorax. On limited assessment no acute skeletal findings. IMPRESSION: 1. Signs of basilar atelectasis  without acute cardiopulmonary disease. 2. Dialysis catheter in situ tip at the upper portion of the RIGHT atrium. Electronically Signed   By: Zetta Bills M.D.   On: 02/03/2022 12:30      Assessment/Plan Principal Problem:   Closed fracture of right distal femur (HCC) Active Problems:   Fall at home, initial encounter   ESRD on dialysis ([place])   Chronic diastolic CHF (congestive heart failure) (HCC)   HTN (hypertension)   HLD (hyperlipidemia)   Type II diabetes mellitus with renal manifestations (HCC)   Atrial fibrillation, chronic (HCC)   Hypokalemia   Principal Problem:   Closed fracture of right distal femur ([place]) Active Problems:   Fall at home, initial encounter   ESRD on dialysis ([place])   Chronic diastolic CHF (congestive heart failure) (HCC)   HTN (hypertension)   HLD (hyperlipidemia)   Type II diabetes mellitus with renal manifestations (HCC)   Atrial fibrillation, chronic (HCC)   Hypokalemia   Assessment and Plan: * Closed fracture of right distal femur ([place])  As evidenced by x-ray. Patient has moderate pain now. No neurovascular compromise. Orthopedic surgeon, Dr. Roland Rack was consulted.   - will admit to tele bed as inpt - Pain control: morphine prn and percocet - When necessary Zofran for nausea - Robaxin for muscle spasm - type and cross - INR/PTT - PT/OT when able to (not ordered now)  Fall at home, initial encounter -PT/OT when able to   ESRD on dialysis [facility]) Patient had dialysis yesterday.  -Consulted Dr. Candiss Norse of renal  Chronic diastolic CHF (congestive heart failure) [facility]) Patient has asymmetric leg edema, right leg is worse than the left.  Patient had negative lower extremity venous Doppler for DVT recently.  No shortness breath or chest pain.  No pulm edema chest x-ray.  CHF seems to be compensated.  2D echo on 05/31/2021 showed EF> 55%. -Volume management per renal by dialysis  HTN (hypertension) - IV hydralazine as needed -Amlodipine,  Coreg -Patient is also on torsemide  HLD (hyperlipidemia) - Lipitor  Type II diabetes mellitus with renal manifestations (HCC) Recent A1c 5.6, well controlled.  Patient is taking NovoLog and Lantus 8 units daily -Sliding scale insulin -Glargine insulin 4 units daily  Atrial fibrillation, chronic (HCC) Heart rate is 71.  Patient not taking anticoagulants. -Continue Coreg  Hypokalemia Potassium is 3.1 -Will not replete potassium since patient is ESRD -Check magnesium level    Perioperative Cardiac Risk: pt has multiple comorbidities as listed above, including dCHF, atrial fibrillation not taking anticoagulants and ESRD on dialysis.  Patient seems to have euvolemic.  Denies any shortness breath or chest pain.  Patient has been compliant to dialysis.  Last dialysis was yesterday. No signs of acute CHF exacerbation. EKG showed chronic A fib.  EKG has T wave inversion in lead I/aVL and V2, but no any chest pain. At this time point, no further work up is needed. Patient's GUPTA score perioperative myocardial infarction or cardaic arrest is 3.12%.  I discussed the risk with patient, pt would like to proceed for surgery.    DVT ppx: SCD  Code Status: Full code  Family Communication:  Yes, patient's daughter  by phone  Disposition Plan:  Anticipate discharge back to previous environment  Consults called:  Dr. Roland Rack of ortho and Dr.Singh of renal  Admission status and Level of care: Telemetry Medical:    as inpt  progressive unit for obs   as inpt      SDU/inpation         Severity of Illness:  The appropriate patient status for this patient is INPATIENT. Inpatient status is judged to be reasonable and necessary in order to provide the required intensity of service to ensure the patient's safety. The patient's presenting symptoms, physical exam findings, and initial radiographic and laboratory data in the context of their chronic comorbidities is felt to place them at high risk for  further clinical deterioration. Furthermore, it is not anticipated that the patient will be medically stable for discharge from the hospital within 2 midnights of admission.   * I certify that at the point of admission it is my clinical judgment that the patient will require inpatient hospital care spanning beyond 2 midnights from the point of admission due to high intensity of service, high risk for further deterioration and high frequency of surveillance required.*       Date of Service 02/03/2022    Ivor Costa [facility]   If 7PM-7AM, please contact night-coverage [email or web address] 02/03/2022, 5:00 PM

## 2022-02-03 NOTE — ED Notes (Signed)
See triage note  presents via EMS s/p fall  states he lost his balance   fell   having pain to right knee   states pain is from just above knee and below knee

## 2022-02-03 NOTE — Anesthesia Procedure Notes (Signed)
Anesthesia Regional Block: Femoral nerve block   Pre-Anesthetic Checklist: , timeout performed,  Correct Patient, Correct Site, Correct Laterality,  Correct Procedure, Correct Position, site marked,  Risks and benefits discussed,  Surgical consent,  Pre-op evaluation,  At surgeon's request and post-op pain management  Laterality: Right  Prep: chloraprep       Needles:  Injection technique: Single-shot  Needle Type: Stimiplex     Needle Length: 9cm  Needle Gauge: 22     Additional Needles:   Procedures:,,,, ultrasound used (permanent image in chart),,    Narrative:  Start time: 02/03/2022 5:50 PM End time: 02/03/2022 6:04 PM Injection made incrementally with aspirations every 25 mL.  Performed by: Personally  Anesthesiologist: Iran Ouch, MD  Additional Notes: Patient consented for risk and benefits of nerve block including but not limited to nerve damage, failed block, bleeding and infection.  Patient voiced understanding.  Functioning IV was confirmed and monitors were applied.  Timeout done prior to procedure and prior to any sedation being given to the patient.  Patient confirmed procedure site prior to any sedation given to the patient.  A 17m 22ga Stimuplex needle was used. Sterile prep,hand hygiene and sterile gloves were used.  Minimal sedation used for procedure.  No paresthesia endorsed by patient during the procedure.  Negative aspiration and negative test dose prior to incremental administration of local anesthetic. The patient tolerated the procedure well with no immediate complications.

## 2022-02-03 NOTE — Assessment & Plan Note (Addendum)
Recent A1c 5.6, well controlled.  Patient is taking NovoLog and Lantus 8 units daily -Sliding scale insulin -Glargine insulin 4 units daily

## 2022-02-03 NOTE — ED Provider Notes (Signed)
[facility] Provider Note    Event Date/Time   First MD Initiated Contact with Patient 02/03/22 1117     (approximate)   History   Fall   HPI  Chesley Veasey is a 66 y.o. male with history of reconstruction to the right knee, hypertension, diabetes and dialysis for end-stage renal disease presents emergency department from [facility] with complaints of a fall and right leg pain.  Patient states he was in his wheelchair and saw you for rocks that he wanted to pick up and leaned forward falling onto the right leg.  Patient denies any other injuries.      Physical Exam   Triage Vital Signs: ED Triage Vitals [02/03/22 1056]  Enc Vitals Group     BP (!) 160/96     Pulse Rate 71     Resp 18     Temp 98 F (36.7 C)     Temp Source Oral     SpO2 99 %     Weight 180 lb (81.6 kg)     Height '5\' 9"'$  (1.753 m)     Head Circumference      Peak Flow      Pain Score 10     Pain Loc      Pain Edu?      Excl. in [place]?     Most recent vital signs: Vitals:   02/03/22 1056 02/03/22 1203  BP: (!) 160/96 (!) 158/90  Pulse: 71 70  Resp: 18 18  Temp: 98 F (36.7 C)   SpO2: 99% 99%     General: Awake, no distress.   CV:  Good peripheral perfusion. regular rate and  rhythm Resp:  Normal effort.  Abd:  No distention.   Other:  Right femur is tender distally, tib-fib is tender proximally, upper femur and hip/pelvis are nontender, patient has decreased range of motion, neurovascular is intact   ED Results / Procedures / Treatments   Labs (all labs ordered are listed, but only abnormal results are displayed) Labs Reviewed  CBC WITH DIFFERENTIAL/PLATELET - Abnormal; Notable for the following components:      Result Value   RBC 3.90 (*)    Hemoglobin 12.2 (*)    MCV 101.0 (*)    RDW 17.0 (*)    Platelets 125 (*)    Lymphs Abs 0.6 (*)    All other components within normal limits  COMPREHENSIVE METABOLIC PANEL - Abnormal; Notable for the following  components:   Potassium 3.1 (*)    Glucose, Bld 106 (*)    BUN 37 (*)    Creatinine, Ser 3.71 (*)    Calcium 8.4 (*)    GFR, Estimated 17 (*)    All other components within normal limits  PROTIME-INR  APTT  MAGNESIUM  TYPE AND SCREEN     EKG  EKG   RADIOLOGY X-ray of the pelvis, femur    PROCEDURES:   Procedures   MEDICATIONS ORDERED IN ED: Medications  oxyCODONE-acetaminophen (PERCOCET/ROXICET) 5-325 MG per tablet 1 tablet (has no administration in time range)  morphine (PF) 2 MG/ML injection 2 mg (has no administration in time range)  methocarbamol (ROBAXIN) tablet 500 mg (has no administration in time range)  ondansetron (ZOFRAN) injection 4 mg (has no administration in time range)  acetaminophen (TYLENOL) tablet 650 mg (has no administration in time range)  hydrALAZINE (APRESOLINE) injection 5 mg (has no administration in time range)  senna-docusate (Senokot-S) tablet 1 tablet (has no administration  in time range)  oxyCODONE-acetaminophen (PERCOCET/ROXICET) 5-325 MG per tablet 1 tablet (1 tablet Oral Given 02/03/22 1130)  morphine (PF) 4 MG/ML injection 4 mg (4 mg Intravenous Given 02/03/22 1159)  ondansetron (ZOFRAN) injection 4 mg (4 mg Intravenous Given 02/03/22 1159)     IMPRESSION / MDM / ASSESSMENT AND PLAN / ED COURSE  I reviewed the triage vital signs and the nursing notes.                              Differential diagnosis includes, but is not limited to, hip fracture, femur fracture, tib-fib fracture, contusion, strain  Patient's presentation is most consistent with acute presentation with potential threat to life or bodily function.   Patient's x-rays interpreted by me as having a fracture of the distal femur which is displaced. Chest x-ray appears to have chronic findings awaiting radiology read, radiologist read as no acute changes X-ray of the pelvis interpreted by me as being negative for fracture  Labs are reassuring, CBC and metabolic  panel are normal  EKG interpreted as having slow A-fib.  See physician read  Consult to Dr. Roland Rack from orthopedics.  We will try to do surgery today at 4 PM.  Keep patient n.p.o.  Consult the hospitalist for admission since patient is dialysis patient with A-fib.  Patient was given Percocet p.o. on arrival.  Morphine 4 mg IV along with Zofran.  Hospitalist admitting the patient.  Dr. Corky Downs spoke with hospitalist.        FINAL CLINICAL IMPRESSION(S) / ED DIAGNOSES   Final diagnoses:  Closed fracture of distal end of left femur, unspecified fracture morphology, initial encounter [facility])     Rx / DC Orders   ED Discharge Orders     None        Note:  This document was prepared using Dragon voice recognition software and may include unintentional dictation errors.    Versie Starks, PA-C 02/03/22 1259    Lavonia Drafts, MD 02/03/22 1312

## 2022-02-03 NOTE — Assessment & Plan Note (Signed)
Lipitor 

## 2022-02-03 NOTE — Assessment & Plan Note (Addendum)
Heart rate is 71.  -Continue Eliquis -Continue Coreg

## 2022-02-03 NOTE — Assessment & Plan Note (Addendum)
Okay patient has asymmetric leg edema, right leg is worse than the left.  Patient had negative lower extremity venous Doppler for DVT recently.  No shortness breath or chest pain.  No pulm edema chest x-ray.  CHF seems to be compensated.  2D echo on 05/31/2021 showed EF> 55%. -Volume management per renal by dialysis

## 2022-02-03 NOTE — Progress Notes (Signed)
Nutrition Brief Note  RD consulted for assessment of nutritional status/ needs.   Wt Readings from Last 15 Encounters:  02/03/22 81.6 kg  12/01/21 82.7 kg   Pt with history of reconstruction to the right knee, hypertension, diabetes and dialysis for end-stage renal disease presents with complaints of a fall and right leg pain.  Pt admitted with rt femur fracture.   Case discussed with RN; determining timing of surgery. Pt awaiting orthopedics consult.   Spoke with pt at bedside, who was pleasant and in good spirits today. Pt shares that he found a "nice, geometrically shaped rock" outside and he got out of his wheelchair to pick it up he fell.   Pt reports good appetite, eating "whenever I can". He reports tolerating HD treatments well. He will sometimes skip lunch on HD days due to timing of treatments. He lives at [place].   Pt denies any weight loss, but unsure of der weught or UBW.   Medications reviewed and include calcium carbonate, folic acid, miralax, demadex, senokot, vitamin B-12, and vitamin D.   Nutrition-Focused physical exam completed. Findings are no fat depletion, no muscle depletion, and no edema.    Lab Results  Component Value Date   HGBA1C 5.6 11/27/2021   PTA DM medications are 4 units insulin aspart daily and 8 units insulin glargine daily.   Labs reviewed: K: 3.1, CBGS: 110 (inpatient orders for glycemic control are 0-5 units insulin aspart daily at bedtime, 0-9 units insulin aspart TID with meals, and 4 units insulin glargine-yfgn daily).    Current diet order is NPO, patient is consuming approximately n/a% of meals at this time. Labs and medications reviewed.   No nutrition interventions warranted at this time. If nutrition issues arise, please consult RD.   Micheal Aguirre, RD, LDN, [place] Registered Dietitian II Certified Diabetes Care and Education Specialist Please refer to [facility] for RD and/or RD on-call/weekend/after hours pager

## 2022-02-03 NOTE — Op Note (Signed)
02/03/2022  9:02 PM  Patient:   Micheal Aguirre  Pre-Op Diagnosis:   Closed displaced extra-articular supracondylar right femur fracture.  Post-Op Diagnosis:   Same  Procedure:   Retrograde intramedullary nailing of closed displaced extra-articular supracondylar right femur fracture.  Surgeon:   Pascal Lux, MD  Assistant:   None  Anesthesia:   GET  Findings:   As above.  Complications:   None  Fluids:   400 cc crystalloid  EBL:   100 cc  UOP:   None  TT:   None  Drains:   None  Closure:   Staples  Implants:   [employer] [place] 12 x 380 mm retrograde IM nail with 4 distal and 2 proximal interlocking screws.  Brief Clinical Note:   The patient is a 66 year old male who sustained the above-noted injury earlier today when he fell out of his wheelchair trying to pick up a rock.  He was brought to the emergency room where x-rays demonstrated the above-noted injury.  The patient has been cleared medically and presents at this time for definitive management of this injury.  Procedure:   The patient was brought into the operating room and lain in the supine position.  After adequate general endotracheal intubation and anesthesia were obtained, the patient's right lower extremity was prepped with ChloraPrep solution before being draped sterilely.  Preoperative antibiotics were administered.  A timeout was performed to verify the appropriate surgical site.    An approximately 5-6 cm incision was made anteriorly at the knee.  The incision was carried down through the subcutaneous tissues.  The medial border of the patella tendon was identified and this plane developed to access the joint.  The medial retinaculum was incised proximally along the medial portion of the distal margin of the patella, leaving several millimeters of soft tissue attached to the patella, in order to facilitate access to the notch.  Under fluoroscopic imaging, a guidewire was passed into the distal portion of the femur  just anterior and lateral to the PCL insertion site.  After verifying its position fluoroscopically in AP and lateral projections, the guidewire was overreamed with the appropriate sized entry reamer to the appropriate depth.    A guidewire was passed up through the distal fragment and across the fracture into the proximal shaft.  After verifying its position proximally, the length of the guidewire was measured and found to be 390 mm.  Therefore, a 380 mm nail was selected.  The canal was reamed sequentially beginning with a 12 mm reamer and progressing to a 13.5 mm reamer.  Good cortical chatter was encountered, so a 12 x 380 mm nail was selected.  This nail was attached to the distal guide system and inserted into the femur.  Before advancing the nail across the fracture site, the adequacy of fracture alignment was verified fluoroscopically in AP and lateral projections and found to be satisfactory.  The nail was advanced to the appropriate depth and its position verified fluoroscopically in AP and lateral projections both proximally and distally.  Distally, utilizing the appropriate guide system, two transverse and two oblique interlocking screws were placed.  Again the adequacy of screw position and length was verified fluoroscopically.     Proximally, using the perfect circle technique, two anterior to posterior directed interlocking screws were inserted.  After determining the appropriate length, each of these screws were inserted and the position was assessed fluoroscopically.  The adequacy of proximal and distal interlocking screw position, as well as  the fracture alignment itself was verified fluoroscopically in AP and lateral projections and found to be excellent.  One screw was removed and replaced with a shorter screw.   The wounds were copiously irrigated with sterile saline solution before the medial retinaculum was reapproximated using #0 Vicryl interrupted sutures.  The subcutaneous tissues  were closed in two layers using 2-0 Vicryl interrupted sutures before the skin was closed with staples.  Each of the stab wounds were reapproximated using 2-0 Vicryl interrupted sutures before the skin was closed using staples.  Sterile occlusive dressings were applied to all wounds before the right lower extremity was placed into a hinged knee brace with hinges set at 0-90 degrees but locked in extension.  The patient was then awakened, extubated, and returned to the recovery room in satisfactory condition after tolerating the procedure well.

## 2022-02-03 NOTE — Assessment & Plan Note (Addendum)
Potassium is 3.1, magnesium at 2.1 -Will not replete potassium since patient is ESRD

## 2022-02-03 NOTE — ED Notes (Signed)
Report given to OR nurse  pt is to go to room

## 2022-02-03 NOTE — Assessment & Plan Note (Signed)
-  PT/OT when able to 

## 2022-02-03 NOTE — Anesthesia Procedure Notes (Signed)
Procedure Name: Intubation Date/Time: 02/03/2022 7:02 PM  Performed by: Rolla Plate, CRNAPre-anesthesia Checklist: Patient identified, Patient being monitored, Timeout performed, Emergency Drugs available and Suction available Patient Re-evaluated:Patient Re-evaluated prior to induction Oxygen Delivery Method: Circle system utilized Preoxygenation: Pre-oxygenation with 100% oxygen Induction Type: IV induction and Rapid sequence Laryngoscope Size: 4 and McGraph Grade View: Grade I Tube type: Oral Tube size: 7.5 mm Number of attempts: 1 Airway Equipment and Method: Stylet and Video-laryngoscopy Placement Confirmation: ETT inserted through vocal cords under direct vision, positive ETCO2 and breath sounds checked- equal and bilateral Secured at: 22 cm Tube secured with: Tape Dental Injury: Teeth and Oropharynx as per pre-operative assessment

## 2022-02-03 NOTE — ED Triage Notes (Signed)
Pt here via [facility] with a fall. Pt states he was trying to pick up a rock and lost his balance. Pt c/o right knee and hip pain. Pt A&Ox3.

## 2022-02-03 NOTE — Assessment & Plan Note (Addendum)
Patient was taken to the OR for retrograde intramedullary nail placement on 6/15. Tolerated the procedure well.  He will be nonweightbearing until recommended by orthopedic surgery.  Right knee brace was in place. Awaiting PT/OT evaluation for disposition. -Continue with pain management -Continue with supportive care

## 2022-02-03 NOTE — Assessment & Plan Note (Addendum)
Patient had schedule on MWF, no missed dialysis. -Continue dialysis per nephrology

## 2022-02-03 NOTE — Consult Note (Signed)
ORTHOPAEDIC CONSULTATION  REQUESTING PHYSICIAN: Ivor Costa, MD  Chief Complaint:   Right thigh pain.  History of Present Illness: Micheal Aguirre is a 66 y.o. male with multiple medical problems including diabetes, end-stage renal disease requiring dialysis, hypercholesterolemia, hypertension, and dementia who lives in an assisted living facility.  The patient was in his usual state of health until earlier this morning when he apparently fell out of his wheelchair while trying to pick up an unusual rock from the ground.  He was brought to the emergency room where x-rays demonstrated a displaced right distal femoral shaft fracture.  The patient denies any associated injuries.  He did not strike his head or lose consciousness.  The patient also denies any lightheadedness, dizziness, chest pain, shortness of breath, or other symptoms which may have precipitated his fall.  Of note, the patient is now approximately 10 weeks status post an open reduction and internal fixation of a right tibial plateau fracture by Dr. Mack Guise which appears to be healing well and appears to be unaffected by his fall today.  Past Medical History:  Diagnosis Date   Diabetes mellitus without complication ([place])    ESRD (end stage renal disease) ([place])    Hypercholesteremia    Hypertension    Renal disorder    Past Surgical History:  Procedure Laterality Date   ORIF TIBIA FRACTURE Right 11/27/2021   Procedure: OPEN REDUCTION INTERNAL FIXATION (ORIF) TIBIA FRACTURE;  Surgeon: Thornton Park, MD;  Location: [facility] ORS;  Service: Orthopedics;  Laterality: Right;   Social History   Socioeconomic History   Marital status: Divorced    Spouse name: Not on file   Number of children: Not on file   Years of education: Not on file   Highest education level: Not on file  Occupational History   Not on file  Tobacco Use   Smoking status: Former    Types: Cigarettes    Smokeless tobacco: Never  Vaping Use   Vaping Use: Never used  Substance and Sexual Activity   Alcohol use: Not Currently   Drug use: Never   Sexual activity: Not on file  Other Topics Concern   Not on file  Social History Narrative   Not on file   Social Determinants of Health   Financial Resource Strain: Not on file  Food Insecurity: Not on file  Transportation Needs: Not on file  Physical Activity: Not on file  Stress: Not on file  Social Connections: Not on file   Family History  Problem Relation Age of Onset   Heart disease Mother    Heart disease Father    Allergies  Allergen Reactions   Hydralazine     Other reaction(s): Kidney Disorder, Other (See Comments) Concern for drug-induced AIN in 06/2021    Lisinopril Swelling and Other (See Comments)    Angioedema    Bimatoprost     Other reaction(s): Unknown   Empagliflozin Other (See Comments)    UTI Other reaction(s): uti    Prior to Admission medications   Medication Sig Start Date End Date Taking? Authorizing Provider  amLODipine (NORVASC) 5 MG tablet Take 5 mg by mouth daily.   Yes [provider]  aspirin EC 81 MG tablet Take 81 mg by mouth 2 (two) times daily. Swallow whole.   Yes [provider]  atorvastatin (LIPITOR) 80 MG tablet Take 80 mg by mouth daily.   Yes [provider]  bisacodyl (DULCOLAX) 5 MG EC tablet Take 10 mg by mouth daily.  Yes [provider]  calcium carbonate (TUMS - DOSED IN MG ELEMENTAL CALCIUM) 500 MG chewable tablet Chew 500 mg by mouth 2 (two) times daily.   Yes [provider]  carvedilol (COREG) 25 MG tablet Take 25 mg by mouth 2 (two) times daily with a meal.   Yes [provider]  folic acid (FOLVITE) 1 MG tablet Take 1 mg by mouth daily.   Yes [provider]  gabapentin (NEURONTIN) 100 MG capsule Take 100 mg by mouth at bedtime.   Yes [provider]  LANTUS SOLOSTAR 100 UNIT/ML Solostar Pen Inject 8  Units into the skin at bedtime. HOLD FOR BLOOD SUGAR LESS THAN 120 01/22/22  Yes [provider]  melatonin 3 MG TABS tablet Take 6 mg by mouth at bedtime.   Yes [provider]  Multiple Vitamins-Minerals (MULTIVITAMIN WITH MINERALS) tablet Take 1 tablet by mouth every evening.   Yes [provider]  NOVOLOG FLEXPEN 100 UNIT/ML FlexPen Inject 4 Units into the skin daily with lunch. 01/30/22  Yes [provider]  pantoprazole (PROTONIX) 40 MG tablet Take 40 mg by mouth every 12 (twelve) hours.   Yes [provider]  polyethylene glycol (MIRALAX / GLYCOLAX) 17 g packet Take 17 g by mouth at bedtime.   Yes [provider]  predniSONE (DELTASONE) 10 MG tablet Take 10 mg by mouth daily with breakfast.   Yes [provider]  senna-docusate (SENOKOT-S) 8.6-50 MG tablet Take 2 tablets by mouth 2 (two) times daily.   Yes [provider]  simethicone (MYLICON) 80 MG chewable tablet Chew 80 mg by mouth every 6 (six) hours.   Yes [provider]  tamsulosin (FLOMAX) 0.4 MG CAPS capsule Take 0.4 mg by mouth at bedtime.   Yes [provider]  torsemide (DEMADEX) 20 MG tablet Take 20 mg by mouth 2 (two) times daily.   Yes [provider]  vitamin B-12 (CYANOCOBALAMIN) 500 MCG tablet Take 500 mcg by mouth in the morning.   Yes [provider]  acetaminophen (TYLENOL) 500 MG tablet Take 1,000 mg by mouth every 8 (eight) hours as needed for mild pain or moderate pain.    [provider]  methocarbamol (ROBAXIN) 500 MG tablet Take 1 tablet (500 mg total) by mouth every 6 (six) hours as needed for muscle spasms. 12/02/21   Dwyane Dee, MD  oxybutynin (DITROPAN) 5 MG tablet Take 5 mg by mouth every 8 (eight) hours as needed for bladder spasms.    [provider]  oxyCODONE (OXY IR/ROXICODONE) 5 MG immediate release tablet Take 1 tablet (5 mg total) by mouth every 6 (six) hours as needed for  moderate pain, breakthrough pain or severe pain. 12/02/21   Dwyane Dee, MD  Vitamin D, Ergocalciferol, (DRISDOL) 1.25 MG (50000 UNIT) CAPS capsule Take 50,000 Units by mouth every Saturday.    [provider]  zinc oxide 20 % ointment Apply 1 application. topically every 8 (eight) hours as needed (peri-skin care).    [provider]   DG FEMUR PORT MIN 2 VIEWS LEFT  Addendum Date: 02/03/2022   ADDENDUM REPORT: 02/03/2022 13:31 ADDENDUM: Images and report applied to the RIGHT femur. There is a comminuted, overriding and displaced fracture of the RIGHT femoral metadiaphysis rather than the LEFT. Electronically Signed   By: Zetta Bills M.D.   On: 02/03/2022 13:31   Result Date: 02/03/2022 CLINICAL DATA:  Knee pain following fall, tibial plateau surgery 10 weeks ago. EXAM:  LEFT FEMUR PORTABLE 2 VIEWS COMPARISON:  Pelvic evaluation of the same date. FINDINGS: Mildly comminuted fracture with moderate displacement and over riding of the distal LEFT femoral metadiaphysis. There is posterior and medial displacement of the distal fracture fragment by up to 1 cm with approximately 1-2 cm overlap/over riding. Signs of prior ORIF of the RIGHT tibial plateau are demonstrated. No additional fractures are identified. IMPRESSION: Mildly comminuted, displaced and over riding fracture of the distal LEFT femoral metadiaphysis as described. No additional fractures are seen. Electronically Signed: By: Zetta Bills M.D. On: 02/03/2022 12:24   DG FEMUR, MIN 2 VIEWS RIGHT  Result Date: 02/03/2022 CLINICAL DATA:  Right knee pain after a fall today. EXAM: RIGHT FEMUR 2 VIEWS COMPARISON:  Right knee radiographs 11/27/2021 FINDINGS: There is an acute, oblique, mildly comminuted fracture of the distal shaft of the femur which demonstrates mild medial and posterior displacement. The knee and hip are located. Prior ORIF of the proximal tibia and a remote posttraumatic deformity of the proximal fibula are again  noted. Soft tissue swelling and atherosclerotic vascular calcifications are noted. IMPRESSION: Mildly displaced acute distal femur fracture. Electronically Signed   By: Logan Bores M.D.   On: 02/03/2022 13:21   DG Pelvis 1-2 Views  Result Date: 02/03/2022 CLINICAL DATA:  A 66 year old male presents for evaluation of hip pain and knee pain following fall. EXAM: PELVIS - 1-2 VIEW COMPARISON:  Femoral evaluation of the same date. FINDINGS: Osteopenia. Images mildly rotated. The orientation of the iliac bones is disparate with that of the pubic bones accounting for rotation, it is unclear whether this is projectional. Based on orientation of the pubic bone on the RIGHT in the iliac bone on the RIGHT it would be difficult to exclude the possibility of pelvic ring injury. Recommend attempt at repeat imaging or CT for further assessment. . IMPRESSION: Unusual orientation of the bones of the pelvic ring. RIGHT pubic bone is more enface and RIGHT iliac shows a more open appearance than expected. Perhaps this is somehow related to projection. A repeat pelvic radiograph ensuring the patient is adequately positioned is suggested. If there is high suspicion for pelvic injury a CT may be warranted in this instance. Electronically Signed   By: Zetta Bills M.D.   On: 02/03/2022 12:39   DG Chest Portable 1 View  Result Date: 02/03/2022 CLINICAL DATA:  RIGHT knee pain following fall. EXAM: PORTABLE CHEST 1 VIEW COMPARISON:  None available FINDINGS: Dialysis catheter in situ tip at the upper portion of the RIGHT atrium. Post median sternotomy for CABG. Cardiomediastinal contours and hilar structures are normal accounting for low depth of expansion with basilar atelectasis. No sign of consolidation. No pneumothorax. On limited assessment no acute skeletal findings. IMPRESSION: 1. Signs of basilar atelectasis without acute cardiopulmonary disease. 2. Dialysis catheter in situ tip at the upper portion of the RIGHT atrium.  Electronically Signed   By: Zetta Bills M.D.   On: 02/03/2022 12:30    Positive ROS: All other systems have been reviewed and were otherwise negative with the exception of those mentioned in the HPI and as above.  Physical Exam: General:  Alert, no acute distress Psychiatric:  Patient appears to be competent for consent with normal mood and affect   Cardiovascular:  No pedal edema Respiratory:  No wheezing, non-labored breathing GI:  Abdomen is soft and non-tender Skin:  No lesions in the area of chief complaint Neurologic:  Sensation intact distally Lymphatic:  No axillary or cervical lymphadenopathy  Orthopedic Exam:  Orthopedic examination is limited to the right lower extremity.  Skin inspection around the distal thigh is notable for moderate swelling, as well as some deformity, but otherwise is unremarkable.  No erythema, ecchymosis, abrasions, or other skin abnormalities are identified.  There is a well-healed surgical incision over the lateral aspect of the proximal tibia, consistent with his recent tibial plateau surgery.  He has moderate tenderness to palpation in the area, and more severe pain with any attempted active or passive motion of the knee.  He is able to actively dorsiflex and plantarflex his toes and ankle.  He has fair capillary refill to his foot.  X-rays:  X-rays of the right femur are available for review and have been reviewed by myself.  The findings are as described above.  There is a mildly displaced and comminuted distal femoral shaft fracture that is primarily transverse and approximately 6 to 7 cm above the joint.  Mild degenerative changes of the knee joint are noted.  No lytic lesions or other acute bony abnormalities are identified, other than diffuse osteopenia.  Assessment: Closed displaced right distal femoral shaft fracture.  Plan: The treatment options, including both surgical and nonsurgical choices, have been discussed in detail with the patient  and his daughter who is the patient's power of attorney.  Both she and the patient would like to proceed with surgical intervention to include a reduction and internal fixation of the right distal femoral shaft fracture.  The risks (including bleeding, infection, nerve and/or blood vessel injury, persistent or recurrent pain, malunion and/or nonunion, stiffness of the knee, need for further surgery, blood clots, strokes, heart attacks or arrhythmias, pneumonia, etc.) and benefits of the surgical procedure were discussed.  The patient and her daughter state their understanding and agree to proceed.  A formal written consent has been obtained.  Thank you for asking me to participate in the care of this most delightful yet unfortunate man.  I will be happy to follow him with you.   Pascal Lux, MD  Beeper #:  [phone]  02/03/2022 5:07 PM

## 2022-02-04 ENCOUNTER — Other Ambulatory Visit: Payer: Self-pay

## 2022-02-04 ENCOUNTER — Encounter: Payer: Self-pay | Admitting: Surgery

## 2022-02-04 DIAGNOSIS — I5032 Chronic diastolic (congestive) heart failure: Secondary | ICD-10-CM | POA: Diagnosis not present

## 2022-02-04 DIAGNOSIS — I482 Chronic atrial fibrillation, unspecified: Secondary | ICD-10-CM | POA: Diagnosis not present

## 2022-02-04 DIAGNOSIS — E1122 Type 2 diabetes mellitus with diabetic chronic kidney disease: Secondary | ICD-10-CM

## 2022-02-04 DIAGNOSIS — S72401A Unspecified fracture of lower end of right femur, initial encounter for closed fracture: Secondary | ICD-10-CM | POA: Diagnosis not present

## 2022-02-04 DIAGNOSIS — N186 End stage renal disease: Secondary | ICD-10-CM | POA: Diagnosis not present

## 2022-02-04 LAB — BASIC METABOLIC PANEL
Anion gap: 12 (ref 5–15)
BUN: 51 mg/dL — ABNORMAL HIGH (ref 8–23)
CO2: 23 mmol/L (ref 22–32)
Calcium: 7.4 mg/dL — ABNORMAL LOW (ref 8.9–10.3)
Chloride: 106 mmol/L (ref 98–111)
Creatinine, Ser: 4.26 mg/dL — ABNORMAL HIGH (ref 0.61–1.24)
GFR, Estimated: 15 mL/min — ABNORMAL LOW (ref >60)
Glucose, Bld: 240 mg/dL — ABNORMAL HIGH (ref 70–99)
Potassium: 3.2 mmol/L — ABNORMAL LOW (ref 3.5–5.1)
Sodium: 141 mmol/L (ref 135–145)

## 2022-02-04 LAB — GLUCOSE, CAPILLARY
Glucose-Capillary: 144 mg/dL — ABNORMAL HIGH (ref 70–99)
Glucose-Capillary: 166 mg/dL — ABNORMAL HIGH (ref 70–99)
Glucose-Capillary: 160 mg/dL — ABNORMAL HIGH (ref 70–99)
Glucose-Capillary: 207 mg/dL — ABNORMAL HIGH (ref 70–99)

## 2022-02-04 LAB — HEPATITIS B CORE ANTIBODY, TOTAL: Hep B Core Total Ab: NONREACTIVE

## 2022-02-04 LAB — HEPATITIS C ANTIBODY: HCV Ab: NONREACTIVE

## 2022-02-04 LAB — HEPATITIS B SURFACE ANTIBODY,QUALITATIVE: Hep B S Ab: REACTIVE — AB

## 2022-02-04 MED ORDER — CEFAZOLIN SODIUM-DEXTROSE 2-4 GM/100ML-% IV SOLN
2.0000 g | Freq: Two times a day (BID) | INTRAVENOUS | Status: AC
  Administered 2022-02-04: 2 g via INTRAVENOUS
  Filled 2022-02-04: qty 100

## 2022-02-04 MED ORDER — LIDOCAINE-PRILOCAINE 2.5-2.5 % EX CREA: TOPICAL_CREAM | CUTANEOUS | Status: DC | PRN

## 2022-02-04 MED ORDER — LIDOCAINE HCL (PF) 1 % IJ SOLN: 5.0000 mL | INTRAMUSCULAR | Status: DC | PRN

## 2022-02-04 MED ORDER — PENTAFLUOROPROP-TETRAFLUOROETH EX AERO: INHALATION_SPRAY | CUTANEOUS | Status: DC | PRN

## 2022-02-04 MED ORDER — ALTEPLASE 2 MG IJ SOLR: 2.0000 mg | Freq: Once | INTRAMUSCULAR | Status: DC | PRN

## 2022-02-04 MED ORDER — APIXABAN 2.5 MG PO TABS
2.5000 mg | ORAL_TABLET | Freq: Two times a day (BID) | ORAL | Status: DC
  Administered 2022-02-04 – 2022-02-06 (×6): 2.5 mg via ORAL
  Filled 2022-02-04 (×6): qty 1

## 2022-02-04 MED ORDER — HEPARIN SODIUM (PORCINE) 1000 UNIT/ML IJ SOLN
INTRAMUSCULAR | Status: AC
  Administered 2022-02-04: 10000 [IU]
  Filled 2022-02-04: qty 10

## 2022-02-04 MED ORDER — HEPARIN SODIUM (PORCINE) 1000 UNIT/ML DIALYSIS: 1000.0000 [IU] | INTRAMUSCULAR | Status: DC | PRN

## 2022-02-04 MED ORDER — MORPHINE SULFATE (PF) 2 MG/ML IV SOLN
INTRAVENOUS | Status: AC
  Filled 2022-02-04: qty 1

## 2022-02-04 NOTE — Progress Notes (Signed)
Progress Note   Patient: Micheal Aguirre ALP:[id number] DOB: Sep 05, 1955 DOA: 02/03/2022     1 DOS: the patient was seen and examined on 02/04/2022   Brief hospital course: Taken from H&P.  Micheal Aguirre is a 66 y.o. male with medical history significant of ESRD-HD (MWF), hypertension, hyperlipidemia, diabetes mellitus, GERD, dCHF, A-fib not on anticoagulants, prostate cancer, who presents with fall and right leg pain.    Pt accidentally fell forward at about 2:00 PM when he was sitting in chair, trying to pick up a rock and lost his balance.  He injured his right leg above knee area, causing pain, which is constant, sharp, severe, nonradiating, aggravated by movement.  No loss of consciousness.  He strongly denies any head or neck injury.  No headache or neck pain.  Refused CT scan of head and neck. Patient does not have chest pain, cough, shortness breath.  No nausea, vomiting, diarrhea or abdominal pain.  No symptoms of UTI.  Patient states that he had dialysis yesterday.   Of note, pt has asymmetric leg edema. He underwent ORIF of right lateral tibial plateau fracture on 11/27/2021. Right leg is worse than the left.  Patient had lower extremity venous Doppler for right leg which was negative for DVT on 01/26/2022.  Data Reviewed and ED Course: pt was found to have WBC 5.7, potassium 3.1, bicarbonate 29, creatinine 3.71, BUN 37, temperature normal, blood pressure 160/96, heart rate 71, RR 18, oxygen saturation 99% on room air.  Negative chest x-ray. X-ray showed mildly displaced acute right distal femur fracture.  Patient is admitted to telemetry bed as inpatient.  Dr. Roland Rack of Ortho and Dr. Candiss Norse of renal are consulted.   X-ray of pelvis: Unusual orientation of the bones of the pelvic ring. RIGHT pubic bone is more enface and RIGHT iliac shows a more open appearance than expected. Perhaps this is somehow related to projection. A repeat pelvic radiograph ensuring the patient is adequately positioned is  suggested. If there is high suspicion for pelvic injury a CT may be warranted in this instance.   EKG: I have personally reviewed.  Atrial fibrillation, QTc 432 T wave inversion in lead I/aVL and V2  6/16: Patient was taken to the OR by orthopedic surgery, s/p retrograde intramedullary nailing of closed displaced extra-articular supracondylar right femur fracture. Per orthopedic recommendation patient will be nonweightbearing to the right leg, right knee brace was placed which he can unlock and avoid to flexion up to 90 degree. Awaiting PT/OT evaluation.   Assessment and Plan: * Closed fracture of right distal femur [facility]) Patient was taken to the OR for retrograde intramedullary nail placement on 6/15. Tolerated the procedure well.  He will be nonweightbearing until recommended by orthopedic surgery.  Right knee brace was in place. Awaiting PT/OT evaluation for disposition. -Continue with pain management -Continue with supportive care  Fall at home, initial encounter -PT/OT when able to   ESRD on dialysis [facility]) Patient had schedule on MWF, no missed dialysis. -Continue dialysis per nephrology  Chronic diastolic CHF (congestive heart failure) ([place]) Okay patient has asymmetric leg edema, right leg is worse than the left.  Patient had negative lower extremity venous Doppler for DVT recently.  No shortness breath or chest pain.  No pulm edema chest x-ray.  CHF seems to be compensated.  2D echo on 05/31/2021 showed EF> 55%. -Volume management per renal by dialysis  HTN (hypertension) - IV hydralazine as needed -Amlodipine, Coreg -Patient is also on torsemide  HLD (hyperlipidemia) -  Lipitor  Type II diabetes mellitus with renal manifestations ([facility]) Recent A1c 5.6, well controlled.  Patient is taking NovoLog and Lantus 8 units daily -Sliding scale insulin -Glargine insulin 4 units daily  Atrial fibrillation, chronic ([facility]) Heart rate is 71.  -Continue Eliquis -Continue  Coreg  Hypokalemia Potassium is 3.1, magnesium at 2.1 -Will not replete potassium since patient is ESRD    Subjective: Patient was seen during dialysis today.  Having significant right leg pain and asking for pain meds.  Physical Exam: Vitals:   02/04/22 1245 02/04/22 1300 02/04/22 1303 02/04/22 1401  BP: (!) 161/113 (!) 158/141 (!) 167/98 (!) 143/88  Pulse: (!) 102 80 79 78  Resp: '15 15 15 16  '$ Temp:   99.8 F (37.7 C) 98.7 F (37.1 C)  TempSrc:   Oral Oral  SpO2: 100% 100% 100% 100%  Weight:   86.4 kg   Height:       General.  Well-developed gentleman, in no acute distress. Pulmonary.  Lungs clear bilaterally, normal respiratory effort. CV.  Regular rate and rhythm, no JVD, rub or murmur. Abdomen.  Soft, nontender, nondistended, BS positive. CNS.  Alert and oriented .  No focal neurologic deficit. Extremities. 2+ right LE edema, no cyanosis, pulses intact and symmetrical.  Right leg with knee brace and Ace wrap. Psychiatry.  Judgment and insight appears normal.  Data Reviewed: Prior notes, labs and images reviewed  Family Communication: Discussed with patient  Disposition: Status is: Inpatient Remains inpatient appropriate because: Severity of illness   Planned Discharge Destination: To be determined  DVT prophylaxis.  Eliquis Time spent: 50 minutes  This record has been created using Systems analyst. Errors have been sought and corrected,but may not always be located. Such creation errors do not reflect on the standard of care.  Author: Lorella Nimrod, MD 02/04/2022 2:45 PM  For on call review [email or web address].

## 2022-02-04 NOTE — Anesthesia Postprocedure Evaluation (Signed)
Anesthesia Post Note  Patient: Micheal Aguirre  Procedure(s) Performed: OPEN REDUCTION INTERNAL FIXATION (ORIF) DISTAL FEMUR FRACTURE (Right)  Patient location during evaluation: PACU Anesthesia Type: General Level of consciousness: awake and alert Pain management: pain level controlled Vital Signs Assessment: post-procedure vital signs reviewed and stable Respiratory status: spontaneous breathing, nonlabored ventilation and respiratory function stable Cardiovascular status: blood pressure returned to baseline and stable Postop Assessment: no apparent nausea or vomiting Anesthetic complications: no   No notable events documented.   Last Vitals:  Vitals:   02/03/22 2223 02/03/22 2346  BP: (!) 157/111 (!) 151/100  Pulse: 78 66  Resp: 15 17  Temp: 36.9 C 36.9 C  SpO2: (!) 87% 99%    Last Pain:  Vitals:   02/03/22 2200  TempSrc:   PainSc: 0-No pain                 [place] Ouch

## 2022-02-04 NOTE — Hospital Course (Signed)
Taken from H&P.  Micheal Aguirre is a 66 y.o. male with medical history significant of ESRD-HD (MWF), hypertension, hyperlipidemia, diabetes mellitus, GERD, dCHF, A-fib not on anticoagulants, prostate cancer, who presents with fall and right leg pain.    Pt accidentally fell forward at about 2:00 PM when he was sitting in chair, trying to pick up a rock and lost his balance.  He injured his right leg above knee area, causing pain, which is constant, sharp, severe, nonradiating, aggravated by movement.  No loss of consciousness.  He strongly denies any head or neck injury.  No headache or neck pain.  Refused CT scan of head and neck. Patient does not have chest pain, cough, shortness breath.  No nausea, vomiting, diarrhea or abdominal pain.  No symptoms of UTI.  Patient states that he had dialysis yesterday.   Of note, pt has asymmetric leg edema. He underwent ORIF of right lateral tibial plateau fracture on 11/27/2021. Right leg is worse than the left.  Patient had lower extremity venous Doppler for right leg which was negative for DVT on 01/26/2022.  Data Reviewed and ED Course: pt was found to have WBC 5.7, potassium 3.1, bicarbonate 29, creatinine 3.71, BUN 37, temperature normal, blood pressure 160/96, heart rate 71, RR 18, oxygen saturation 99% on room air.  Negative chest x-ray. X-ray showed mildly displaced acute right distal femur fracture.  Patient is admitted to telemetry bed as inpatient.  Dr. Roland Rack of Ortho and Dr. Candiss Norse of renal are consulted.   X-ray of pelvis: Unusual orientation of the bones of the pelvic ring. RIGHT pubic bone is more enface and RIGHT iliac shows a more open appearance than expected. Perhaps this is somehow related to projection. A repeat pelvic radiograph ensuring the patient is adequately positioned is suggested. If there is high suspicion for pelvic injury a CT may be warranted in this instance.   EKG: I have personally reviewed.  Atrial fibrillation, QTc 432 T wave  inversion in lead I/aVL and V2  6/16: Patient was taken to the OR by orthopedic surgery, s/p retrograde intramedullary nailing of closed displaced extra-articular supracondylar right femur fracture. Per orthopedic recommendation patient will be nonweightbearing to the right leg, right knee brace was placed which he can unlock and avoid to flexion up to 90 degree. Awaiting PT/OT evaluation.  6/17: Labs with hemoglobin decreased to 9.8 after the surgery.  Started on supplement.  PT recommending SNF.  6/18: No acute overnight.  Further decrease of hemoglobin to 8.9.  Continuing iron supplement and monitoring.  Awaiting SNF placement.  6/19: Patient remained stable.  Had his dialysis today.  Hemoglobin improved to 9.8.  He will continue his iron supplement and current medications and follow-up with his providers for further recommendations.

## 2022-02-04 NOTE — Evaluation (Signed)
Physical Therapy Evaluation Patient Details Name: Micheal Aguirre MRN: [id number] DOB: 09-04-1955 Today's Date: 02/04/2022  History of Present Illness  66 y.o. male with medical history significant for ESRD on HD MWF, hypertension, type 2 diabetes, hyperlipidemia, coronary artery disease status post CABG, HFrEF 35%, duodenal ulcers, history of GI bleed, prostate cancer, with metastasis to bone, left renal mass, R tibial plateau fracture s/p ORIF (11/27/21) who presented to [facility] ED via EMS after a fall with acute onset of R LE pain; admitted for management of R distal femur fracture, s/p ORIF (02/03/22), NWB  Clinical Impression  Patient semi-long-siting in bed upon arrival to room. Alert and oriented to self, general location; unaware of reason for admission and recent surgery (reoriented during session).  Agreeable to some participation with session, but requires consistent cuing/encouragement.  Demonstrates significant guarding/grimacing with any movement of R LE; meds received prior to session and unavailable at this time.  Requires act assist for movement of R hip and knee, tolerating R knee flexion to only approx 90 degrees (brace remained on throughout session).  Very limited/no R ankle DF and R toe flex/extension; will continue to monitor in subsequent sessions.  Currently requiring min/mod assist for bed mobility; close sup for unsupported sitting balance; min assist for lateral scooting towards R (dep assist for position/management of R LE).  Unsafe/unable to attempt standing this PM due to pain; do anticipate need for +2 and significant difficulty maintaining NWB R LE.  Hopeful for OOB to chair via lateral scoot next session. Would benefit from skilled PT to address above deficits and promote optimal return to PLOF.; recommend transition to STR upon discharge from acute hospitalization.      Recommendations for follow up therapy are one component of a multi-disciplinary discharge planning process, led  by the attending physician.  Recommendations may be updated based on patient status, additional functional criteria and insurance authorization.  Follow Up Recommendations Skilled nursing-short term rehab (<3 hours/day)    Assistance Recommended at Discharge Frequent or constant Supervision/Assistance  Patient can return home with the following  Two people to help with walking and/or transfers;Two people to help with bathing/dressing/bathroom    Equipment Recommendations    Recommendations for Other Services       Functional Status Assessment Patient has had a recent decline in their functional status and demonstrates the ability to make significant improvements in function in a reasonable and predictable amount of time.     Precautions / Restrictions Precautions Precautions: Fall Required Braces or Orthoses: Knee Immobilizer - Right Knee Immobilizer - Right: Other (comment) (ok for unlocking and ROM to 90 degrees with PT) Restrictions Weight Bearing Restrictions: Yes RLE Weight Bearing: Non weight bearing      Mobility  Bed Mobility Overal bed mobility: Needs Assistance Bed Mobility: Supine to Sit, Sit to Supine     Supine to sit: Mod assist, Min assist Sit to supine: Min assist, Mod assist   General bed mobility comments: total assist to support and guide R LE    Transfers                   General transfer comment: patient refused/deferred due to pain    Ambulation/Gait               General Gait Details: patient refused/deferred due to pain; anticipate significant difficulty with NWB R LE  Stairs            Wheelchair Mobility    Modified Rankin (Stroke  Patients Only)       Balance Overall balance assessment: Needs assistance Sitting-balance support: No upper extremity supported, Feet supported Sitting balance-Leahy Scale: Fair                                       Pertinent Vitals/Pain Pain Assessment Pain  Assessment: Faces Faces Pain Scale: Hurts even more Pain Location: R knee/LE Pain Descriptors / Indicators: Aching, Guarding, Grimacing Pain Intervention(s): Limited activity within patient's tolerance, Monitored during session, Repositioned, Premedicated before session    Kendrick expects to be discharged to:: Skilled nursing facility                   Additional Comments: Per previous notes, at baseline, pt lives in an assistive living facility [facility]  Patient slightly confused this date; unreliable historian    Prior Function Prior Level of Function : Independent/Modified Independent             Mobility Comments: Per previous notes, patient with at least one other recent fall (April, 2023) resulting in R lateral tibial plateau fracture       Hand Dominance        Extremity/Trunk Assessment   Upper Extremity Assessment Upper Extremity Assessment: Overall WFL for tasks assessed    Lower Extremity Assessment Lower Extremity Assessment: Generalized weakness (R LE with hinged-knee brace, tolerating flexion to only 15-20 degrees (limited by pain); significant edema to LE; absent R ankle DF, R toes flex/ext--RN informed/aware)       Communication   Communication: No difficulties  Cognition Arousal/Alertness: Awake/alert Behavior During Therapy: Anxious Overall Cognitive Status: No family/caregiver present to determine baseline cognitive functioning                                 General Comments: Alert and oriented to self, location; unaware of recent R LE fracture/surgery (reoriented during session)        General Comments      Exercises Other Exercises Other Exercises: Lateral scooting edge of bed (towards L), min assist; total assist to support R LE.  Requires extensive encouragement, but able to initiate lift off and small lateral scoots without physical lift assist.  Will plan for OOB to chair (via lateral scoot)  next session as appropriate. Other Exercises: R LE supine therex, 1x10, act assist: ankle pumps, quad sets, hip abduct/adduct, heel slides (tolerating to approx 20 degrees).  Absent active R ankle DF and toe flex/ext noted; RN informed/aware.   Assessment/Plan    PT Assessment Patient needs continued PT services  PT Problem List Decreased strength;Decreased range of motion;Decreased balance;Decreased activity tolerance;Decreased mobility;Decreased coordination;Decreased cognition;Decreased knowledge of use of DME;Decreased safety awareness;Decreased knowledge of precautions;Pain       PT Treatment Interventions DME instruction;Gait training;Functional mobility training;Therapeutic activities;Balance training;Therapeutic exercise;Cognitive remediation;Patient/family education    PT Goals (Current goals can be found in the Care Plan section)  Acute Rehab PT Goals Patient Stated Goal: to make the pain better PT Goal Formulation: With patient Time For Goal Achievement: 02/18/22 Potential to Achieve Goals: Fair    Frequency 7X/week     Co-evaluation               AM-PAC PT "6 Clicks" Mobility  Outcome Measure Help needed turning from your back to your side while in a flat bed without  using bedrails?: A Little Help needed moving from lying on your back to sitting on the side of a flat bed without using bedrails?: A Lot Help needed moving to and from a bed to a chair (including a wheelchair)?: Total Help needed standing up from a chair using your arms (e.g., wheelchair or bedside chair)?: Total Help needed to walk in hospital room?: Total Help needed climbing 3-5 steps with a railing? : Total 6 Click Score: 9    End of Session   Activity Tolerance: Patient limited by pain Patient left: in bed;with bed alarm set;with call bell/phone within reach Nurse Communication: Mobility status PT Visit Diagnosis: Muscle weakness (generalized) (M62.81);Difficulty in walking, not elsewhere  classified (R26.2)    Time: 1540-1611 PT Time Calculation (min) (ACUTE ONLY): 31 min   Charges:   PT Evaluation $PT Eval Moderate Complexity: 1 Mod PT Treatments $Therapeutic Activity: 8-22 mins       Kabrina Christiano H. Owens Shark, PT, DPT, NCS 02/04/22, 4:58 PM [phone]

## 2022-02-04 NOTE — Progress Notes (Signed)
PT Cancellation Note  Patient Details Name: Micheal Aguirre MRN: [id number] DOB: 1956/01/07   Cancelled Treatment:    Reason Eval/Treat Not Completed: Patient at procedure or test/unavailable (Consult received and chart reviewed. Patient preparing to leave unit for dialysis. Will re-attempt at later time/date as medically appropriate and available.)   Signa Cheek H. Owens Shark, PT, DPT, NCS 02/04/22, 9:09 AM [phone]

## 2022-02-04 NOTE — Progress Notes (Signed)
  Subjective: 1 Day Post-Op Procedure(s) (LRB): OPEN REDUCTION INTERNAL FIXATION (ORIF) DISTAL FEMUR FRACTURE (Right) Patient reports pain as mild.   Patient is well, and has had no acute complaints or problems PT and care management to assist with discharge planning. Negative for chest pain and shortness of breath Fever: no Gastrointestinal:Negative for nausea and vomiting Patient states that he is passing gas without pain this morning.  Objective: Vital signs in last 24 hours: Temp:  [96.8 F (36 C)-98.7 F (37.1 C)] 98.2 F (36.8 C) (06/16 0312) Pulse Rate:  [49-105] 92 (06/16 0312) Resp:  [11-19] 15 (06/16 0312) BP: (106-180)/(76-116) 155/105 (06/16 0312) SpO2:  [87 %-100 %] 97 % (06/16 0312) Weight:  [81.6 kg] 81.6 kg (06/15 1719)  Intake/Output from previous day:  Intake/Output Summary (Last 24 hours) at 02/04/2022 0801 Last data filed at 02/03/2022 2311 Gross per 24 hour  Intake 550 ml  Output 200 ml  Net 350 ml    Intake/Output this shift: No intake/output data recorded.  Labs: Recent Labs    02/03/22 1207  HGB 12.2*   Recent Labs    02/03/22 1207  WBC 5.7  RBC 3.90*  HCT 39.4  PLT 125*   Recent Labs    02/03/22 1209 02/04/22 0342  NA 142 141  K 3.1* 3.2*  CL 105 106  CO2 29 23  BUN 37* 51*  CREATININE 3.71* 4.26*  GLUCOSE 106* 240*  CALCIUM 8.4* 7.4*   Recent Labs    02/03/22 1600  INR 1.2   EXAM General - Patient is Alert and Appropriate Extremity - ABD soft Neurovascular intact Dorsiflexion/Plantar flexion intact Incision: dressing C/D/I No cellulitis present Dressing/Incision - clean, dry, no drainage to the ACE wraps applied to the right leg. Motor Function - intact, moving foot and toes well on exam.  Abdomen soft with intact bowel sounds.  Past Medical History:  Diagnosis Date   Diabetes mellitus without complication (HCC)    ESRD (end stage renal disease) ([place])    Hypercholesteremia    Hypertension    Renal disorder      Assessment/Plan: 1 Day Post-Op Procedure(s) (LRB): OPEN REDUCTION INTERNAL FIXATION (ORIF) DISTAL FEMUR FRACTURE (Right) Principal Problem:   Closed fracture of right distal femur (HCC) Active Problems:   ESRD on dialysis ([place])   Chronic diastolic CHF (congestive heart failure) (HCC)   Type II diabetes mellitus with renal manifestations (HCC)   HTN (hypertension)   HLD (hyperlipidemia)   Atrial fibrillation, chronic ([place])   Fall at home, initial encounter   Hypokalemia  Estimated body mass index is 26.57 kg/m as calculated from the following:   Height as of this encounter: '5\' 9"'$  (1.753 m).   Weight as of this encounter: 81.6 kg. Advance diet Up with therapy D/C IV fluids when tolerating po intake.  Labs reviewed this morning, K+ 3.2, will supplement. Up with therapy today, PT order placed for the patient. Non-weightbearing to the right leg, can unlock the knee brace and work to flexion up to 90 degrees. May need SNF pending progress with PT.  DVT Prophylaxis -  Eliquis Non-weightbearing to the right leg.  Micheal Daphanie Oquendo, PA-C [facility] 02/04/2022, 8:01 AM

## 2022-02-04 NOTE — Progress Notes (Signed)
Hemodialysis Post Treatment Note  04 February 2022  Access:  Right Subclavian Catheter  UF Removed:   BFR: 0 ML    400  Next Scheduled Treatment: 02/07/2022  Note: Patient presents for scheduled hemodialysis treatment with intact CVC, no signs of infection, functioning well, maintained prescribed BFR. Patient states that pain is manageable at 2/10 at initiation of treatment but increased to 10/10 at which he is medicated with PRN dose of Morphine with some relief. Dressing change completed to CVC.  No UF targeted for this treatment. Patient's next scheduled treatment is 02/07/22 following his outpatient schedule. No further concerns, report given to primary nurse, patient transported to assigned room.

## 2022-02-04 NOTE — Progress Notes (Signed)
[facility]  ROUNDING NOTE   Subjective:   Micheal Aguirre is a 66 male with past medical history of hypertension, diabetes, GERD, atrial fib, and end stage renal disease on hemodialysis. Patient presents to the ED after a fall and right leg pain. Patient was admitted for Closed left hip fracture ([place]) [S72.002A] Closed fracture of distal end of right femur, unspecified fracture morphology, initial encounter [facility]) [S72.401A]  Patient is known to our practice from previous admission. He receives outpatient dialysis treatments at [facility] on a MWF schedule. He received a full treatment on Wednesday. Patient is a poor historian and chart review used to obtain history. It reveals patient was sitting in a chair, reaching to pick up a rock and lost his balance. He reported pain above the knee area. On ED arrival, patient is found to have a comminuted fracture of the distal shaft of his femur. Orthopedics consulted.  Labs significant for potassium 3.1, glucose 106, creatinine 3.71 and GFR 17.   We have been consulted to manage dialysis needs during this admission.  Patient seen and evaluated during dialysis   HEMODIALYSIS FLOWSHEET:  Blood Flow Rate (mL/min): 400 mL/min Arterial Pressure (mmHg): -180 mmHg Venous Pressure (mmHg): 160 mmHg Transmembrane Pressure (mmHg): 70 mmHg Ultrafiltration Rate (mL/min): 170 mL/min Dialysate Flow Rate (mL/min): 500 ml/min Conductivity: 13.6 Conductivity: 13.6 Dialysis Fluid Bolus: Normal Saline Bolus Amount (mL): 250 mL  Alert with pleasant confusion. Unable to tell me why he was sent to hospital. "I was at [facility] and they sent me here." No complaints of pain at this time.   Objective:  Vital signs in last 24 hours:  Temp:  [96.8 F (36 C)-99.8 F (37.7 C)] 98.7 F (37.1 C) (06/16 1401) Pulse Rate:  [38-153] 78 (06/16 1401) Resp:  [10-22] 16 (06/16 1401) BP: (106-169)/(76-141) 143/88 (06/16 1401) SpO2:  [87 %-100 %] 100 % (06/16  1401) Weight:  [81.6 kg-88.3 kg] 86.4 kg (06/16 1303)  Weight change:  Filed Weights   02/03/22 1719 02/04/22 0957 02/04/22 1303  Weight: 81.6 kg 88.3 kg 86.4 kg    Intake/Output: I/O last 3 completed shifts: In: 550 [I.V.:500; IV Piggyback:50] Out: 200 [Urine:100; Blood:100]   Intake/Output this shift:  Total I/O In: 120 [P.O.:120] Out: 0   Physical Exam: General: NAD, resting comfortably  Head: Normocephalic, atraumatic. Moist oral mucosal membranes  Eyes: Anicteric  Lungs:  Clear to auscultation, normal effort  Heart: Regular rate and rhythm  Abdomen:  Soft, nontender  Extremities:  trace peripheral edema.  Neurologic: Nonfocal, moving all four extremities  Skin: No lesions  Access: Rt Permcath    Basic Metabolic Panel: Recent Labs  Lab 02/03/22 1209 02/03/22 1300 02/04/22 0342  NA 142  --  141  K 3.1*  --  3.2*  CL 105  --  106  CO2 29  --  23  GLUCOSE 106*  --  240*  BUN 37*  --  51*  CREATININE 3.71*  --  4.26*  CALCIUM 8.4*  --  7.4*  MG  --  2.1  --     Liver Function Tests: Recent Labs  Lab 02/03/22 1209  AST 25  ALT 28  ALKPHOS 117  BILITOT 0.7  PROT 6.9  ALBUMIN 3.7   No results for input(s): "LIPASE", "AMYLASE" in the last 168 hours. No results for input(s): "AMMONIA" in the last 168 hours.  CBC: Recent Labs  Lab 02/03/22 1207  WBC 5.7  NEUTROABS 4.8  HGB 12.2*  HCT  39.4  MCV 101.0*  PLT 125*    Cardiac Enzymes: No results for input(s): "CKTOTAL", "CKMB", "CKMBINDEX", "TROPONINI" in the last 168 hours.  BNP: Invalid input(s): "POCBNP"  CBG: Recent Labs  Lab 02/03/22 1656 02/03/22 2114 02/03/22 2241 02/04/22 0811 02/04/22 1403  GLUCAP 101* 121* 155* 144* 166*    Microbiology: Results for orders placed or performed during the hospital encounter of 11/26/21  Resp Panel by RT-PCR (Flu A&B, Covid) Nasopharyngeal Swab     Status: None   Collection Time: 11/26/21  6:07 PM   Specimen: Nasopharyngeal Swab;  Nasopharyngeal(NP) swabs in vial transport medium  Result Value Ref Range Status   SARS Coronavirus 2 by RT PCR NEGATIVE NEGATIVE Final    Comment: (NOTE) SARS-CoV-2 target nucleic acids are NOT DETECTED.  The SARS-CoV-2 RNA is generally detectable in upper respiratory specimens during the acute phase of infection. The lowest concentration of SARS-CoV-2 viral copies this assay can detect is 138 copies/mL. A negative result does not preclude SARS-Cov-2 infection and should not be used as the sole basis for treatment or other patient management decisions. A negative result may occur with  improper specimen collection/handling, submission of specimen other than nasopharyngeal swab, presence of viral mutation(s) within the areas targeted by this assay, and inadequate number of viral copies(<138 copies/mL). A negative result must be combined with clinical observations, patient history, and epidemiological information. The expected result is Negative.  Fact Sheet for Patients:  EntrepreneurPulse.com.au  Fact Sheet for Healthcare Providers:  IncredibleEmployment.be  This test is no t yet approved or cleared by the [place] FDA and  has been authorized for detection and/or diagnosis of SARS-CoV-2 by FDA under an Emergency Use Authorization (EUA). This EUA will remain  in effect (meaning this test can be used) for the duration of the COVID-19 declaration under Section 564(b)(1) of the Act, 21 U.S.C.section 360bbb-3(b)(1), unless the authorization is terminated  or revoked sooner.       Influenza A by PCR NEGATIVE NEGATIVE Final   Influenza B by PCR NEGATIVE NEGATIVE Final    Comment: (NOTE) The Xpert Xpress SARS-CoV-2/FLU/RSV plus assay is intended as an aid in the diagnosis of influenza from Nasopharyngeal swab specimens and should not be used as a sole basis for treatment. Nasal washings and aspirates are unacceptable for Xpert Xpress  SARS-CoV-2/FLU/RSV testing.  Fact Sheet for Patients: EntrepreneurPulse.com.au  Fact Sheet for Healthcare Providers: IncredibleEmployment.be  This test is not yet approved or cleared by the [place] FDA and has been authorized for detection and/or diagnosis of SARS-CoV-2 by FDA under an Emergency Use Authorization (EUA). This EUA will remain in effect (meaning this test can be used) for the duration of the COVID-19 declaration under Section 564(b)(1) of the Act, 21 U.S.C. section 360bbb-3(b)(1), unless the authorization is terminated or revoked.  Performed at [facility], [address] [place], [place] [address]   Surgical PCR screen     Status: None   Collection Time: 11/27/21  6:49 AM   Specimen: Nasal Mucosa; Nasal Swab  Result Value Ref Range Status   MRSA, PCR NEGATIVE NEGATIVE Final   Staphylococcus aureus NEGATIVE NEGATIVE Final    Comment: (NOTE) The Xpert SA Assay (FDA approved for NASAL specimens in patients 62 years of age and older), is one component of a comprehensive surveillance program. It is not intended to diagnose infection nor to guide or monitor treatment. Performed at [facility], [address], [place], [place] [address]     Coagulation  Studies: Recent Labs    02/03/22 1600  LABPROT 15.2  INR 1.2    Urinalysis: No results for input(s): "COLORURINE", "LABSPEC", "PHURINE", "GLUCOSEU", "HGBUR", "BILIRUBINUR", "KETONESUR", "PROTEINUR", "UROBILINOGEN", "NITRITE", "LEUKOCYTESUR" in the last 72 hours.  Invalid input(s): "APPERANCEUR"    Imaging: DG FEMUR, MIN 2 VIEWS RIGHT  Result Date: 02/03/2022 CLINICAL DATA:  Fracture of the right femur. EXAM: RIGHT FEMUR 2 VIEWS COMPARISON:  Earlier radiograph dated 02/03/2022. FINDINGS: Four intraoperative fluoroscopic spot images provided. The total fluoroscopic time is 1 minute 46 seconds. Status post ORIF of femoral fracture. IMPRESSION:  Status post ORIF of the femoral fracture. Electronically Signed   By: Anner Crete M.D.   On: 02/03/2022 21:17   DG C-Arm 1-60 Min-No Report  Result Date: 02/03/2022 Fluoroscopy was utilized by the requesting physician.  No radiographic interpretation.   DG C-Arm 1-60 Min-No Report  Result Date: 02/03/2022 Fluoroscopy was utilized by the requesting physician.  No radiographic interpretation.   DG C-Arm 1-60 Min-No Report  Result Date: 02/03/2022 Fluoroscopy was utilized by the requesting physician.  No radiographic interpretation.   [place] OR NERVE BLOCK-IMAGE ONLY [facility])  Result Date: 02/03/2022 There is no interpretation for this exam.  This order is for images obtained during a surgical procedure.  Please See "Surgeries" Tab for more information regarding the procedure.   DG FEMUR PORT MIN 2 VIEWS LEFT  Addendum Date: 02/03/2022   ADDENDUM REPORT: 02/03/2022 13:31 ADDENDUM: Images and report applied to the RIGHT femur. There is a comminuted, overriding and displaced fracture of the RIGHT femoral metadiaphysis rather than the LEFT. Electronically Signed   By: Zetta Bills M.D.   On: 02/03/2022 13:31   Result Date: 02/03/2022 CLINICAL DATA:  Knee pain following fall, tibial plateau surgery 10 weeks ago. EXAM: LEFT FEMUR PORTABLE 2 VIEWS COMPARISON:  Pelvic evaluation of the same date. FINDINGS: Mildly comminuted fracture with moderate displacement and over riding of the distal LEFT femoral metadiaphysis. There is posterior and medial displacement of the distal fracture fragment by up to 1 cm with approximately 1-2 cm overlap/over riding. Signs of prior ORIF of the RIGHT tibial plateau are demonstrated. No additional fractures are identified. IMPRESSION: Mildly comminuted, displaced and over riding fracture of the distal LEFT femoral metadiaphysis as described. No additional fractures are seen. Electronically Signed: By: Zetta Bills M.D. On: 02/03/2022 12:24   DG FEMUR, MIN 2 VIEWS  RIGHT  Result Date: 02/03/2022 CLINICAL DATA:  Right knee pain after a fall today. EXAM: RIGHT FEMUR 2 VIEWS COMPARISON:  Right knee radiographs 11/27/2021 FINDINGS: There is an acute, oblique, mildly comminuted fracture of the distal shaft of the femur which demonstrates mild medial and posterior displacement. The knee and hip are located. Prior ORIF of the proximal tibia and a remote posttraumatic deformity of the proximal fibula are again noted. Soft tissue swelling and atherosclerotic vascular calcifications are noted. IMPRESSION: Mildly displaced acute distal femur fracture. Electronically Signed   By: Logan Bores M.D.   On: 02/03/2022 13:21   DG Pelvis 1-2 Views  Result Date: 02/03/2022 CLINICAL DATA:  A 66 year old male presents for evaluation of hip pain and knee pain following fall. EXAM: PELVIS - 1-2 VIEW COMPARISON:  Femoral evaluation of the same date. FINDINGS: Osteopenia. Images mildly rotated. The orientation of the iliac bones is disparate with that of the pubic bones accounting for rotation, it is unclear whether this is projectional. Based on orientation of the pubic bone on the RIGHT in the iliac bone on the RIGHT  it would be difficult to exclude the possibility of pelvic ring injury. Recommend attempt at repeat imaging or CT for further assessment. . IMPRESSION: Unusual orientation of the bones of the pelvic ring. RIGHT pubic bone is more enface and RIGHT iliac shows a more open appearance than expected. Perhaps this is somehow related to projection. A repeat pelvic radiograph ensuring the patient is adequately positioned is suggested. If there is high suspicion for pelvic injury a CT may be warranted in this instance. Electronically Signed   By: Zetta Bills M.D.   On: 02/03/2022 12:39   DG Chest Portable 1 View  Result Date: 02/03/2022 CLINICAL DATA:  RIGHT knee pain following fall. EXAM: PORTABLE CHEST 1 VIEW COMPARISON:  None available FINDINGS: Dialysis catheter in situ tip at  the upper portion of the RIGHT atrium. Post median sternotomy for CABG. Cardiomediastinal contours and hilar structures are normal accounting for low depth of expansion with basilar atelectasis. No sign of consolidation. No pneumothorax. On limited assessment no acute skeletal findings. IMPRESSION: 1. Signs of basilar atelectasis without acute cardiopulmonary disease. 2. Dialysis catheter in situ tip at the upper portion of the RIGHT atrium. Electronically Signed   By: Zetta Bills M.D.   On: 02/03/2022 12:30     Medications:    sodium chloride 50 mL/hr at 02/03/22 2311    ceFAZolin (ANCEF) IV      acetaminophen  1,000 mg Oral Q6H   amLODipine  5 mg Oral Daily   apixaban  2.5 mg Oral BID   aspirin EC  81 mg Oral BID   atorvastatin  80 mg Oral QHS   bisacodyl  10 mg Oral Daily   calcium carbonate  500 mg Oral BID WC   carvedilol  25 mg Oral BID WC   Chlorhexidine Gluconate Cloth  6 each Topical Q0600   docusate sodium  100 mg Oral BID   folic acid  1 mg Oral Daily   gabapentin  100 mg Oral QHS   insulin aspart  0-5 Units Subcutaneous QHS   insulin aspart  0-9 Units Subcutaneous TID WC   insulin glargine-yfgn  4 Units Subcutaneous QHS   melatonin  5 mg Oral QHS   morphine (PF)       multivitamin  1 tablet Oral QHS   pantoprazole  40 mg Oral Q12H   polyethylene glycol  17 g Oral QHS   predniSONE  10 mg Oral Q breakfast   senna-docusate  2 tablet Oral BID   simethicone  80 mg Oral Q6H   tamsulosin  0.4 mg Oral QHS   torsemide  20 mg Oral BID   vitamin B-12  500 mcg Oral q AM   [START ON 02/05/2022] Vitamin D (Ergocalciferol)  50,000 Units Oral Q Sat   acetaminophen, bisacodyl, diphenhydrAMINE, hydrALAZINE, magnesium hydroxide, methocarbamol, metoCLOPramide **OR** metoCLOPramide (REGLAN) injection, morphine injection, morphine (PF), ondansetron **OR** ondansetron (ZOFRAN) IV, oxybutynin, oxyCODONE, senna-docusate, sodium phosphate  Assessment/ Plan:  Mr. Audley Hinojos is a 66 y.o.   male with past medical history of hypertension, diabetes, GERD, atrial fib, and end stage renal disease on hemodialysis. Patient presents to the ED after a fall and right leg pain. Patient was admitted for Closed left hip fracture ([place]) [S72.002A] Closed fracture of distal end of right femur, unspecified fracture morphology, initial encounter ([place]) [S72.401A]  [facility] [place]/MWF/Rt Permcath  End stage renal disease with hypokalemia on hemo dialysis. Will maintain outpatient schedule if possible.   Receiving scheduled dialysis treatment, UF goal 1  L as tolerated.  Next treatment scheduled for Monday.  Potassium will correct with dialysis.  2. Anemia of chronic kidney disease Lab Results  Component Value Date   HGB 12.2 (L) 02/03/2022    Hemoglobin remains within acceptable range.  We will continue to monitor.  3. Secondary Hyperparathyroidism:  Lab Results  Component Value Date   CALCIUM 7.4 (L) 02/04/2022   PHOS 2.9 12/02/2021    Calcium not within acceptable range.  Continue calcium carbonate and vitamin D supplementation.  4. Diabetes mellitus type II with chronic kidney disease  insulin dependent. Home regimen includes Lantus and NovoLog. Most recent hemoglobin A1c is 5.6 on 11/27/21.   5.  Closed fracture of the right distal femur.  ORIF completed on 6/15.  Ortho continues to follow.  Pain management, PT/OT.   LOS: 1   6/16/20233:11 PM

## 2022-02-05 DIAGNOSIS — I482 Chronic atrial fibrillation, unspecified: Secondary | ICD-10-CM | POA: Diagnosis not present

## 2022-02-05 DIAGNOSIS — I5032 Chronic diastolic (congestive) heart failure: Secondary | ICD-10-CM | POA: Diagnosis not present

## 2022-02-05 DIAGNOSIS — S72401A Unspecified fracture of lower end of right femur, initial encounter for closed fracture: Secondary | ICD-10-CM | POA: Diagnosis not present

## 2022-02-05 LAB — BASIC METABOLIC PANEL
Anion gap: 10 (ref 5–15)
BUN: 37 mg/dL — ABNORMAL HIGH (ref 8–23)
CO2: 26 mmol/L (ref 22–32)
Calcium: 7.7 mg/dL — ABNORMAL LOW (ref 8.9–10.3)
Chloride: 99 mmol/L (ref 98–111)
Creatinine, Ser: 3.62 mg/dL — ABNORMAL HIGH (ref 0.61–1.24)
GFR, Estimated: 18 mL/min — ABNORMAL LOW (ref >60)
Glucose, Bld: 102 mg/dL — ABNORMAL HIGH (ref 70–99)
Potassium: 3.6 mmol/L (ref 3.5–5.1)
Sodium: 135 mmol/L (ref 135–145)

## 2022-02-05 LAB — GLUCOSE, CAPILLARY
Glucose-Capillary: 104 mg/dL — ABNORMAL HIGH (ref 70–99)
Glucose-Capillary: 169 mg/dL — ABNORMAL HIGH (ref 70–99)
Glucose-Capillary: 150 mg/dL — ABNORMAL HIGH (ref 70–99)
Glucose-Capillary: 165 mg/dL — ABNORMAL HIGH (ref 70–99)

## 2022-02-05 LAB — CBC
HCT: 31.5 % — ABNORMAL LOW (ref 39.0–52.0)
Hemoglobin: 9.8 g/dL — ABNORMAL LOW (ref 13.0–17.0)
MCH: 30.4 pg (ref 26.0–34.0)
MCHC: 31.1 g/dL (ref 30.0–36.0)
MCV: 97.8 fL (ref 80.0–100.0)
Platelets: 105 10*3/uL — ABNORMAL LOW (ref 150–400)
RBC: 3.22 MIL/uL — ABNORMAL LOW (ref 4.22–5.81)
RDW: 16.7 % — ABNORMAL HIGH (ref 11.5–15.5)
WBC: 7.9 10*3/uL (ref 4.0–10.5)
nRBC: 0 % (ref 0.0–0.2)

## 2022-02-05 MED ORDER — APIXABAN 2.5 MG PO TABS
2.5000 mg | ORAL_TABLET | Freq: Two times a day (BID) | ORAL | 0 refills | Status: DC
Start: 2022-02-05 — End: 2023-05-05

## 2022-02-05 MED ORDER — FE FUMARATE-B12-VIT C-FA-IFC PO CAPS: 1.0000 | ORAL_CAPSULE | Freq: Two times a day (BID) | ORAL | Status: DC

## 2022-02-05 MED ORDER — OXYCODONE HCL 5 MG PO TABS: 5.0000 mg | ORAL_TABLET | ORAL | 0 refills | Status: DC | PRN

## 2022-02-05 MED ORDER — FE FUMARATE-B12-VIT C-FA-IFC PO CAPS
1.0000 | ORAL_CAPSULE | Freq: Two times a day (BID) | ORAL | Status: DC
  Administered 2022-02-06 (×2): 1 via ORAL
  Filled 2022-02-05 (×5): qty 1

## 2022-02-05 NOTE — NC FL2 (Signed)
[place] LEVEL OF CARE SCREENING TOOL     IDENTIFICATION  Patient Name: Micheal Aguirre Birthdate: Mar 20, 1956 Sex: male Admission Date (Current Location): 02/03/2022  [facility] and [place] Number:  Engineering geologist and Address:  [facility], [address], [place], [place] [address]      Provider Number: [id number]  Attending Physician Name and Address:  Lorella Nimrod, MD  Relative Name and Phone Number:  Antoin, Dargis (Daughter)   [phone] [facility])    Current Level of Care: Hospital Recommended Level of Care: [place] Prior Approval Number:    Date Approved/Denied:   PASRR Number: [id number]  Discharge Plan:      Current Diagnoses: Patient Active Problem List   Diagnosis Date Noted   Type II diabetes mellitus with renal manifestations ([place]) 02/03/2022   HTN (hypertension) 02/03/2022   HLD (hyperlipidemia) 02/03/2022   Atrial fibrillation, chronic ([place]) 02/03/2022   Fall at home, initial encounter 02/03/2022   Hypokalemia 02/03/2022   Closed fracture of right distal femur ([place]) 02/03/2022   Chronic diastolic CHF (congestive heart failure) ([place]) 11/30/2021   DMII (diabetes mellitus, type 2) ([place]) 11/30/2021   Prostate cancer ([place]) 11/30/2021   Left renal mass 11/30/2021   ESRD on dialysis [facility])    Primary hypertension    Closed fracture of lateral portion of right tibial plateau 11/26/2021    Orientation RESPIRATION BLADDER Height & Weight     Self, Time, Situation, Place  Normal Continent Weight: 190 lb 7.6 oz (86.4 kg) Height:  '5\' 9"'$  (175.3 cm)  BEHAVIORAL SYMPTOMS/MOOD NEUROLOGICAL BOWEL NUTRITION STATUS      Continent Diet (heart)  AMBULATORY STATUS COMMUNICATION OF NEEDS Skin   Limited Assist Verbally Surgical wounds (r knee)                       Personal Care Assistance Level of Assistance  Bathing, Dressing, Feeding Bathing Assistance: Limited assistance Feeding assistance: Limited  assistance Dressing Assistance: Limited assistance     Functional Limitations Info             SPECIAL CARE FACTORS FREQUENCY  PT (By licensed PT), OT (By licensed OT)     PT Frequency: 5 times per week OT Frequency: 5 times per week            Contractures      Additional Factors Info  Code Status, Allergies Code Status Info: full Allergies Info: Hydralazine, Lisinopril, Bimatoprost, Empagliflozin           Current Medications (02/05/2022):  This is the current hospital active medication list Current Facility-Administered Medications  Medication Dose Route Frequency Provider Last Rate Last Admin   acetaminophen (TYLENOL) tablet 325-650 mg  325-650 mg Oral Q6H PRN Poggi, Marshall Cork, MD       amLODipine (NORVASC) tablet 5 mg  5 mg Oral Daily Poggi, Marshall Cork, MD   5 mg at 02/05/22 7517   apixaban (ELIQUIS) tablet 2.5 mg  2.5 mg Oral BID Lattie Corns, PA-C   2.5 mg at 02/05/22 0017   aspirin EC tablet 81 mg  81 mg Oral BID Corky Mull, MD   81 mg at 02/05/22 0929   atorvastatin (LIPITOR) tablet 80 mg  80 mg Oral QHS Corky Mull, MD   80 mg at 02/04/22 2129   bisacodyl (DULCOLAX) EC tablet 10 mg  10 mg Oral Daily Poggi, Marshall Cork, MD   10 mg at 02/05/22 [phone]  bisacodyl (DULCOLAX) suppository 10 mg  10 mg Rectal Daily PRN Poggi, Marshall Cork, MD       calcium carbonate (TUMS - dosed in mg elemental calcium) chewable tablet 500 mg  500 mg Oral BID WC Poggi, Marshall Cork, MD   500 mg at 02/05/22 2993   carvedilol (COREG) tablet 25 mg  25 mg Oral BID WC Poggi, Marshall Cork, MD   25 mg at 02/05/22 0930   Chlorhexidine Gluconate Cloth 2 % PADS 6 each  6 each Topical Q0600 Poggi, Marshall Cork, MD   6 each at 02/05/22 0413   diphenhydrAMINE (BENADRYL) 12.5 MG/5ML elixir 12.5-25 mg  12.5-25 mg Oral Q4H PRN Poggi, Marshall Cork, MD       docusate sodium (COLACE) capsule 100 mg  100 mg Oral BID Poggi, Marshall Cork, MD   100 mg at 02/05/22 7169   ferrous [id number] C-folic acid (TRINSICON / FOLTRIN) capsule 1  capsule  1 capsule Oral BID PC Amin, Soundra Pilon, MD       gabapentin (NEURONTIN) capsule 100 mg  100 mg Oral QHS Poggi, Marshall Cork, MD   100 mg at 02/04/22 2130   hydrALAZINE (APRESOLINE) injection 5 mg  5 mg Intravenous Q2H PRN Poggi, Marshall Cork, MD   5 mg at 02/03/22 1535   insulin aspart (novoLOG) injection 0-5 Units  0-5 Units Subcutaneous QHS Corky Mull, MD   2 Units at 02/04/22 2127   insulin aspart (novoLOG) injection 0-9 Units  0-9 Units Subcutaneous TID WC Poggi, Marshall Cork, MD   2 Units at 02/04/22 1638   insulin glargine-yfgn (SEMGLEE) injection 4 Units  4 Units Subcutaneous QHS Corky Mull, MD   4 Units at 02/04/22 2128   magnesium hydroxide (MILK OF MAGNESIA) suspension 30 mL  30 mL Oral Daily PRN Poggi, Marshall Cork, MD       melatonin tablet 5 mg  5 mg Oral QHS Poggi, Marshall Cork, MD   5 mg at 02/04/22 2129   methocarbamol (ROBAXIN) tablet 500 mg  500 mg Oral Q8H PRN Poggi, Marshall Cork, MD       metoCLOPramide (REGLAN) tablet 5-10 mg  5-10 mg Oral Q8H PRN Poggi, Marshall Cork, MD       Or   metoCLOPramide (REGLAN) injection 5-10 mg  5-10 mg Intravenous Q8H PRN Poggi, Marshall Cork, MD       morphine (PF) 2 MG/ML injection 2 mg  2 mg Intravenous Q4H PRN Poggi, Marshall Cork, MD   2 mg at 02/05/22 7782   multivitamin (RENA-VIT) tablet 1 tablet  1 tablet Oral QHS Poggi, Marshall Cork, MD   1 tablet at 02/04/22 2128   ondansetron (ZOFRAN) tablet 4 mg  4 mg Oral Q6H PRN Poggi, Marshall Cork, MD       Or   ondansetron (ZOFRAN) injection 4 mg  4 mg Intravenous Q6H PRN Poggi, Marshall Cork, MD       oxybutynin (DITROPAN) tablet 5 mg  5 mg Oral Q8H PRN Poggi, Marshall Cork, MD       oxyCODONE (Oxy IR/ROXICODONE) immediate release tablet 5-10 mg  5-10 mg Oral Q4H PRN Poggi, Marshall Cork, MD   10 mg at 02/05/22 0745   pantoprazole (PROTONIX) EC tablet 40 mg  40 mg Oral Q12H Poggi, Marshall Cork, MD   40 mg at 02/05/22 0929   polyethylene glycol (MIRALAX / GLYCOLAX) packet 17 g  17 g Oral QHS Corky Mull, MD   17 g at 02/04/22 2128  predniSONE (DELTASONE) tablet 10 mg  10 mg  Oral Q breakfast Poggi, Marshall Cork, MD   10 mg at 02/05/22 0929   senna-docusate (Senokot-S) tablet 1 tablet  1 tablet Oral QHS PRN Poggi, Marshall Cork, MD       senna-docusate (Senokot-S) tablet 2 tablet  2 tablet Oral BID Poggi, Marshall Cork, MD   2 tablet at 02/05/22 3007   simethicone (MYLICON) chewable tablet 80 mg  80 mg Oral Q6H Poggi, Marshall Cork, MD   80 mg at 02/05/22 6226   sodium phosphate (FLEET) 7-19 GM/118ML enema 1 enema  1 enema Rectal Once PRN Poggi, Marshall Cork, MD       tamsulosin [facility]) capsule 0.4 mg  0.4 mg Oral QHS Poggi, Marshall Cork, MD   0.4 mg at 02/04/22 2129   torsemide (DEMADEX) tablet 20 mg  20 mg Oral BID Corky Mull, MD   20 mg at 02/05/22 3335   Vitamin D (Ergocalciferol) (DRISDOL) capsule 50,000 Units  50,000 Units Oral Q Sat Poggi, Marshall Cork, MD   50,000 Units at 02/05/22 0930     Discharge Medications: Please see discharge summary for a list of discharge medications.  Relevant Imaging Results:  Relevant Lab Results:   Additional Information SS #: [place], LCSW

## 2022-02-05 NOTE — Plan of Care (Signed)

## 2022-02-05 NOTE — Progress Notes (Addendum)
  Subjective: 2 Days Post-Op Procedure(s) (LRB): OPEN REDUCTION INTERNAL FIXATION (ORIF) DISTAL FEMUR FRACTURE (Right) Patient reports pain as mild.   Patient is well, and has had no acute complaints or problems Negative for chest pain and shortness of breath Fever: no Gastrointestinal:Negative for nausea and vomiting. No BM  Patient states that he is passing gas without pain this morning. PT recommending skilled nursing facility  Objective: Vital signs in last 24 hours: Temp:  [98.1 F (36.7 C)-99.8 F (37.7 C)] 99.1 F (37.3 C) (06/17 0814) Pulse Rate:  [38-153] 89 (06/17 0814) Resp:  [10-22] 18 (06/17 0814) BP: (114-168)/(86-141) 149/86 (06/17 0814) SpO2:  [95 %-100 %] 98 % (06/17 0814) Weight:  [86.4 kg] 86.4 kg (06/16 1303)  Intake/Output from previous day:  Intake/Output Summary (Last 24 hours) at 02/05/2022 1004 Last data filed at 02/05/2022 0530 Gross per 24 hour  Intake 600 ml  Output 0 ml  Net 600 ml    Intake/Output this shift: No intake/output data recorded.  Labs: Recent Labs    02/03/22 1207 02/05/22 0437  HGB 12.2* 9.8*   Recent Labs    02/03/22 1207 02/05/22 0437  WBC 5.7 7.9  RBC 3.90* 3.22*  HCT 39.4 31.5*  PLT 125* 105*   Recent Labs    02/04/22 0342 02/05/22 0437  NA 141 135  K 3.2* 3.6  CL 106 99  CO2 23 26  BUN 51* 37*  CREATININE 4.26* 3.62*  GLUCOSE 240* 102*  CALCIUM 7.4* 7.7*   Recent Labs    02/03/22 1600  INR 1.2   EXAM General - Patient is Alert and Appropriate Extremity - ABD soft Neurovascular intact Dorsiflexion/Plantar flexion intact Incision: dressing C/D/I No cellulitis present Dressing/Incision - clean, dry, no drainage to the ACE wraps applied to the right leg.  Knee hinged brace intact right lower extremity. Motor Function - intact, moving foot and toes well on exam.  Abdomen soft with intact bowel sounds.  Past Medical History:  Diagnosis Date   Diabetes mellitus without complication (HCC)    ESRD  (end stage renal disease) ([place])    Hypercholesteremia    Hypertension    Renal disorder     Assessment/Plan: 2 Days Post-Op Procedure(s) (LRB): OPEN REDUCTION INTERNAL FIXATION (ORIF) DISTAL FEMUR FRACTURE (Right) Principal Problem:   Closed fracture of right distal femur (HCC) Active Problems:   ESRD on dialysis ([place])   Chronic diastolic CHF (congestive heart failure) (HCC)   Type II diabetes mellitus with renal manifestations (HCC)   HTN (hypertension)   HLD (hyperlipidemia)   Atrial fibrillation, chronic ([place])   Fall at home, initial encounter   Hypokalemia  Estimated body mass index is 28.13 kg/m as calculated from the following:   Height as of this encounter: '5\' 9"'$  (1.753 m).   Weight as of this encounter: 86.4 kg.  Pain well controlled  Vital signs are stable.  Low-grade temp encourage incentive spirometer  Acute postop blood loss anemia -hemoglobin 9.8.  Continue with iron supplement and continue to monitor hemoglobin  Continue with physical therapy.  Patient is non-weightbearing to the right lower extremity, can unlock the knee brace and work to active flexion up to 90 degrees.  Work on bowel movement  Care management to assist with discharge to skilled nursing facility   Patient will need 2-week follow-up with [facility] orthopedics   DVT Prophylaxis -  Eliquis Non-weightbearing to the right leg.  Ronney Asters, PA-C [facility] 02/05/2022, 10:04 AM

## 2022-02-05 NOTE — Progress Notes (Signed)
Progress Note   Patient: Micheal Aguirre ELF:[id number] DOB: 07-06-1956 DOA: 02/03/2022     2 DOS: the patient was seen and examined on 02/05/2022   Brief hospital course: Taken from H&P.  Micheal Aguirre is a 66 y.o. male with medical history significant of ESRD-HD (MWF), hypertension, hyperlipidemia, diabetes mellitus, GERD, dCHF, A-fib not on anticoagulants, prostate cancer, who presents with fall and right leg pain.    Pt accidentally fell forward at about 2:00 PM when he was sitting in chair, trying to pick up a rock and lost his balance.  He injured his right leg above knee area, causing pain, which is constant, sharp, severe, nonradiating, aggravated by movement.  No loss of consciousness.  He strongly denies any head or neck injury.  No headache or neck pain.  Refused CT scan of head and neck. Patient does not have chest pain, cough, shortness breath.  No nausea, vomiting, diarrhea or abdominal pain.  No symptoms of UTI.  Patient states that he had dialysis yesterday.   Of note, pt has asymmetric leg edema. He underwent ORIF of right lateral tibial plateau fracture on 11/27/2021. Right leg is worse than the left.  Patient had lower extremity venous Doppler for right leg which was negative for DVT on 01/26/2022.  Data Reviewed and ED Course: pt was found to have WBC 5.7, potassium 3.1, bicarbonate 29, creatinine 3.71, BUN 37, temperature normal, blood pressure 160/96, heart rate 71, RR 18, oxygen saturation 99% on room air.  Negative chest x-ray. X-ray showed mildly displaced acute right distal femur fracture.  Patient is admitted to telemetry bed as inpatient.  Dr. Roland Rack of Ortho and Dr. Candiss Norse of renal are consulted.   X-ray of pelvis: Unusual orientation of the bones of the pelvic ring. RIGHT pubic bone is more enface and RIGHT iliac shows a more open appearance than expected. Perhaps this is somehow related to projection. A repeat pelvic radiograph ensuring the patient is adequately positioned is  suggested. If there is high suspicion for pelvic injury a CT may be warranted in this instance.   EKG: I have personally reviewed.  Atrial fibrillation, QTc 432 T wave inversion in lead I/aVL and V2  6/16: Patient was taken to the OR by orthopedic surgery, s/p retrograde intramedullary nailing of closed displaced extra-articular supracondylar right femur fracture. Per orthopedic recommendation patient will be nonweightbearing to the right leg, right knee brace was placed which he can unlock and avoid to flexion up to 90 degree. Awaiting PT/OT evaluation.  6/17: Labs with hemoglobin decreased to 9.8 after the surgery.  Started on supplement.  PT recommending SNF.   Assessment and Plan: * Closed fracture of right distal femur [facility]) Patient was taken to the OR for retrograde intramedullary nail placement on 6/15. Tolerated the procedure well.  He will be nonweightbearing until recommended by orthopedic surgery.  Right knee brace was in place. PT is recommending SNF -Continue with pain management -Continue with supportive care  Fall at home, initial encounter -PT/OT when able to   ESRD on dialysis [facility]) Patient had schedule on MWF, no missed dialysis. -Continue dialysis per nephrology  Chronic diastolic CHF (congestive heart failure) ([place]) Okay patient has asymmetric leg edema, right leg is worse than the left.  Patient had negative lower extremity venous Doppler for DVT recently.  No shortness breath or chest pain.  No pulm edema chest x-ray.  CHF seems to be compensated.  2D echo on 05/31/2021 showed EF> 55%. -Volume management per renal by dialysis  HTN (hypertension) - IV hydralazine as needed -Amlodipine, Coreg -Patient is also on torsemide  HLD (hyperlipidemia) - Lipitor  Type II diabetes mellitus with renal manifestations (HCC) Recent A1c 5.6, well controlled.  Patient is taking NovoLog and Lantus 8 units daily -Sliding scale insulin -Glargine insulin 4 units  daily  Atrial fibrillation, chronic (HCC) Heart rate is 71.  -Continue Eliquis -Continue Coreg  Hypokalemia Potassium is 3.1, magnesium at 2.1 -Will not replete potassium since patient is ESRD    Subjective: Patient was complaining of some worsening of right leg pain while working with PT, improved with rest.  Physical Exam: Vitals:   02/04/22 1649 02/04/22 2006 02/05/22 0530 02/05/22 0814  BP: (!) 157/105 (!) 138/94 (!) 142/98 (!) 149/86  Pulse: 75 66 76 89  Resp: '16 18 19 18  '$ Temp: 98.2 F (36.8 C) 98.1 F (36.7 C) 99.1 F (37.3 C) 99.1 F (37.3 C)  TempSrc:   Oral Oral  SpO2: 100% 99% 100% 98%  Weight:      Height:       General.     In no acute distress. Pulmonary.  Lungs clear bilaterally, normal respiratory effort. CV.  Regular rate and rhythm, no JVD, rub or murmur. Abdomen.  Soft, nontender, nondistended, BS positive. CNS.  Alert and oriented .  No focal neurologic deficit. Extremities.  No edema, no cyanosis, pulses intact and symmetrical.R Knee with brace and [facility].  Judgment and insight appears normal.  Data Reviewed: Prior notes, labs and images reviewed.  Family Communication:   Disposition: Status is: Inpatient Remains inpatient appropriate because: Severity of illness   Planned Discharge Destination: Skilled nursing facility  DVT prophylaxis, Eliquis Time spent: 40 minutes  This record has been created using Systems analyst. Errors have been sought and corrected,but may not always be located. Such creation errors do not reflect on the standard of care.  Author: Lorella Nimrod, MD 02/05/2022 3:42 PM  For on call review [email or web address].

## 2022-02-05 NOTE — Assessment & Plan Note (Signed)
Patient was taken to the OR for retrograde intramedullary nail placement on 6/15. Tolerated the procedure well.  He will be nonweightbearing until recommended by orthopedic surgery.  Right knee brace was in place. PT is recommending SNF -Continue with pain management -Continue with supportive care

## 2022-02-05 NOTE — Progress Notes (Signed)
Physical Therapy Treatment Patient Details Name: Micheal Aguirre MRN: [id number] DOB: 08/26/1955 Today's Date: 02/05/2022   History of Present Illness 66 y.o. male with medical history significant for ESRD on HD MWF, hypertension, type 2 diabetes, hyperlipidemia, coronary artery disease status post CABG, HFrEF 35%, duodenal ulcers, history of GI bleed, prostate cancer, with metastasis to bone, left renal mass, R tibial plateau fracture s/p ORIF (11/27/21) who presented to [facility] ED via EMS after a fall with acute onset of R LE pain; admitted for management of R distal femur fracture, s/p ORIF (02/03/22), NWB    PT Comments    Pt was long sitting in bed upon arriving. He is A and O x 3. Does need some orientation to overall situation. He is aware of NWB restriction and no bending knee past 90 degrees. RLE in ROM hinged knee brace throughout. Does endorse severe 7/10 pain that he reports is worse than previous date. He presents with severe RLE (especially below knee) pitting edema. MD contacted and both he and pt state the edema has been present prior to admission. Pt was able to exit L side of bed with mod assist + a lot of time due to pain. Tolerated ROM to 90 degrees while at EOB prior to standing several time to RW. He was able to maintain proper NWB with vcs. Progressed to stand pivot from EOB to recliner with mod assist of one. Unsafe currently to attempt "hopping" gait pattern due to severity of pain and pt's fatigue level with only static standing. PT will continue to follow and progress as able per current POC. SNF at DC most appropriate until pt is able to independently and safely perform ADLs.     Recommendations for follow up therapy are one component of a multi-disciplinary discharge planning process, led by the attending physician.  Recommendations may be updated based on patient status, additional functional criteria and insurance authorization.  Follow Up Recommendations  Skilled nursing-short  term rehab (<3 hours/day)     Assistance Recommended at Discharge Frequent or constant Supervision/Assistance  Patient can return home with the following A lot of help with walking and/or transfers;A lot of help with bathing/dressing/bathroom;Assistance with cooking/housework;Assistance with feeding;Direct supervision/assist for medications management;Direct supervision/assist for financial management;Assist for transportation;Help with stairs or ramp for entrance   Equipment Recommendations  Other (comment) (defer to next level of care. may need w/c,cushion, RW, and BSC)       Precautions / Restrictions Precautions Precautions: Fall Required Braces or Orthoses: Other Brace (ROM hinged knee brace) Knee Immobilizer - Right: Other (comment) (ok to unlock ROM knee brace with ROM up to 90 degrees with PT.) Restrictions Weight Bearing Restrictions: Yes RLE Weight Bearing: Non weight bearing     Mobility  Bed Mobility Overal bed mobility: Needs Assistance Bed Mobility: Supine to Sit  Supine to sit: Min assist, Mod assist, HOB elevated  General bed mobility comments: increased time to perform due to pain. does require assistance to progress RLE to EOB    Transfers Overall transfer level: Needs assistance Equipment used: Rolling walker (2 wheels) Transfers: Sit to/from Stand, Bed to chair/wheelchair/BSC Sit to Stand: Mod assist, From elevated surface Stand pivot transfers: Mod assist, From elevated surface         General transfer comment: pt stood 2 x EOB while able to maintain proper NWB. proceeded to stand pivot to recliner from EOB. discussed transfer ability with RN staff and recommended chair face wall so pt can stand pivot toward L to  return to bed.    Ambulation/Gait   General Gait Details: unable to "hop" to progress to ambulation    Balance Overall balance assessment: Needs assistance Sitting-balance support: No upper extremity supported, Feet supported Sitting  balance-Leahy Scale: Good     Standing balance support: Bilateral upper extremity supported, During functional activity, Reliant on assistive device for balance Standing balance-Leahy Scale: Fair Standing balance comment: pt tolerated static standing EOB x ~ 20 sec prior to requesting seated rest.       Cognition Arousal/Alertness: Awake/alert Behavior During Therapy: WFL for tasks assessed/performed Overall Cognitive Status: No family/caregiver present to determine baseline cognitive functioning      General Comments: Pt is A and O x 3. very pleasant and cooperative. Does endorse more severe pain today however put forth great effort throughout session.           General Comments General comments (skin integrity, edema, etc.): Did perform AROM EOB to 90 degrees 4 x. pt does endorse severe pain with ROM exercises but once LE placed back into extension, does state its a little better. pt only able to actively move R ankle DF through very small range of motion with minimal active movements of R toes. MD notified and per MD and pt, was having the pitting edema and poor ROM prior to surgical intervention.      Pertinent Vitals/Pain Pain Assessment Pain Assessment: 0-10 Pain Score: 7  Faces Pain Scale: Hurts even more Pain Location: R knee/LE Pain Descriptors / Indicators: Aching, Guarding, Grimacing Pain Intervention(s): Limited activity within patient's tolerance, Monitored during session, Premedicated before session, Repositioned     PT Goals (current goals can now be found in the care plan section) Acute Rehab PT Goals Patient Stated Goal: "get better so I can return to [facility]." Progress towards PT goals: Progressing toward goals    Frequency    7X/week      PT Plan Current plan remains appropriate       AM-PAC PT "6 Clicks" Mobility   Outcome Measure  Help needed turning from your back to your side while in a flat bed without using bedrails?: A Little Help  needed moving from lying on your back to sitting on the side of a flat bed without using bedrails?: A Lot Help needed moving to and from a bed to a chair (including a wheelchair)?: A Lot Help needed standing up from a chair using your arms (e.g., wheelchair or bedside chair)?: A Lot Help needed to walk in hospital room?: Total Help needed climbing 3-5 steps with a railing? : Total 6 Click Score: 11    End of Session   Activity Tolerance: Patient tolerated treatment well;Patient limited by pain Patient left: in chair;with call bell/phone within reach;with chair alarm set Nurse Communication: Mobility status PT Visit Diagnosis: Muscle weakness (generalized) (M62.81);Difficulty in walking, not elsewhere classified (R26.2)     Time: 0932-6712 PT Time Calculation (min) (ACUTE ONLY): 18 min  Charges:  $Therapeutic Activity: 8-22 mins                    Julaine Fusi PTA 02/05/22, 4:41 PM

## 2022-02-05 NOTE — Progress Notes (Signed)
[facility]  ROUNDING NOTE   Subjective:   Micheal Aguirre is a 44 male with past medical history of hypertension, diabetes, GERD, atrial fib, and end stage renal disease on hemodialysis. Patient presents to the ED after a fall and right leg pain. Patient was admitted for Closed left hip fracture ([place]) [S72.002A] Closed fracture of distal end of right femur, unspecified fracture morphology, initial encounter [facility]) [S72.401A]  Patient is known to our practice from previous admission. He receives outpatient dialysis treatments at [facility] on a MWF schedule. He received a full treatment on Wednesday. Patient is a poor historian and chart review used to obtain history. It reveals patient was sitting in a chair, reaching to pick up a rock and lost his balance. He reported pain above the knee area. On ED arrival, patient is found to have a comminuted fracture of the distal shaft of his femur. Orthopedics consulted.  This morning, patient is pleasant.  Denies any shortness of breath   Objective:  Vital signs in last 24 hours:  Temp:  [98.1 F (36.7 C)-99.8 F (37.7 C)] 99.1 F (37.3 C) (06/17 0814) Pulse Rate:  [51-153] 89 (06/17 0814) Resp:  [12-22] 18 (06/17 0814) BP: (138-168)/(86-141) 149/86 (06/17 0814) SpO2:  [95 %-100 %] 98 % (06/17 0814) Weight:  [86.4 kg] 86.4 kg (06/16 1303)  Weight change: 6.653 kg Filed Weights   02/03/22 1719 02/04/22 0957 02/04/22 1303  Weight: 81.6 kg 88.3 kg 86.4 kg    Intake/Output: I/O last 3 completed shifts: In: 1270 [P.O.:720; I.V.:500; IV Piggyback:50] Out: 200 [Urine:100; Blood:100]   Intake/Output this shift:  No intake/output data recorded.  Physical Exam: General: NAD, resting comfortably  Head: Normocephalic, atraumatic. Moist oral mucosal membranes  Eyes: Anicteric  Lungs:  Clear to auscultation, normal effort  Heart: Regular rate and rhythm  Abdomen:  Soft, nontender  Extremities: Right leg in brace  Neurologic:  Alert, able to answer simple questions appropriately.  Skin: No lesions  Access: Rt Permcath    Basic Metabolic Panel: Recent Labs  Lab 02/03/22 1209 02/03/22 1300 02/04/22 0342 02/05/22 0437  NA 142  --  141 135  K 3.1*  --  3.2* 3.6  CL 105  --  106 99  CO2 29  --  23 26  GLUCOSE 106*  --  240* 102*  BUN 37*  --  51* 37*  CREATININE 3.71*  --  4.26* 3.62*  CALCIUM 8.4*  --  7.4* 7.7*  MG  --  2.1  --   --      Liver Function Tests: Recent Labs  Lab 02/03/22 1209  AST 25  ALT 28  ALKPHOS 117  BILITOT 0.7  PROT 6.9  ALBUMIN 3.7    No results for input(s): "LIPASE", "AMYLASE" in the last 168 hours. No results for input(s): "AMMONIA" in the last 168 hours.  CBC: Recent Labs  Lab 02/03/22 1207 02/05/22 0437  WBC 5.7 7.9  NEUTROABS 4.8  --   HGB 12.2* 9.8*  HCT 39.4 31.5*  MCV 101.0* 97.8  PLT 125* 105*     Cardiac Enzymes: No results for input(s): "CKTOTAL", "CKMB", "CKMBINDEX", "TROPONINI" in the last 168 hours.  BNP: Invalid input(s): "POCBNP"  CBG: Recent Labs  Lab 02/04/22 0811 02/04/22 1403 02/04/22 1638 02/04/22 2115 02/05/22 0747  GLUCAP 144* 166* 160* 207* 104*     Microbiology: Results for orders placed or performed during the hospital encounter of 11/26/21  Resp Panel by RT-PCR (Flu A&B, Covid)  Nasopharyngeal Swab     Status: None   Collection Time: 11/26/21  6:07 PM   Specimen: Nasopharyngeal Swab; Nasopharyngeal(NP) swabs in vial transport medium  Result Value Ref Range Status   SARS Coronavirus 2 by RT PCR NEGATIVE NEGATIVE Final    Comment: (NOTE) SARS-CoV-2 target nucleic acids are NOT DETECTED.  The SARS-CoV-2 RNA is generally detectable in upper respiratory specimens during the acute phase of infection. The lowest concentration of SARS-CoV-2 viral copies this assay can detect is 138 copies/mL. A negative result does not preclude SARS-Cov-2 infection and should not be used as the sole basis for treatment or other  patient management decisions. A negative result may occur with  improper specimen collection/handling, submission of specimen other than nasopharyngeal swab, presence of viral mutation(s) within the areas targeted by this assay, and inadequate number of viral copies(<138 copies/mL). A negative result must be combined with clinical observations, patient history, and epidemiological information. The expected result is Negative.  Fact Sheet for Patients:  EntrepreneurPulse.com.au  Fact Sheet for Healthcare Providers:  IncredibleEmployment.be  This test is no t yet approved or cleared by the [place] FDA and  has been authorized for detection and/or diagnosis of SARS-CoV-2 by FDA under an Emergency Use Authorization (EUA). This EUA will remain  in effect (meaning this test can be used) for the duration of the COVID-19 declaration under Section 564(b)(1) of the Act, 21 U.S.C.section 360bbb-3(b)(1), unless the authorization is terminated  or revoked sooner.       Influenza A by PCR NEGATIVE NEGATIVE Final   Influenza B by PCR NEGATIVE NEGATIVE Final    Comment: (NOTE) The Xpert Xpress SARS-CoV-2/FLU/RSV plus assay is intended as an aid in the diagnosis of influenza from Nasopharyngeal swab specimens and should not be used as a sole basis for treatment. Nasal washings and aspirates are unacceptable for Xpert Xpress SARS-CoV-2/FLU/RSV testing.  Fact Sheet for Patients: EntrepreneurPulse.com.au  Fact Sheet for Healthcare Providers: IncredibleEmployment.be  This test is not yet approved or cleared by the [place] FDA and has been authorized for detection and/or diagnosis of SARS-CoV-2 by FDA under an Emergency Use Authorization (EUA). This EUA will remain in effect (meaning this test can be used) for the duration of the COVID-19 declaration under Section 564(b)(1) of the Act, 21 U.S.C. section  360bbb-3(b)(1), unless the authorization is terminated or revoked.  Performed at [facility], [address] [place], [place] [address]   Surgical PCR screen     Status: None   Collection Time: 11/27/21  6:49 AM   Specimen: Nasal Mucosa; Nasal Swab  Result Value Ref Range Status   MRSA, PCR NEGATIVE NEGATIVE Final   Staphylococcus aureus NEGATIVE NEGATIVE Final    Comment: (NOTE) The Xpert SA Assay (FDA approved for NASAL specimens in patients 77 years of age and older), is one component of a comprehensive surveillance program. It is not intended to diagnose infection nor to guide or monitor treatment. Performed at [facility] [place], [place] [address]     Coagulation Studies: Recent Labs    02/03/22 1600  LABPROT 15.2  INR 1.2     Urinalysis: No results for input(s): "COLORURINE", "LABSPEC", "PHURINE", "GLUCOSEU", "HGBUR", "BILIRUBINUR", "KETONESUR", "PROTEINUR", "UROBILINOGEN", "NITRITE", "LEUKOCYTESUR" in the last 72 hours.  Invalid input(s): "APPERANCEUR"    Imaging: DG FEMUR, MIN 2 VIEWS RIGHT  Result Date: 02/03/2022 CLINICAL DATA:  Fracture of the right femur. EXAM: RIGHT FEMUR 2 VIEWS COMPARISON:  Earlier radiograph dated 02/03/2022. FINDINGS:  Four intraoperative fluoroscopic spot images provided. The total fluoroscopic time is 1 minute 46 seconds. Status post ORIF of femoral fracture. IMPRESSION: Status post ORIF of the femoral fracture. Electronically Signed   By: Anner Crete M.D.   On: 02/03/2022 21:17   DG C-Arm 1-60 Min-No Report  Result Date: 02/03/2022 Fluoroscopy was utilized by the requesting physician.  No radiographic interpretation.   DG C-Arm 1-60 Min-No Report  Result Date: 02/03/2022 Fluoroscopy was utilized by the requesting physician.  No radiographic interpretation.   DG C-Arm 1-60 Min-No Report  Result Date: 02/03/2022 Fluoroscopy was utilized by the requesting physician.  No radiographic  interpretation.   [place] OR NERVE BLOCK-IMAGE ONLY [facility]  Result Date: 02/03/2022 There is no interpretation for this exam.  This order is for images obtained during a surgical procedure.  Please See "Surgeries" Tab for more information regarding the procedure.   DG FEMUR PORT MIN 2 VIEWS LEFT  Addendum Date: 02/03/2022   ADDENDUM REPORT: 02/03/2022 13:31 ADDENDUM: Images and report applied to the RIGHT femur. There is a comminuted, overriding and displaced fracture of the RIGHT femoral metadiaphysis rather than the LEFT. Electronically Signed   By: Zetta Bills M.D.   On: 02/03/2022 13:31   Result Date: 02/03/2022 CLINICAL DATA:  Knee pain following fall, tibial plateau surgery 10 weeks ago. EXAM: LEFT FEMUR PORTABLE 2 VIEWS COMPARISON:  Pelvic evaluation of the same date. FINDINGS: Mildly comminuted fracture with moderate displacement and over riding of the distal LEFT femoral metadiaphysis. There is posterior and medial displacement of the distal fracture fragment by up to 1 cm with approximately 1-2 cm overlap/over riding. Signs of prior ORIF of the RIGHT tibial plateau are demonstrated. No additional fractures are identified. IMPRESSION: Mildly comminuted, displaced and over riding fracture of the distal LEFT femoral metadiaphysis as described. No additional fractures are seen. Electronically Signed: By: Zetta Bills M.D. On: 02/03/2022 12:24   DG FEMUR, MIN 2 VIEWS RIGHT  Result Date: 02/03/2022 CLINICAL DATA:  Right knee pain after a fall today. EXAM: RIGHT FEMUR 2 VIEWS COMPARISON:  Right knee radiographs 11/27/2021 FINDINGS: There is an acute, oblique, mildly comminuted fracture of the distal shaft of the femur which demonstrates mild medial and posterior displacement. The knee and hip are located. Prior ORIF of the proximal tibia and a remote posttraumatic deformity of the proximal fibula are again noted. Soft tissue swelling and atherosclerotic vascular calcifications are noted. IMPRESSION:  Mildly displaced acute distal femur fracture. Electronically Signed   By: Logan Bores M.D.   On: 02/03/2022 13:21   DG Pelvis 1-2 Views  Result Date: 02/03/2022 CLINICAL DATA:  A 66 year old male presents for evaluation of hip pain and knee pain following fall. EXAM: PELVIS - 1-2 VIEW COMPARISON:  Femoral evaluation of the same date. FINDINGS: Osteopenia. Images mildly rotated. The orientation of the iliac bones is disparate with that of the pubic bones accounting for rotation, it is unclear whether this is projectional. Based on orientation of the pubic bone on the RIGHT in the iliac bone on the RIGHT it would be difficult to exclude the possibility of pelvic ring injury. Recommend attempt at repeat imaging or CT for further assessment. . IMPRESSION: Unusual orientation of the bones of the pelvic ring. RIGHT pubic bone is more enface and RIGHT iliac shows a more open appearance than expected. Perhaps this is somehow related to projection. A repeat pelvic radiograph ensuring the patient is adequately positioned is suggested. If there is high suspicion for pelvic injury  a CT may be warranted in this instance. Electronically Signed   By: Zetta Bills M.D.   On: 02/03/2022 12:39   DG Chest Portable 1 View  Result Date: 02/03/2022 CLINICAL DATA:  RIGHT knee pain following fall. EXAM: PORTABLE CHEST 1 VIEW COMPARISON:  None available FINDINGS: Dialysis catheter in situ tip at the upper portion of the RIGHT atrium. Post median sternotomy for CABG. Cardiomediastinal contours and hilar structures are normal accounting for low depth of expansion with basilar atelectasis. No sign of consolidation. No pneumothorax. On limited assessment no acute skeletal findings. IMPRESSION: 1. Signs of basilar atelectasis without acute cardiopulmonary disease. 2. Dialysis catheter in situ tip at the upper portion of the RIGHT atrium. Electronically Signed   By: Zetta Bills M.D.   On: 02/03/2022 12:30     Medications:       amLODipine  5 mg Oral Daily   apixaban  2.5 mg Oral BID   aspirin EC  81 mg Oral BID   atorvastatin  80 mg Oral QHS   bisacodyl  10 mg Oral Daily   calcium carbonate  500 mg Oral BID WC   carvedilol  25 mg Oral BID WC   Chlorhexidine Gluconate Cloth  6 each Topical Q0600   docusate sodium  100 mg Oral BID   ferrous [id number] C-folic acid  1 capsule Oral BID PC   gabapentin  100 mg Oral QHS   insulin aspart  0-5 Units Subcutaneous QHS   insulin aspart  0-9 Units Subcutaneous TID WC   insulin glargine-yfgn  4 Units Subcutaneous QHS   melatonin  5 mg Oral QHS   multivitamin  1 tablet Oral QHS   pantoprazole  40 mg Oral Q12H   polyethylene glycol  17 g Oral QHS   predniSONE  10 mg Oral Q breakfast   senna-docusate  2 tablet Oral BID   simethicone  80 mg Oral Q6H   tamsulosin  0.4 mg Oral QHS   torsemide  20 mg Oral BID   Vitamin D (Ergocalciferol)  50,000 Units Oral Q Sat   acetaminophen, bisacodyl, diphenhydrAMINE, hydrALAZINE, magnesium hydroxide, methocarbamol, metoCLOPramide **OR** metoCLOPramide (REGLAN) injection, morphine injection, ondansetron **OR** ondansetron (ZOFRAN) IV, oxybutynin, oxyCODONE, senna-docusate, sodium phosphate  Assessment/ Plan:  Micheal Aguirre is a 66 y.o.  male with past medical history of hypertension, diabetes, GERD, atrial fib, and end stage renal disease on hemodialysis. Patient presents to the ED after a fall and right leg pain. Patient was admitted for Closed left hip fracture ([place]) [S72.002A] Closed fracture of distal end of right femur, unspecified fracture morphology, initial encounter ([place]) [S72.401A]  [facility]/MWF/Rt Permcath  End stage renal disease on hemodialysis with hypokalemia . Will maintain outpatient schedule if possible.   Tolerated his treatment on Friday.  Next treatment scheduled for Monday.    2. Anemia of chronic kidney disease Lab Results  Component Value Date   HGB 9.8 (L) 02/05/2022    Hemoglobin  remains within acceptable range.  We will continue to monitor.  3. Secondary Hyperparathyroidism:  Lab Results  Component Value Date   CALCIUM 7.7 (L) 02/05/2022   PHOS 2.9 12/02/2021    Calcium not within acceptable range.  Continue calcium carbonate and vitamin D supplementation.  4. Diabetes mellitus type II with chronic kidney disease  insulin dependent. Home regimen includes Lantus and NovoLog. Most recent hemoglobin A1c is 5.6 on 11/27/21.   5.  Closed fracture of the right distal femur.  ORIF completed on 6/15.  Ortho continues to follow.  Pain management, PT/OT. Patient has to be able to sit in her recliner chair for the duration of dialysis treatment prior to discharge.   LOS: 2 Carsynn Bethune Candiss Norse 6/17/202311:49 AM

## 2022-02-05 NOTE — TOC Initial Note (Signed)
Transition of Care [facility]) - Initial/Assessment Note    Patient Details  Name: Micheal Aguirre MRN: [id number] Date of Birth: 1955/11/02  [facility]) CM/SW Contact:    Magnus Ivan, LCSW Phone Number: 02/05/2022, 10:34 AM  Clinical Narrative:                 CSW spoke with patient regarding SNF rec.  Patient lives at [facility], says he has been there about a year.  PCP is [facility]. Patient has a RW, cane, and rollator he uses as needed.  Patient stated [facility] takes him to appointments. Patient is agreeable to SNF. SNF workup started.    Expected Discharge Plan: Skilled Nursing Facility Barriers to Discharge: Continued Medical Work up   Patient Goals and CMS Choice Patient states their goals for this hospitalization and ongoing recovery are:: SNF CMS Medicare.gov Compare Post Acute Care list provided to:: Patient Choice offered to / list presented to : Patient  Expected Discharge Plan and Services Expected Discharge Plan: [place] arrangements for the past 2 months: [place]                                      Prior Living Arrangements/Services Living arrangements for the past 2 months: [place] Lives with:: Facility Resident Patient language and need for interpreter reviewed:: Yes Do you feel safe going back to the place where you live?: Yes      Need for Family Participation in Patient Care: Yes (Comment) Care giver support system in place?: Yes (comment) Current home services: DME Criminal Activity/Legal Involvement Pertinent to Current Situation/Hospitalization: No - Comment as needed  Activities of Daily Living Home Assistive Devices/Equipment: Environmental consultant (specify type), Cane (specify quad or straight), Wheelchair, Eyeglasses ADL Screening (condition at time of admission) Patient's cognitive ability adequate to safely complete daily activities?: Yes Is the  patient deaf or have difficulty hearing?: No Does the patient have difficulty seeing, even when wearing glasses/contacts?: No Does the patient have difficulty concentrating, remembering, or making decisions?: No Patient able to express need for assistance with ADLs?: Yes Does the patient have difficulty dressing or bathing?: No Independently performs ADLs?: No Communication: Independent Dressing (OT): Independent Grooming: Independent Feeding: Independent Bathing: Needs assistance Is this a change from baseline?: Pre-admission baseline Toileting: Independent In/Out Bed: Independent Walks in Home: Independent with device (comment) Does the patient have difficulty walking or climbing stairs?: Yes Weakness of Legs: Both Weakness of Arms/Hands: None  Permission Sought/Granted Permission sought to share information with : Facility Art therapist granted to share information with : Yes, Verbal Permission Granted     Permission granted to share info w AGENCY: SNFs        Emotional Assessment       Orientation: : Oriented to Self, Oriented to Place, Oriented to  Time, Oriented to Situation Alcohol / Substance Use: Not Applicable Psych Involvement: No (comment)  Admission diagnosis:  Closed left hip fracture ([place]) [S72.002A] Closed fracture of distal end of right femur, unspecified fracture morphology, initial encounter [facility]) [S72.401A] Patient Active Problem List   Diagnosis Date Noted   Type II diabetes mellitus with renal manifestations ([place]) 02/03/2022   HTN (hypertension) 02/03/2022   HLD (hyperlipidemia) 02/03/2022   Atrial fibrillation, chronic ([place]) 02/03/2022   Fall at home, initial encounter 02/03/2022   Hypokalemia 02/03/2022  Closed fracture of right distal femur ([place]) 02/03/2022   Chronic diastolic CHF (congestive heart failure) ([place]) 11/30/2021   DMII (diabetes mellitus, type 2) ([place]) 11/30/2021   Prostate cancer ([place]) 11/30/2021   Left renal  mass 11/30/2021   ESRD on dialysis [facility])    Primary hypertension    Closed fracture of lateral portion of right tibial plateau 11/26/2021   PCP:  Pcp, No Pharmacy:   [facility], [place] [address] [place] [address] Phone: [phone] Fax: [phone]     Social Determinants of Health (SDOH) Interventions    Readmission Risk Interventions     No data to display

## 2022-02-06 DIAGNOSIS — I5032 Chronic diastolic (congestive) heart failure: Secondary | ICD-10-CM | POA: Diagnosis not present

## 2022-02-06 DIAGNOSIS — S72401A Unspecified fracture of lower end of right femur, initial encounter for closed fracture: Secondary | ICD-10-CM | POA: Diagnosis not present

## 2022-02-06 DIAGNOSIS — I482 Chronic atrial fibrillation, unspecified: Secondary | ICD-10-CM | POA: Diagnosis not present

## 2022-02-06 LAB — CBC
HCT: 28.4 % — ABNORMAL LOW (ref 39.0–52.0)
Hemoglobin: 8.9 g/dL — ABNORMAL LOW (ref 13.0–17.0)
MCH: 30.8 pg (ref 26.0–34.0)
MCHC: 31.3 g/dL (ref 30.0–36.0)
MCV: 98.3 fL (ref 80.0–100.0)
Platelets: 101 10*3/uL — ABNORMAL LOW (ref 150–400)
RBC: 2.89 MIL/uL — ABNORMAL LOW (ref 4.22–5.81)
RDW: 16.3 % — ABNORMAL HIGH (ref 11.5–15.5)
WBC: 5.5 10*3/uL (ref 4.0–10.5)
nRBC: 0 % (ref 0.0–0.2)

## 2022-02-06 LAB — GLUCOSE, CAPILLARY
Glucose-Capillary: 108 mg/dL — ABNORMAL HIGH (ref 70–99)
Glucose-Capillary: 138 mg/dL — ABNORMAL HIGH (ref 70–99)
Glucose-Capillary: 160 mg/dL — ABNORMAL HIGH (ref 70–99)
Glucose-Capillary: 155 mg/dL — ABNORMAL HIGH (ref 70–99)

## 2022-02-06 NOTE — Assessment & Plan Note (Signed)
Resolved.  Continue to monitor.

## 2022-02-06 NOTE — Plan of Care (Signed)

## 2022-02-06 NOTE — Progress Notes (Signed)
Physical Therapy Treatment Patient Details Name: Micheal Aguirre MRN: [id number] DOB: Nov 14, 1955 Today's Date: 02/06/2022   History of Present Illness 66 y.o. male with medical history significant for ESRD on HD MWF, hypertension, type 2 diabetes, hyperlipidemia, coronary artery disease status post CABG, HFrEF 35%, duodenal ulcers, history of GI bleed, prostate cancer, with metastasis to bone, left renal mass, R tibial plateau fracture s/p ORIF (11/27/21) who presented to [facility] ED via EMS after a fall with acute onset of R LE pain; admitted for management of R distal femur fracture, s/p ORIF (02/03/22), NWB    PT Comments    Pt ready for session.  In chair.  He did state he slept in chair all night and has not been up since PT session yesterday.  Extensive education provided regarding mobility and pressure concerns.  He voiced understanding.  He did stand with mod a x 2 to stand but once up only needed min guard/assist for safety and was able to maintain NWB well with AROM LLE.  Remains standing x 2 minutes before requesting to sit.  Offered and encouraged to assist pt to bed but he declined at this time.  Again stressed importance of positional changes and encouraged him to return to bed later in the day.  Voiced understanding.  Time spent with AAROM R knee in sitting.  Reviewed transfer techniques with pt and LPN in room either by using walker or lateral scoot.   Recommendations for follow up therapy are one component of a multi-disciplinary discharge planning process, led by the attending physician.  Recommendations may be updated based on patient status, additional functional criteria and insurance authorization.  Follow Up Recommendations  Skilled nursing-short term rehab (<3 hours/day)     Assistance Recommended at Discharge Frequent or constant Supervision/Assistance  Patient can return home with the following A lot of help with walking and/or transfers;A lot of help with  bathing/dressing/bathroom;Assistance with cooking/housework;Assistance with feeding;Direct supervision/assist for medications management;Direct supervision/assist for financial management;Assist for transportation;Help with stairs or ramp for entrance   Equipment Recommendations  Other (comment) (defer to next level of care. may need w/c,cushion, RW, and BSC)    Recommendations for Other Services       Precautions / Restrictions Precautions Precautions: Fall Required Braces or Orthoses: Other Brace (ROM hinged knee brace) Knee Immobilizer - Right: Other (comment) (ok to unlock ROM knee brace with ROM up to 90 degrees with PT.) Restrictions Weight Bearing Restrictions: Yes RLE Weight Bearing: Non weight bearing     Mobility  Bed Mobility               General bed mobility comments: in chair before and after    Transfers Overall transfer level: Needs assistance Equipment used: Rolling walker (2 wheels) Transfers: Sit to/from Stand Sit to Stand: Min assist, Mod assist, +2 physical assistance                Ambulation/Gait               General Gait Details: unable to "hop" to progress to ambulation   Stairs             Wheelchair Mobility    Modified Rankin (Stroke Patients Only)       Balance Overall balance assessment: Needs assistance Sitting-balance support: No upper extremity supported, Feet supported Sitting balance-Leahy Scale: Good     Standing balance support: Bilateral upper extremity supported, During functional activity, Reliant on assistive device for balance Standing balance-Leahy Scale: [place] Standing  balance comment: stood x 2 minutes today with AROM RLE prior to sitting                            Cognition Arousal/Alertness: Awake/alert Behavior During Therapy: WFL for tasks assessed/performed Overall Cognitive Status: Within Functional Limits for tasks assessed                                           Exercises Other Exercises Other Exercises: standing and seated AROM - knee flex and ext    General Comments        Pertinent Vitals/Pain Pain Assessment Pain Assessment: 0-10 Pain Score: 8  Pain Location: R knee/LE Pain Descriptors / Indicators: Aching, Guarding, Grimacing Pain Intervention(s): Limited activity within patient's tolerance, Monitored during session, RN gave pain meds during session, Repositioned    Home Living                          Prior Function            PT Goals (current goals can now be found in the care plan section) Progress towards PT goals: Progressing toward goals    Frequency    7X/week      PT Plan Current plan remains appropriate    Co-evaluation              AM-PAC PT "6 Clicks" Mobility   Outcome Measure  Help needed turning from your back to your side while in a flat bed without using bedrails?: A Little Help needed moving from lying on your back to sitting on the side of a flat bed without using bedrails?: A Lot Help needed moving to and from a bed to a chair (including a wheelchair)?: A Lot Help needed standing up from a chair using your arms (e.g., wheelchair or bedside chair)?: A Lot Help needed to walk in hospital room?: Total Help needed climbing 3-5 steps with a railing? : Total 6 Click Score: 11    End of Session   Activity Tolerance: Patient tolerated treatment well;Patient limited by pain Patient left: in chair;with call bell/phone within reach;with chair alarm set Nurse Communication: Mobility status PT Visit Diagnosis: Muscle weakness (generalized) (M62.81);Difficulty in walking, not elsewhere classified (R26.2)     Time: 7591-6384 PT Time Calculation (min) (ACUTE ONLY): 22 min  Charges:  $Therapeutic Exercise: 8-22 mins                   Chesley Noon, PTA 02/06/22, 1:22 PM

## 2022-02-06 NOTE — Progress Notes (Signed)
  Subjective: 3 Days Post-Op Procedure(s) (LRB): OPEN REDUCTION INTERNAL FIXATION (ORIF) DISTAL FEMUR FRACTURE (Right) Patient reports pain as mild.   Patient is well, and has had no acute complaints or problems Negative for chest pain and shortness of breath Fever: no Gastrointestinal:Negative for nausea and vomiting.  + BM PT recommending skilled nursing facility  Objective: Vital signs in last 24 hours: Temp:  [98.2 F (36.8 C)-99.3 F (37.4 C)] 98.2 F (36.8 C) (06/18 0800) Pulse Rate:  [68-77] 72 (06/18 0800) Resp:  [16-18] 18 (06/18 0800) BP: (110-127)/(77-93) 124/87 (06/18 0800) SpO2:  [97 %-100 %] 100 % (06/18 0800)  Intake/Output from previous day:  Intake/Output Summary (Last 24 hours) at 02/06/2022 0903 Last data filed at 02/06/2022 0337 Gross per 24 hour  Intake --  Output 650 ml  Net -650 ml    Intake/Output this shift: No intake/output data recorded.  Labs: Recent Labs    02/03/22 1207 02/05/22 0437 02/06/22 0418  HGB 12.2* 9.8* 8.9*   Recent Labs    02/05/22 0437 02/06/22 0418  WBC 7.9 5.5  RBC 3.22* 2.89*  HCT 31.5* 28.4*  PLT 105* 101*   Recent Labs    02/04/22 0342 02/05/22 0437  NA 141 135  K 3.2* 3.6  CL 106 99  CO2 23 26  BUN 51* 37*  CREATININE 4.26* 3.62*  GLUCOSE 240* 102*  CALCIUM 7.4* 7.7*   Recent Labs    02/03/22 1600  INR 1.2   EXAM General - Patient is Alert and Appropriate Extremity - ABD soft Neurovascular intact Dorsiflexion/Plantar flexion intact Incision: dressing C/D/I No cellulitis present Dressing/Incision - clean, dry, no drainage to the ACE wraps applied to the right leg.  Knee hinged brace intact right lower extremity. Motor Function - intact, moving foot and toes well on exam.  Abdomen soft with intact bowel sounds.  Past Medical History:  Diagnosis Date   Diabetes mellitus without complication (HCC)    ESRD (end stage renal disease) ([place])    Hypercholesteremia    Hypertension    Renal  disorder     Assessment/Plan: 3 Days Post-Op Procedure(s) (LRB): OPEN REDUCTION INTERNAL FIXATION (ORIF) DISTAL FEMUR FRACTURE (Right) Principal Problem:   Closed fracture of right distal femur (HCC) Active Problems:   ESRD on dialysis ([place])   Chronic diastolic CHF (congestive heart failure) (HCC)   Type II diabetes mellitus with renal manifestations (HCC)   HTN (hypertension)   HLD (hyperlipidemia)   Atrial fibrillation, chronic ([place])   Fall at home, initial encounter   Hypokalemia  Estimated body mass index is 28.13 kg/m as calculated from the following:   Height as of this encounter: '5\' 9"'$  (1.753 m).   Weight as of this encounter: 86.4 kg.  Pain well controlled  Vital signs are stable.  Acute postop blood loss anemia -hemoglobin 8.9.  Continue with iron supplement and continue to monitor hemoglobin  Continue with physical therapy.  Patient is non-weightbearing to the right lower extremity, can unlock the knee brace and work to active flexion up to 90 degrees.  Care management to assist with discharge to skilled nursing facility  Patient will need 2-week follow-up with [facility] orthopedics   DVT Prophylaxis -  Eliquis Non-weightbearing to the right leg.  Ronney Asters, PA-C [facility] 02/06/2022, 9:03 AM

## 2022-02-06 NOTE — Progress Notes (Signed)
[facility]  ROUNDING NOTE   Subjective:   Micheal Aguirre is a 66 male with past medical history of hypertension, diabetes, GERD, atrial fib, and end stage renal disease on hemodialysis. Patient presents to the ED after a fall and right leg pain. Patient was admitted for Closed left hip fracture ([place]) [S72.002A] Closed fracture of distal end of right femur, unspecified fracture morphology, initial encounter [facility]) [S72.401A]  Patient is known to our practice from previous admission. He receives outpatient dialysis treatments at [facility] on a MWF schedule. He received a full treatment on Wednesday. Patient is a poor historian and chart review used to obtain history. It reveals patient was sitting in a chair, reaching to pick up a rock and lost his balance. He reported pain above the knee area. On ED arrival, patient is found to have a comminuted fracture of the distal shaft of his femur. Orthopedics consulted.  This morning, patient is pleasant.  Denies any shortness of breath Sitting up in the recliner chair.  Reports some pain with movement of the right leg.   Objective:  Vital signs in last 24 hours:  Temp:  [98.2 F (36.8 C)-99.3 F (37.4 C)] 98.2 F (36.8 C) (06/18 0800) Pulse Rate:  [68-77] 72 (06/18 0800) Resp:  [16-18] 18 (06/18 0800) BP: (110-127)/(77-93) 124/87 (06/18 0800) SpO2:  [97 %-100 %] 100 % (06/18 0800)  Weight change:  Filed Weights   02/03/22 1719 02/04/22 0957 02/04/22 1303  Weight: 81.6 kg 88.3 kg 86.4 kg    Intake/Output: I/O last 3 completed shifts: In: -  Out: 650 [Urine:650]   Intake/Output this shift:  Total I/O In: 360 [P.O.:360] Out: -   Physical Exam: General: NAD, resting comfortably  Head: Normocephalic, atraumatic. Moist oral mucosal membranes  Eyes: Anicteric  Lungs:  Clear to auscultation, normal effort  Heart: Regular rate and rhythm  Abdomen:  Soft, nontender  Extremities: Right leg in brace  Neurologic: Alert, able  to answer simple questions appropriately.  Skin: No lesions  Access: Rt Permcath    Basic Metabolic Panel: Recent Labs  Lab 02/03/22 1209 02/03/22 1300 02/04/22 0342 02/05/22 0437  NA 142  --  141 135  K 3.1*  --  3.2* 3.6  CL 105  --  106 99  CO2 29  --  23 26  GLUCOSE 106*  --  240* 102*  BUN 37*  --  51* 37*  CREATININE 3.71*  --  4.26* 3.62*  CALCIUM 8.4*  --  7.4* 7.7*  MG  --  2.1  --   --      Liver Function Tests: Recent Labs  Lab 02/03/22 1209  AST 25  ALT 28  ALKPHOS 117  BILITOT 0.7  PROT 6.9  ALBUMIN 3.7    No results for input(s): "LIPASE", "AMYLASE" in the last 168 hours. No results for input(s): "AMMONIA" in the last 168 hours.  CBC: Recent Labs  Lab 02/03/22 1207 02/05/22 0437 02/06/22 0418  WBC 5.7 7.9 5.5  NEUTROABS 4.8  --   --   HGB 12.2* 9.8* 8.9*  HCT 39.4 31.5* 28.4*  MCV 101.0* 97.8 98.3  PLT 125* 105* 101*     Cardiac Enzymes: No results for input(s): "CKTOTAL", "CKMB", "CKMBINDEX", "TROPONINI" in the last 168 hours.  BNP: Invalid input(s): "POCBNP"  CBG: Recent Labs  Lab 02/05/22 1219 02/05/22 1603 02/05/22 2130 02/06/22 0828 02/06/22 1204  GLUCAP 169* 150* 165* 108* 138*     Microbiology: Results for orders  placed or performed during the hospital encounter of 11/26/21  Resp Panel by RT-PCR (Flu A&B, Covid) Nasopharyngeal Swab     Status: None   Collection Time: 11/26/21  6:07 PM   Specimen: Nasopharyngeal Swab; Nasopharyngeal(NP) swabs in vial transport medium  Result Value Ref Range Status   SARS Coronavirus 2 by RT PCR NEGATIVE NEGATIVE Final    Comment: (NOTE) SARS-CoV-2 target nucleic acids are NOT DETECTED.  The SARS-CoV-2 RNA is generally detectable in upper respiratory specimens during the acute phase of infection. The lowest concentration of SARS-CoV-2 viral copies this assay can detect is 138 copies/mL. A negative result does not preclude SARS-Cov-2 infection and should not be used as the sole  basis for treatment or other patient management decisions. A negative result may occur with  improper specimen collection/handling, submission of specimen other than nasopharyngeal swab, presence of viral mutation(s) within the areas targeted by this assay, and inadequate number of viral copies(<138 copies/mL). A negative result must be combined with clinical observations, patient history, and epidemiological information. The expected result is Negative.  Fact Sheet for Patients:  EntrepreneurPulse.com.au  Fact Sheet for Healthcare Providers:  IncredibleEmployment.be  This test is no t yet approved or cleared by the [place] FDA and  has been authorized for detection and/or diagnosis of SARS-CoV-2 by FDA under an Emergency Use Authorization (EUA). This EUA will remain  in effect (meaning this test can be used) for the duration of the COVID-19 declaration under Section 564(b)(1) of the Act, 21 U.S.C.section 360bbb-3(b)(1), unless the authorization is terminated  or revoked sooner.       Influenza A by PCR NEGATIVE NEGATIVE Final   Influenza B by PCR NEGATIVE NEGATIVE Final    Comment: (NOTE) The Xpert Xpress SARS-CoV-2/FLU/RSV plus assay is intended as an aid in the diagnosis of influenza from Nasopharyngeal swab specimens and should not be used as a sole basis for treatment. Nasal washings and aspirates are unacceptable for Xpert Xpress SARS-CoV-2/FLU/RSV testing.  Fact Sheet for Patients: EntrepreneurPulse.com.au  Fact Sheet for Healthcare Providers: IncredibleEmployment.be  This test is not yet approved or cleared by the [place] FDA and has been authorized for detection and/or diagnosis of SARS-CoV-2 by FDA under an Emergency Use Authorization (EUA). This EUA will remain in effect (meaning this test can be used) for the duration of the COVID-19 declaration under Section 564(b)(1) of the Act,  21 U.S.C. section 360bbb-3(b)(1), unless the authorization is terminated or revoked.  Performed at [facility], [address] [place], [place] [address]   Surgical PCR screen     Status: None   Collection Time: 11/27/21  6:49 AM   Specimen: Nasal Mucosa; Nasal Swab  Result Value Ref Range Status   MRSA, PCR NEGATIVE NEGATIVE Final   Staphylococcus aureus NEGATIVE NEGATIVE Final    Comment: (NOTE) The Xpert SA Assay (FDA approved for NASAL specimens in patients 18 years of age and older), is one component of a comprehensive surveillance program. It is not intended to diagnose infection nor to guide or monitor treatment. Performed at [facility], [address] [place], [place] [address]     Coagulation Studies: Recent Labs    02/03/22 1600  LABPROT 15.2  INR 1.2     Urinalysis: No results for input(s): "COLORURINE", "LABSPEC", "PHURINE", "GLUCOSEU", "HGBUR", "BILIRUBINUR", "KETONESUR", "PROTEINUR", "UROBILINOGEN", "NITRITE", "LEUKOCYTESUR" in the last 72 hours.  Invalid input(s): "APPERANCEUR"    Imaging: No results found.   Medications:      amLODipine  5 mg Oral Daily   apixaban  2.5 mg Oral BID   aspirin EC  81 mg Oral BID   atorvastatin  80 mg Oral QHS   bisacodyl  10 mg Oral Daily   calcium carbonate  500 mg Oral BID WC   carvedilol  25 mg Oral BID WC   Chlorhexidine Gluconate Cloth  6 each Topical Q0600   docusate sodium  100 mg Oral BID   ferrous [id number] C-folic acid  1 capsule Oral BID PC   gabapentin  100 mg Oral QHS   insulin aspart  0-5 Units Subcutaneous QHS   insulin aspart  0-9 Units Subcutaneous TID WC   insulin glargine-yfgn  4 Units Subcutaneous QHS   melatonin  5 mg Oral QHS   multivitamin  1 tablet Oral QHS   pantoprazole  40 mg Oral Q12H   polyethylene glycol  17 g Oral QHS   predniSONE  10 mg Oral Q breakfast   senna-docusate  2 tablet Oral BID   simethicone  80 mg Oral Q6H   tamsulosin  0.4  mg Oral QHS   torsemide  20 mg Oral BID   Vitamin D (Ergocalciferol)  50,000 Units Oral Q Sat   acetaminophen, bisacodyl, diphenhydrAMINE, hydrALAZINE, magnesium hydroxide, methocarbamol, metoCLOPramide **OR** metoCLOPramide (REGLAN) injection, morphine injection, ondansetron **OR** ondansetron (ZOFRAN) IV, oxybutynin, oxyCODONE, senna-docusate, sodium phosphate  Assessment/ Plan:  Mr. Gerhardt Gleed is a 66 y.o.  male with past medical history of hypertension, diabetes, GERD, atrial fib, and end stage renal disease on hemodialysis. Patient presents to the ED after a fall and right leg pain. Patient was admitted for Closed left hip fracture ([place]) [S72.002A] Closed fracture of distal end of right femur, unspecified fracture morphology, initial encounter ([place]) [S72.401A]  [facility]/MWF/Rt Permcath  End stage renal disease on hemodialysis with hypokalemia . Will maintain outpatient schedule if possible.   Tolerated his treatment on Friday.  Next treatment scheduled for Monday.    2. Anemia of chronic kidney disease Lab Results  Component Value Date   HGB 8.9 (L) 02/06/2022    Hemoglobin remains within acceptable range.  We will continue to monitor.  3. Secondary Hyperparathyroidism:  Lab Results  Component Value Date   CALCIUM 7.7 (L) 02/05/2022   PHOS 2.9 12/02/2021    Calcium not within acceptable range.  Continue calcium carbonate and vitamin D supplementation.  4. Diabetes mellitus type II with chronic kidney disease  insulin dependent. Home regimen includes Lantus and NovoLog. Most recent hemoglobin A1c is 5.6 on 11/27/21.   5.  Closed fracture of the right distal femur.  ORIF completed on 6/15.  Ortho continues to follow.  Pain management, PT/OT. Patient has to be able to sit in recliner chair for the duration of dialysis treatment prior to discharge.   LOS: 3 Tykera Skates 6/18/202312:23 PM

## 2022-02-06 NOTE — TOC Progression Note (Signed)
Transition of Care [facility]) - Progression Note    Patient Details  Name: Micheal Aguirre MRN: [id number] Date of Birth: 10-Aug-1956  Transition of Care [facility]) CM/SW [place], LCSW Phone Number: 02/06/2022, 10:52 AM  Clinical Narrative:     Patient has a bed offer at [facility]. Patient agrees to [facility]. CSW started insurance auth in [place] portal. Notified Tammy at [facility].     Expected Discharge Plan: [place] Barriers to Discharge: Continued Medical Work up  Expected Discharge Plan and Services Expected Discharge Plan: [place] arrangements for the past 2 months: Assisted Living Facility                                       Social Determinants of Health (SDOH) Interventions    Readmission Risk Interventions     No data to display

## 2022-02-06 NOTE — Progress Notes (Signed)
Progress Note   Patient: Micheal Aguirre BHA:[id number] DOB: [birth year] DOA: 02/03/2022     3 DOS: the patient was seen and examined on 02/06/2022   Brief hospital course: Taken from H&P.  Micheal Aguirre is a 66 y.o. male with medical history significant of ESRD-HD (MWF), hypertension, hyperlipidemia, diabetes mellitus, GERD, dCHF, A-fib not on anticoagulants, prostate cancer, who presents with fall and right leg pain.    Pt accidentally fell forward at about 2:00 PM when he was sitting in chair, trying to pick up a rock and lost his balance.  He injured his right leg above knee area, causing pain, which is constant, sharp, severe, nonradiating, aggravated by movement.  No loss of consciousness.  He strongly denies any head or neck injury.  No headache or neck pain.  Refused CT scan of head and neck. Patient does not have chest pain, cough, shortness breath.  No nausea, vomiting, diarrhea or abdominal pain.  No symptoms of UTI.  Patient states that he had dialysis yesterday.   Of note, pt has asymmetric leg edema. He underwent ORIF of right lateral tibial plateau fracture on 11/27/2021. Right leg is worse than the left.  Patient had lower extremity venous Doppler for right leg which was negative for DVT on 01/26/2022.  Data Reviewed and ED Course: pt was found to have WBC 5.7, potassium 3.1, bicarbonate 29, creatinine 3.71, BUN 37, temperature normal, blood pressure 160/96, heart rate 71, RR 18, oxygen saturation 99% on room air.  Negative chest x-ray. X-ray showed mildly displaced acute right distal femur fracture.  Patient is admitted to telemetry bed as inpatient.  Dr. Roland Rack of Ortho and Dr. Candiss Norse of renal are consulted.   X-ray of pelvis: Unusual orientation of the bones of the pelvic ring. RIGHT pubic bone is more enface and RIGHT iliac shows a more open appearance than expected. Perhaps this is somehow related to projection. A repeat pelvic radiograph ensuring the patient is adequately positioned is  suggested. If there is high suspicion for pelvic injury a CT may be warranted in this instance.   EKG: I have personally reviewed.  Atrial fibrillation, QTc 432 T wave inversion in lead I/aVL and V2  6/16: Patient was taken to the OR by orthopedic surgery, s/p retrograde intramedullary nailing of closed displaced extra-articular supracondylar right femur fracture. Per orthopedic recommendation patient will be nonweightbearing to the right leg, right knee brace was placed which he can unlock and avoid to flexion up to 90 degree. Awaiting PT/OT evaluation.  6/17: Labs with hemoglobin decreased to 9.8 after the surgery.  Started on supplement.  PT recommending SNF.  6/18: No acute overnight.  Further decrease of hemoglobin to 8.9.  Continuing iron supplement and monitoring.  Awaiting SNF placement   Assessment and Plan: * Closed fracture of right distal femur [facility]) Patient was taken to the OR for retrograde intramedullary nail placement on 6/15. Tolerated the procedure well.  He will be nonweightbearing until recommended by orthopedic surgery.  Right knee brace was in place. PT is recommending SNF -Continue with pain management -Continue with supportive care  Fall at home, initial encounter -PT/OT when able to   ESRD on dialysis [facility]) Patient had schedule on MWF, no missed dialysis. -Continue dialysis per nephrology  Chronic diastolic CHF (congestive heart failure) ([place]) Okay patient has asymmetric leg edema, right leg is worse than the left.  Patient had negative lower extremity venous Doppler for DVT recently.  No shortness breath or chest pain.  No pulm edema  chest x-ray.  CHF seems to be compensated.  2D echo on 05/31/2021 showed EF> 55%. -Volume management per renal by dialysis  HTN (hypertension) - IV hydralazine as needed -Amlodipine, Coreg -Patient is also on torsemide  HLD (hyperlipidemia) - Lipitor  Type II diabetes mellitus with renal manifestations (HCC) Recent A1c  5.6, well controlled.  Patient is taking NovoLog and Lantus 8 units daily -Sliding scale insulin -Glargine insulin 4 units daily  Atrial fibrillation, chronic (HCC) Heart rate is 71.  -Continue Eliquis -Continue Coreg  Hypokalemia Resolved. -Continue to monitor     Subjective: Patient was complaining of significant right leg pain, did not stop working with PT. He received his pain meds couple of minutes ago before the morning rounds.  Pain got worse with physical therapy  Physical Exam: Vitals:   02/05/22 1611 02/05/22 2000 02/06/22 0320 02/06/22 0800  BP: (!) 127/93 127/86 110/77 124/87  Pulse: 77 72 68 72  Resp: '17 16 16 18  '$ Temp: 99.2 F (37.3 C) 99.3 F (37.4 C) 98.3 F (36.8 C) 98.2 F (36.8 C)  TempSrc:    Oral  SpO2: 100% 100% 97% 100%  Weight:      Height:       General.  Well-developed gentleman, in no acute distress. Pulmonary.  Lungs clear bilaterally, normal respiratory effort. CV.  Regular rate and rhythm, no JVD, rub or murmur. Abdomen.  Soft, nontender, nondistended, BS positive. CNS.  Alert and oriented .  No focal neurologic deficit. Extremities.  2+ RLE edema, no cyanosis, pulses intact and symmetrical. Psychiatry.  Judgment and insight appears normal.  Data Reviewed: Notes and labs reviewed  Family Communication: Discussed with patient  Disposition: Status is: Inpatient Remains inpatient appropriate because: Awaiting SNF placement   Planned Discharge Destination: Skilled nursing facility  DVT prophylaxis: Eliquis Time spent: 40 minutes  This record has been created using Systems analyst. Errors have been sought and corrected,but may not always be located. Such creation errors do not reflect on the standard of care.  Author: Lorella Nimrod, MD 02/06/2022 12:20 PM  For on call review [email or web address].

## 2022-02-07 DIAGNOSIS — I5032 Chronic diastolic (congestive) heart failure: Secondary | ICD-10-CM | POA: Diagnosis not present

## 2022-02-07 DIAGNOSIS — I482 Chronic atrial fibrillation, unspecified: Secondary | ICD-10-CM | POA: Diagnosis not present

## 2022-02-07 DIAGNOSIS — N186 End stage renal disease: Secondary | ICD-10-CM | POA: Diagnosis not present

## 2022-02-07 DIAGNOSIS — S72401A Unspecified fracture of lower end of right femur, initial encounter for closed fracture: Secondary | ICD-10-CM | POA: Diagnosis not present

## 2022-02-07 LAB — CBC
HCT: 30.9 % — ABNORMAL LOW (ref 39.0–52.0)
Hemoglobin: 9.8 g/dL — ABNORMAL LOW (ref 13.0–17.0)
MCH: 30.6 pg (ref 26.0–34.0)
MCHC: 31.7 g/dL (ref 30.0–36.0)
MCV: 96.6 fL (ref 80.0–100.0)
Platelets: 147 10*3/uL — ABNORMAL LOW (ref 150–400)
RBC: 3.2 MIL/uL — ABNORMAL LOW (ref 4.22–5.81)
RDW: 15.7 % — ABNORMAL HIGH (ref 11.5–15.5)
WBC: 5.7 10*3/uL (ref 4.0–10.5)
nRBC: 0 % (ref 0.0–0.2)

## 2022-02-07 LAB — GLUCOSE, CAPILLARY: Glucose-Capillary: 103 mg/dL — ABNORMAL HIGH (ref 70–99)

## 2022-02-07 MED ORDER — HEPARIN SODIUM (PORCINE) 1000 UNIT/ML IJ SOLN
INTRAMUSCULAR | Status: AC
  Filled 2022-02-07: qty 10

## 2022-02-07 NOTE — Progress Notes (Signed)
[facility]  ROUNDING NOTE   Subjective:   Micheal Aguirre is a 66 male with past medical history of hypertension, diabetes, GERD, atrial fib, and end stage renal disease on hemodialysis. Patient presents to the ED after a fall and right leg pain. Patient was admitted for Closed left hip fracture ([place]) [S72.002A] Closed fracture of distal end of right femur, unspecified fracture morphology, initial encounter [facility]) [S72.401A]  Patient is known to our practice from previous admission. He receives outpatient dialysis treatments at [facility] on a MWF schedule.   Patient seen and evaluated during dialysis   HEMODIALYSIS FLOWSHEET:  Blood Flow Rate (mL/min): 400 mL/min Arterial Pressure (mmHg): -180 mmHg Venous Pressure (mmHg): 140 mmHg Transmembrane Pressure (mmHg): 60 mmHg Ultrafiltration Rate (mL/min): 570 mL/min Dialysate Flow Rate (mL/min): 600 ml/min Conductivity: 13.9 Conductivity: 13.9 Dialysis Fluid Bolus: Normal Saline Bolus Amount (mL): 250 mL  Patient currently sitting in chair receiving treatment, tolerating well Patient remains pleasantly confused   Objective:  Vital signs in last 24 hours:  Temp:  [98.1 F (36.7 C)-99.1 F (37.3 C)] 98.7 F (37.1 C) (06/19 0832) Pulse Rate:  [66-96] 67 (06/19 1130) Resp:  [0-18] 11 (06/19 1130) BP: (110-146)/(71-97) 137/97 (06/19 1130) SpO2:  [92 %-100 %] 97 % (06/19 1130)  Weight change:  Filed Weights   02/03/22 1719 02/04/22 0957 02/04/22 1303  Weight: 81.6 kg 88.3 kg 86.4 kg    Intake/Output: I/O last 3 completed shifts: In: 960 [P.O.:960] Out: 1900 [Urine:1900]   Intake/Output this shift:  No intake/output data recorded.  Physical Exam: General: NAD, resting comfortably  Head: Normocephalic, atraumatic. Moist oral mucosal membranes  Eyes: Anicteric  Lungs:  Clear to auscultation, normal effort  Heart: Irregular rate and rhythm  Abdomen:  Soft, nontender  Extremities: Right leg in brace   Neurologic: Alert, able to answer simple questions appropriately.  Skin: No lesions  Access: Rt Permcath    Basic Metabolic Panel: Recent Labs  Lab 02/03/22 1209 02/03/22 1300 02/04/22 0342 02/05/22 0437  NA 142  --  141 135  K 3.1*  --  3.2* 3.6  CL 105  --  106 99  CO2 29  --  23 26  GLUCOSE 106*  --  240* 102*  BUN 37*  --  51* 37*  CREATININE 3.71*  --  4.26* 3.62*  CALCIUM 8.4*  --  7.4* 7.7*  MG  --  2.1  --   --      Liver Function Tests: Recent Labs  Lab 02/03/22 1209  AST 25  ALT 28  ALKPHOS 117  BILITOT 0.7  PROT 6.9  ALBUMIN 3.7    No results for input(s): "LIPASE", "AMYLASE" in the last 168 hours. No results for input(s): "AMMONIA" in the last 168 hours.  CBC: Recent Labs  Lab 02/03/22 1207 02/05/22 0437 02/06/22 0418 02/07/22 0700  WBC 5.7 7.9 5.5 5.7  NEUTROABS 4.8  --   --   --   HGB 12.2* 9.8* 8.9* 9.8*  HCT 39.4 31.5* 28.4* 30.9*  MCV 101.0* 97.8 98.3 96.6  PLT 125* 105* 101* 147*     Cardiac Enzymes: No results for input(s): "CKTOTAL", "CKMB", "CKMBINDEX", "TROPONINI" in the last 168 hours.  BNP: Invalid input(s): "POCBNP"  CBG: Recent Labs  Lab 02/06/22 0828 02/06/22 1204 02/06/22 1656 02/06/22 2114 02/07/22 0833  GLUCAP 108* 138* 160* 155* 103*     Microbiology: Results for orders placed or performed during the hospital encounter of 11/26/21  Resp Panel by  RT-PCR (Flu A&B, Covid) Nasopharyngeal Swab     Status: None   Collection Time: 11/26/21  6:07 PM   Specimen: Nasopharyngeal Swab; Nasopharyngeal(NP) swabs in vial transport medium  Result Value Ref Range Status   SARS Coronavirus 2 by RT PCR NEGATIVE NEGATIVE Final    Comment: (NOTE) SARS-CoV-2 target nucleic acids are NOT DETECTED.  The SARS-CoV-2 RNA is generally detectable in upper respiratory specimens during the acute phase of infection. The lowest concentration of SARS-CoV-2 viral copies this assay can detect is 138 copies/mL. A negative result does  not preclude SARS-Cov-2 infection and should not be used as the sole basis for treatment or other patient management decisions. A negative result may occur with  improper specimen collection/handling, submission of specimen other than nasopharyngeal swab, presence of viral mutation(s) within the areas targeted by this assay, and inadequate number of viral copies(<138 copies/mL). A negative result must be combined with clinical observations, patient history, and epidemiological information. The expected result is Negative.  Fact Sheet for Patients:  EntrepreneurPulse.com.au  Fact Sheet for Healthcare Providers:  IncredibleEmployment.be  This test is no t yet approved or cleared by the [place] FDA and  has been authorized for detection and/or diagnosis of SARS-CoV-2 by FDA under an Emergency Use Authorization (EUA). This EUA will remain  in effect (meaning this test can be used) for the duration of the COVID-19 declaration under Section 564(b)(1) of the Act, 21 U.S.C.section 360bbb-3(b)(1), unless the authorization is terminated  or revoked sooner.       Influenza A by PCR NEGATIVE NEGATIVE Final   Influenza B by PCR NEGATIVE NEGATIVE Final    Comment: (NOTE) The Xpert Xpress SARS-CoV-2/FLU/RSV plus assay is intended as an aid in the diagnosis of influenza from Nasopharyngeal swab specimens and should not be used as a sole basis for treatment. Nasal washings and aspirates are unacceptable for Xpert Xpress SARS-CoV-2/FLU/RSV testing.  Fact Sheet for Patients: EntrepreneurPulse.com.au  Fact Sheet for Healthcare Providers: IncredibleEmployment.be  This test is not yet approved or cleared by the [place] FDA and has been authorized for detection and/or diagnosis of SARS-CoV-2 by FDA under an Emergency Use Authorization (EUA). This EUA will remain in effect (meaning this test can be used) for the  duration of the COVID-19 declaration under Section 564(b)(1) of the Act, 21 U.S.C. section 360bbb-3(b)(1), unless the authorization is terminated or revoked.  Performed at [facility], [address], [place], [place] [address]   Surgical PCR screen     Status: None   Collection Time: 11/27/21  6:49 AM   Specimen: Nasal Mucosa; Nasal Swab  Result Value Ref Range Status   MRSA, PCR NEGATIVE NEGATIVE Final   Staphylococcus aureus NEGATIVE NEGATIVE Final    Comment: (NOTE) The Xpert SA Assay (FDA approved for NASAL specimens in patients 46 years of age and older), is one component of a comprehensive surveillance program. It is not intended to diagnose infection nor to guide or monitor treatment. Performed at [facility], [address] [place], [place] [address]     Coagulation Studies: No results for input(s): "LABPROT", "INR" in the last 72 hours.   Urinalysis: No results for input(s): "COLORURINE", "LABSPEC", "PHURINE", "GLUCOSEU", "HGBUR", "BILIRUBINUR", "KETONESUR", "PROTEINUR", "UROBILINOGEN", "NITRITE", "LEUKOCYTESUR" in the last 72 hours.  Invalid input(s): "APPERANCEUR"    Imaging: No results found.   Medications:      amLODipine  5 mg Oral Daily   apixaban  2.5 mg Oral BID   aspirin EC  81 mg Oral BID   atorvastatin  80 mg Oral QHS   bisacodyl  10 mg Oral Daily   calcium carbonate  500 mg Oral BID WC   carvedilol  25 mg Oral BID WC   Chlorhexidine Gluconate Cloth  6 each Topical Q0600   docusate sodium  100 mg Oral BID   ferrous [id number] C-folic acid  1 capsule Oral BID PC   gabapentin  100 mg Oral QHS   insulin aspart  0-5 Units Subcutaneous QHS   insulin aspart  0-9 Units Subcutaneous TID WC   insulin glargine-yfgn  4 Units Subcutaneous QHS   melatonin  5 mg Oral QHS   multivitamin  1 tablet Oral QHS   pantoprazole  40 mg Oral Q12H   polyethylene glycol  17 g Oral QHS   predniSONE  10 mg Oral Q breakfast    senna-docusate  2 tablet Oral BID   simethicone  80 mg Oral Q6H   tamsulosin  0.4 mg Oral QHS   torsemide  20 mg Oral BID   Vitamin D (Ergocalciferol)  50,000 Units Oral Q Sat   acetaminophen, bisacodyl, diphenhydrAMINE, hydrALAZINE, magnesium hydroxide, methocarbamol, metoCLOPramide **OR** metoCLOPramide (REGLAN) injection, morphine injection, ondansetron **OR** ondansetron (ZOFRAN) IV, oxybutynin, oxyCODONE, senna-docusate, sodium phosphate  Assessment/ Plan:  [name] is a 66 y.o.  male with past medical history of hypertension, diabetes, GERD, atrial fib, and end stage renal disease on hemodialysis. Patient presents to the ED after a fall and right leg pain. Patient was admitted for Closed left hip fracture ([place]) [S72.002A] Closed fracture of distal end of right femur, unspecified fracture morphology, initial encounter ([place]) [S72.401A]  [facility]/MWF/Rt Permcath  End stage renal disease on hemodialysis with hypokalemia . Will maintain outpatient schedule if possible.   Patient receiving dialysis today, seated in chair.  Tolerating treatment well.  Discharge plan to include rehab.  2. Anemia of chronic kidney disease Lab Results  Component Value Date   HGB 9.8 (L) 02/07/2022    Hemoglobin at goal.  We will continue to monitor.  3. Secondary Hyperparathyroidism:  Lab Results  Component Value Date   CALCIUM 7.7 (L) 02/05/2022   PHOS 2.9 12/02/2021    Calcium not within acceptable range.  Continue calcium carbonate and vitamin D supplementation.  4. Diabetes mellitus type II with chronic kidney disease  insulin dependent. Home regimen includes Lantus and NovoLog. Most recent hemoglobin A1c is 5.6 on 11/27/21.   5.  Closed fracture of the right distal femur.  ORIF completed on 6/15.  Ortho continues to follow.  Pain management, PT/OT. Awaiting financial clearance for [employer].   LOS: 4 [place] 6/19/202311:43 AM

## 2022-02-07 NOTE — Progress Notes (Signed)
Hemodialysis Post Treatment Note 07 February 2022 Access: Right Subclavian UF: Removed: 1002 ml BFR: 400   Next Scheduled Treatment: 02/09/22 Note: Patient presents to hemodialysis without concerns. CVC intact, dressing changed. Targeted UF reached with 1002 ml fluid removed, BFR maintained during the course of treatment. Patient tolerated treatment without incident, stable post treatment, returned to assigned unit.

## 2022-02-07 NOTE — Discharge Summary (Signed)
Physician Discharge Summary   Patient: Micheal Aguirre MRN: [id number] DOB: 08/20/1956  Admit date:     02/03/2022  Discharge date: 02/07/22  Discharge Physician: Lorella Nimrod   PCP: Pcp, No   Recommendations at discharge:  Please obtain CBC and BMP in 1 week Continue with scheduled dialysis Follow-up with orthopedic surgery in 2 weeks Patient will be nonweightbearing on right lower extremity until cleared from orthopedic surgery. Patient is on twice daily aspirin 325 mg for DVT prophylaxis after orthopedic surgery.  Discharge Diagnoses: Principal Problem:   Closed fracture of right distal femur [facility]) Active Problems:   Fall at home, initial encounter   ESRD on dialysis ([facility])   Chronic diastolic CHF (congestive heart failure) (HCC)   HTN (hypertension)   HLD (hyperlipidemia)   Type II diabetes mellitus with renal manifestations (HCC)   Atrial fibrillation, chronic ([place])   Hypokalemia  Hospital Course: Taken from H&P.  Micheal Aguirre is a 66 y.o. male with medical history significant of ESRD-HD (MWF), hypertension, hyperlipidemia, diabetes mellitus, GERD, dCHF, A-fib not on anticoagulants, prostate cancer, who presents with fall and right leg pain.    Pt accidentally fell forward at about 2:00 PM when he was sitting in chair, trying to pick up a rock and lost his balance.  He injured his right leg above knee area, causing pain, which is constant, sharp, severe, nonradiating, aggravated by movement.  No loss of consciousness.  He strongly denies any head or neck injury.  No headache or neck pain.  Refused CT scan of head and neck. Patient does not have chest pain, cough, shortness breath.  No nausea, vomiting, diarrhea or abdominal pain.  No symptoms of UTI.  Patient states that he had dialysis yesterday.   Of note, pt has asymmetric leg edema. He underwent ORIF of right lateral tibial plateau fracture on 11/27/2021. Right leg is worse than the left.  Patient had lower extremity venous Doppler  for right leg which was negative for DVT on 01/26/2022.  Data Reviewed and ED Course: pt was found to have WBC 5.7, potassium 3.1, bicarbonate 29, creatinine 3.71, BUN 37, temperature normal, blood pressure 160/96, heart rate 71, RR 18, oxygen saturation 99% on room air.  Negative chest x-ray. X-ray showed mildly displaced acute right distal femur fracture.  Patient is admitted to telemetry bed as inpatient.  Dr. Roland Rack of Ortho and Dr. Candiss Norse of renal are consulted.   X-ray of pelvis: Unusual orientation of the bones of the pelvic ring. RIGHT pubic bone is more enface and RIGHT iliac shows a more open appearance than expected. Perhaps this is somehow related to projection. A repeat pelvic radiograph ensuring the patient is adequately positioned is suggested. If there is high suspicion for pelvic injury a CT may be warranted in this instance.   EKG: I have personally reviewed.  Atrial fibrillation, QTc 432 T wave inversion in lead I/aVL and V2  6/16: Patient was taken to the OR by orthopedic surgery, s/p retrograde intramedullary nailing of closed displaced extra-articular supracondylar right femur fracture. Per orthopedic recommendation patient will be nonweightbearing to the right leg, right knee brace was placed which he can unlock and avoid to flexion up to 90 degree. Awaiting PT/OT evaluation.  6/17: Labs with hemoglobin decreased to 9.8 after the surgery.  Started on supplement.  PT recommending SNF.  6/18: No acute overnight.  Further decrease of hemoglobin to 8.9.  Continuing iron supplement and monitoring.  Awaiting SNF placement.  6/19: Patient remained stable.  Had  his dialysis today.  Hemoglobin improved to 9.8.  He will continue his iron supplement and current medications and follow-up with his providers for further recommendations.  Patient will be nonweightbearing on right lower extremity until cleared from orthopedic surgery.  Assessment and Plan: * Closed fracture of right  distal femur [facility]) Patient was taken to the OR for retrograde intramedullary nail placement on 6/15. Tolerated the procedure well.  He will be nonweightbearing until recommended by orthopedic surgery.  Right knee brace was in place. PT is recommending SNF -Continue with pain management -Continue with supportive care  Fall at home, initial encounter -PT/OT when able to   ESRD on dialysis [facility]) Patient had schedule on MWF, no missed dialysis. -Continue dialysis per nephrology  Chronic diastolic CHF (congestive heart failure) ([place]) Okay patient has asymmetric leg edema, right leg is worse than the left.  Patient had negative lower extremity venous Doppler for DVT recently.  No shortness breath or chest pain.  No pulm edema chest x-ray.  CHF seems to be compensated.  2D echo on 05/31/2021 showed EF> 55%. -Volume management per renal by dialysis  HTN (hypertension) - IV hydralazine as needed -Amlodipine, Coreg -Patient is also on torsemide  HLD (hyperlipidemia) - Lipitor  Type II diabetes mellitus with renal manifestations (HCC) Recent A1c 5.6, well controlled.  Patient is taking NovoLog and Lantus 8 units daily -Sliding scale insulin -Glargine insulin 4 units daily  Atrial fibrillation, chronic (HCC) Heart rate is 71.  -Continue Eliquis -Continue Coreg  Hypokalemia Resolved. -Continue to monitor    Pain control - [facility] Controlled Substance Reporting System database was reviewed. and patient was instructed, not to drive, operate heavy machinery, perform activities at heights, swimming or participation in water activities or provide baby-sitting services while on Pain, Sleep and Anxiety Medications; until their outpatient Physician has advised to do so again. Also recommended to not to take more than prescribed Pain, Sleep and Anxiety Medications.  Consultants: Orthopedic surgery, nephrology Procedures performed: ORIF of right distal femur Disposition: Skilled nursing  facility Diet recommendation:  Discharge Diet Orders (From admission, onward)     Start     Ordered   02/07/22 0000  Diet - low sodium heart healthy        02/07/22 1320           Cardiac and Carb modified diet DISCHARGE MEDICATION: Allergies as of 02/07/2022       Reactions   Hydralazine    Other reaction(s): Kidney Disorder, Other (See Comments) Concern for drug-induced AIN in 06/2021   Lisinopril Swelling, Other (See Comments)   Angioedema   Bimatoprost    Other reaction(s): Unknown   Empagliflozin Other (See Comments)   UTI Other reaction(s): uti        Medication List     STOP taking these medications    folic acid 1 MG tablet Commonly known as: FOLVITE   vitamin B-12 500 MCG tablet Commonly known as: CYANOCOBALAMIN       TAKE these medications    acetaminophen 500 MG tablet Commonly known as: TYLENOL Take 1,000 mg by mouth every 8 (eight) hours as needed for mild pain or moderate pain.   amLODipine 5 MG tablet Commonly known as: NORVASC Take 5 mg by mouth daily.   apixaban 2.5 MG Tabs tablet Commonly known as: ELIQUIS Take 1 tablet (2.5 mg total) by mouth 2 (two) times daily.   aspirin EC 81 MG tablet Take 81 mg by mouth 2 (two) times daily.  Swallow whole.   atorvastatin 80 MG tablet Commonly known as: LIPITOR Take 80 mg by mouth daily.   bisacodyl 5 MG EC tablet Commonly known as: DULCOLAX Take 10 mg by mouth daily.   calcium carbonate 500 MG chewable tablet Commonly known as: TUMS - dosed in mg elemental calcium Chew 500 mg by mouth 2 (two) times daily.   carvedilol 25 MG tablet Commonly known as: COREG Take 25 mg by mouth 2 (two) times daily with a meal.   ferrous [id number] C-folic acid capsule Commonly known as: TRINSICON / FOLTRIN Take 1 capsule by mouth 2 (two) times daily after a meal.   gabapentin 100 MG capsule Commonly known as: NEURONTIN Take 100 mg by mouth at bedtime.   Lantus SoloStar 100 UNIT/ML  Solostar Pen Generic drug: insulin glargine Inject 8 Units into the skin at bedtime. HOLD FOR BLOOD SUGAR LESS THAN 120   melatonin 3 MG Tabs tablet Take 6 mg by mouth at bedtime.   methocarbamol 500 MG tablet Commonly known as: ROBAXIN Take 1 tablet (500 mg total) by mouth every 6 (six) hours as needed for muscle spasms.   multivitamin with minerals tablet Take 1 tablet by mouth every evening.   NovoLOG FlexPen 100 UNIT/ML FlexPen Generic drug: insulin aspart Inject 4 Units into the skin daily with lunch.   oxybutynin 5 MG tablet Commonly known as: DITROPAN Take 5 mg by mouth every 8 (eight) hours as needed for bladder spasms.   oxyCODONE 5 MG immediate release tablet Commonly known as: Oxy IR/ROXICODONE Take 1-2 tablets (5-10 mg total) by mouth every 4 (four) hours as needed for moderate pain (pain score 4-6). What changed:  how much to take when to take this reasons to take this   pantoprazole 40 MG tablet Commonly known as: PROTONIX Take 40 mg by mouth every 12 (twelve) hours.   polyethylene glycol 17 g packet Commonly known as: MIRALAX / GLYCOLAX Take 17 g by mouth at bedtime.   predniSONE 10 MG tablet Commonly known as: DELTASONE Take 10 mg by mouth daily with breakfast.   senna-docusate 8.6-50 MG tablet Commonly known as: Senokot-S Take 2 tablets by mouth 2 (two) times daily.   simethicone 80 MG chewable tablet Commonly known as: MYLICON Chew 80 mg by mouth every 6 (six) hours.   tamsulosin 0.4 MG Caps capsule Commonly known as: FLOMAX Take 0.4 mg by mouth at bedtime.   torsemide 20 MG tablet Commonly known as: DEMADEX Take 20 mg by mouth 2 (two) times daily.   Vitamin D (Ergocalciferol) 1.25 MG (50000 UNIT) Caps capsule Commonly known as: DRISDOL Take 50,000 Units by mouth every Saturday.   zinc oxide 20 % ointment Apply 1 application. topically every 8 (eight) hours as needed (peri-skin care).               Discharge Care Instructions   (From admission, onward)           Start     Ordered   02/07/22 0000  Leave dressing on - Keep it clean, dry, and intact until clinic visit        02/07/22 1320            Follow-up Information     Lattie Corns, PA-C Follow up in 2 week(s).   Specialty: Physician Assistant Contact information: [place] [address] [phone]                Discharge Exam: Danley Danker Weights  02/03/22 1719 02/04/22 0957 02/04/22 1303  Weight: 81.6 kg 88.3 kg 86.4 kg   General.     In no acute distress. Pulmonary.  Lungs clear bilaterally, normal respiratory effort. CV.  Regular rate and rhythm, no JVD, rub or murmur. Abdomen.  Soft, nontender, nondistended, BS positive. CNS.  Alert and oriented .  No focal neurologic deficit. Extremities.  No edema, no cyanosis, pulses intact and symmetrical.  Right lower extremity with splint and knee brace. Psychiatry.  Judgment and insight appears normal.   Condition at discharge: stable  The results of significant diagnostics from this hospitalization (including imaging, microbiology, ancillary and laboratory) are listed below for reference.   Imaging Studies: DG FEMUR, MIN 2 VIEWS RIGHT  Result Date: 02/03/2022 CLINICAL DATA:  Fracture of the right femur. EXAM: RIGHT FEMUR 2 VIEWS COMPARISON:  Earlier radiograph dated 02/03/2022. FINDINGS: Four intraoperative fluoroscopic spot images provided. The total fluoroscopic time is 1 minute 46 seconds. Status post ORIF of femoral fracture. IMPRESSION: Status post ORIF of the femoral fracture. Electronically Signed   By: Anner Crete M.D.   On: 02/03/2022 21:17   DG C-Arm 1-60 Min-No Report  Result Date: 02/03/2022 Fluoroscopy was utilized by the requesting physician.  No radiographic interpretation.   DG C-Arm 1-60 Min-No Report  Result Date: 02/03/2022 Fluoroscopy was utilized by the requesting physician.  No radiographic interpretation.   DG C-Arm 1-60  Min-No Report  Result Date: 02/03/2022 Fluoroscopy was utilized by the requesting physician.  No radiographic interpretation.   [place] OR NERVE BLOCK-IMAGE ONLY [facility])  Result Date: 02/03/2022 There is no interpretation for this exam.  This order is for images obtained during a surgical procedure.  Please See "Surgeries" Tab for more information regarding the procedure.   DG FEMUR PORT MIN 2 VIEWS LEFT  Addendum Date: 02/03/2022   ADDENDUM REPORT: 02/03/2022 13:31 ADDENDUM: Images and report applied to the RIGHT femur. There is a comminuted, overriding and displaced fracture of the RIGHT femoral metadiaphysis rather than the LEFT. Electronically Signed   By: Zetta Bills M.D.   On: 02/03/2022 13:31   Result Date: 02/03/2022 CLINICAL DATA:  Knee pain following fall, tibial plateau surgery 10 weeks ago. EXAM: LEFT FEMUR PORTABLE 2 VIEWS COMPARISON:  Pelvic evaluation of the same date. FINDINGS: Mildly comminuted fracture with moderate displacement and over riding of the distal LEFT femoral metadiaphysis. There is posterior and medial displacement of the distal fracture fragment by up to 1 cm with approximately 1-2 cm overlap/over riding. Signs of prior ORIF of the RIGHT tibial plateau are demonstrated. No additional fractures are identified. IMPRESSION: Mildly comminuted, displaced and over riding fracture of the distal LEFT femoral metadiaphysis as described. No additional fractures are seen. Electronically Signed: By: Zetta Bills M.D. On: 02/03/2022 12:24   DG FEMUR, MIN 2 VIEWS RIGHT  Result Date: 02/03/2022 CLINICAL DATA:  Right knee pain after a fall today. EXAM: RIGHT FEMUR 2 VIEWS COMPARISON:  Right knee radiographs 11/27/2021 FINDINGS: There is an acute, oblique, mildly comminuted fracture of the distal shaft of the femur which demonstrates mild medial and posterior displacement. The knee and hip are located. Prior ORIF of the proximal tibia and a remote posttraumatic deformity of the proximal  fibula are again noted. Soft tissue swelling and atherosclerotic vascular calcifications are noted. IMPRESSION: Mildly displaced acute distal femur fracture. Electronically Signed   By: Logan Bores M.D.   On: 02/03/2022 13:21   DG Pelvis 1-2 Views  Result Date: 02/03/2022 CLINICAL DATA:  A  67 year old male presents for evaluation of hip pain and knee pain following fall. EXAM: PELVIS - 1-2 VIEW COMPARISON:  Femoral evaluation of the same date. FINDINGS: Osteopenia. Images mildly rotated. The orientation of the iliac bones is disparate with that of the pubic bones accounting for rotation, it is unclear whether this is projectional. Based on orientation of the pubic bone on the RIGHT in the iliac bone on the RIGHT it would be difficult to exclude the possibility of pelvic ring injury. Recommend attempt at repeat imaging or CT for further assessment. . IMPRESSION: Unusual orientation of the bones of the pelvic ring. RIGHT pubic bone is more enface and RIGHT iliac shows a more open appearance than expected. Perhaps this is somehow related to projection. A repeat pelvic radiograph ensuring the patient is adequately positioned is suggested. If there is high suspicion for pelvic injury a CT may be warranted in this instance. Electronically Signed   By: Zetta Bills M.D.   On: 02/03/2022 12:39   DG Chest Portable 1 View  Result Date: 02/03/2022 CLINICAL DATA:  RIGHT knee pain following fall. EXAM: PORTABLE CHEST 1 VIEW COMPARISON:  None available FINDINGS: Dialysis catheter in situ tip at the upper portion of the RIGHT atrium. Post median sternotomy for CABG. Cardiomediastinal contours and hilar structures are normal accounting for low depth of expansion with basilar atelectasis. No sign of consolidation. No pneumothorax. On limited assessment no acute skeletal findings. IMPRESSION: 1. Signs of basilar atelectasis without acute cardiopulmonary disease. 2. Dialysis catheter in situ tip at the upper portion of the  RIGHT atrium. Electronically Signed   By: Zetta Bills M.D.   On: 02/03/2022 12:30   US Venous Img Lower Unilateral Right (DVT)  Result Date: 01/26/2022 CLINICAL DATA:  Right tibial fracture, swelling for 4 weeks. EXAM: Right LOWER EXTREMITY VENOUS DOPPLER ULTRASOUND TECHNIQUE: Gray-scale sonography with compression, as well as color and duplex ultrasound, were performed to evaluate the deep venous system(s) from the level of the common femoral vein through the popliteal and proximal calf veins. COMPARISON:  None. FINDINGS: VENOUS Normal compressibility of the common femoral, superficial femoral, and popliteal veins, as well as the visualized calf veins. Visualized portions of profunda femoral vein and great saphenous vein unremarkable. No filling defects to suggest DVT on grayscale or color Doppler imaging. Doppler waveforms show normal direction of venous flow, normal respiratory plasticity and response to augmentation. Limited views of the contralateral common femoral vein are unremarkable. OTHER Edema seen in the thigh and calf. Limitations: None. IMPRESSION: Negative for right lower extremity deep venous thrombosis. Electronically Signed   By: Lorin Picket M.D.   On: 01/26/2022 16:31    Microbiology: Results for orders placed or performed during the hospital encounter of 11/26/21  Resp Panel by RT-PCR (Flu A&B, Covid) Nasopharyngeal Swab     Status: None   Collection Time: 11/26/21  6:07 PM   Specimen: Nasopharyngeal Swab; Nasopharyngeal(NP) swabs in vial transport medium  Result Value Ref Range Status   SARS Coronavirus 2 by RT PCR NEGATIVE NEGATIVE Final    Comment: (NOTE) SARS-CoV-2 target nucleic acids are NOT DETECTED.  The SARS-CoV-2 RNA is generally detectable in upper respiratory specimens during the acute phase of infection. The lowest concentration of SARS-CoV-2 viral copies this assay can detect is 138 copies/mL. A negative result does not preclude SARS-Cov-2 infection and  should not be used as the sole basis for treatment or other patient management decisions. A negative result may occur with  improper specimen collection/handling,  submission of specimen other than nasopharyngeal swab, presence of viral mutation(s) within the areas targeted by this assay, and inadequate number of viral copies(<138 copies/mL). A negative result must be combined with clinical observations, patient history, and epidemiological information. The expected result is Negative.  Fact Sheet for Patients:  EntrepreneurPulse.com.au  Fact Sheet for Healthcare Providers:  IncredibleEmployment.be  This test is no t yet approved or cleared by the [place] FDA and  has been authorized for detection and/or diagnosis of SARS-CoV-2 by FDA under an Emergency Use Authorization (EUA). This EUA will remain  in effect (meaning this test can be used) for the duration of the COVID-19 declaration under Section 564(b)(1) of the Act, 21 U.S.C.section 360bbb-3(b)(1), unless the authorization is terminated  or revoked sooner.       Influenza A by PCR NEGATIVE NEGATIVE Final   Influenza B by PCR NEGATIVE NEGATIVE Final    Comment: (NOTE) The Xpert Xpress SARS-CoV-2/FLU/RSV plus assay is intended as an aid in the diagnosis of influenza from Nasopharyngeal swab specimens and should not be used as a sole basis for treatment. Nasal washings and aspirates are unacceptable for Xpert Xpress SARS-CoV-2/FLU/RSV testing.  Fact Sheet for Patients: EntrepreneurPulse.com.au  Fact Sheet for Healthcare Providers: IncredibleEmployment.be  This test is not yet approved or cleared by the [place] FDA and has been authorized for detection and/or diagnosis of SARS-CoV-2 by FDA under an Emergency Use Authorization (EUA). This EUA will remain in effect (meaning this test can be used) for the duration of the COVID-19 declaration  under Section 564(b)(1) of the Act, 21 U.S.C. section 360bbb-3(b)(1), unless the authorization is terminated or revoked.  Performed at [facility], [address], [place], [place] [address]   Surgical PCR screen     Status: None   Collection Time: 11/27/21  6:49 AM   Specimen: Nasal Mucosa; Nasal Swab  Result Value Ref Range Status   MRSA, PCR NEGATIVE NEGATIVE Final   Staphylococcus aureus NEGATIVE NEGATIVE Final    Comment: (NOTE) The Xpert SA Assay (FDA approved for NASAL specimens in patients 17 years of age and older), is one component of a comprehensive surveillance program. It is not intended to diagnose infection nor to guide or monitor treatment. Performed at [facility], [address] [place], [place] [address]     Labs: CBC: Recent Labs  Lab 02/03/22 1207 02/05/22 0437 02/06/22 0418 02/07/22 0700  WBC 5.7 7.9 5.5 5.7  NEUTROABS 4.8  --   --   --   HGB 12.2* 9.8* 8.9* 9.8*  HCT 39.4 31.5* 28.4* 30.9*  MCV 101.0* 97.8 98.3 96.6  PLT 125* 105* 101* 697*   Basic Metabolic Panel: Recent Labs  Lab 02/03/22 1209 02/03/22 1300 02/04/22 0342 02/05/22 0437  NA 142  --  141 135  K 3.1*  --  3.2* 3.6  CL 105  --  106 99  CO2 29  --  23 26  GLUCOSE 106*  --  240* 102*  BUN 37*  --  51* 37*  CREATININE 3.71*  --  4.26* 3.62*  CALCIUM 8.4*  --  7.4* 7.7*  MG  --  2.1  --   --    Liver Function Tests: Recent Labs  Lab 02/03/22 1209  AST 25  ALT 28  ALKPHOS 117  BILITOT 0.7  PROT 6.9  ALBUMIN 3.7   CBG: Recent Labs  Lab 02/06/22 0828 02/06/22 1204 02/06/22 1656 02/06/22 2114 02/07/22 0833  GLUCAP 108* 138* 160*  155* 103*    Discharge time spent: greater than 30 minutes.  This record has been created using Systems analyst. Errors have been sought and corrected,but may not always be located. Such creation errors do not reflect on the standard of care.   Signed: Lorella Nimrod, MD [facility] 02/07/2022

## 2022-02-07 NOTE — Progress Notes (Signed)
Physical Therapy Treatment Patient Details Name: Micheal Aguirre MRN: [id number] DOB: 1956/08/02 Today's Date: 02/07/2022   History of Present Illness 66 y.o. male with medical history significant for ESRD on HD MWF, hypertension, type 2 diabetes, hyperlipidemia, coronary artery disease status post CABG, HFrEF 35%, duodenal ulcers, history of GI bleed, prostate cancer, with metastasis to bone, left renal mass, R tibial plateau fracture s/p ORIF (11/27/21) who presented to [facility] ED via EMS after a fall with acute onset of R LE pain; admitted for management of R distal femur fracture, s/p ORIF (02/03/22), NWB    PT Comments    Pt in chair.  Reports sleeping in it again last night and has not stood since last therapy session yesterday.  Again explained need for positional changes and encouraged back to bed at times to prevent pressure issues.  He voiced understanding.  Pt would benefit from pressure relief cushion upon transition to SNF bed as this may be the pattern at another facility.  He is not complaining of pain or pressure issues at this time.  Stood with mod a x 2 to stand and up x 1 minute with some decreased balance today requiring min a x 2 due to unsteadiness.  Limited standing AROM RLE to ensure NWB.  Fatigued quicker today and requested to sit.  Seated AROM R knee flex/ext.  Pt awaiting transport to dialysis so session limited.   Recommendations for follow up therapy are one component of a multi-disciplinary discharge planning process, led by the attending physician.  Recommendations may be updated based on patient status, additional functional criteria and insurance authorization.  Follow Up Recommendations  Skilled nursing-short term rehab (<3 hours/day)     Assistance Recommended at Discharge Frequent or constant Supervision/Assistance  Patient can return home with the following A lot of help with walking and/or transfers;A lot of help with bathing/dressing/bathroom;Assistance with  cooking/housework;Assistance with feeding;Direct supervision/assist for medications management;Direct supervision/assist for financial management;Assist for transportation;Help with stairs or ramp for entrance   Equipment Recommendations  Other (comment) (defer to next level of care. may need w/c,cushion, RW, and BSC)    Recommendations for Other Services       Precautions / Restrictions Precautions Precautions: Fall Required Braces or Orthoses: Other Brace (ROM hinged knee brace) Knee Immobilizer - Right: Other (comment) (ok to unlock ROM knee brace with ROM up to 90 degrees with PT.) Restrictions Weight Bearing Restrictions: Yes RLE Weight Bearing: Non weight bearing     Mobility  Bed Mobility               General bed mobility comments: in chair before and after    Transfers Overall transfer level: Needs assistance Equipment used: Rolling walker (2 wheels) Transfers: Sit to/from Stand Sit to Stand: Min assist, Mod assist, +2 physical assistance           General transfer comment: stood from [facility] with mod a x 2 to stand but once up min a x 2 for balance/safety.  stood x 1 minute    Ambulation/Gait               General Gait Details: unable to "hop" to progress to ambulation   Stairs             Wheelchair Mobility    Modified Rankin (Stroke Patients Only)       Balance Overall balance assessment: Needs assistance Sitting-balance support: No upper extremity supported, Feet supported Sitting balance-Leahy Scale: Good     Standing balance  support: Bilateral upper extremity supported, During functional activity, Reliant on assistive device for balance Standing balance-Leahy Scale: Poor Standing balance comment: stood x 1 minute.  A bit more assist today due to wavering in standing                            Cognition Arousal/Alertness: Awake/alert Behavior During Therapy: WFL for tasks assessed/performed Overall Cognitive  Status: Within Functional Limits for tasks assessed                                          Exercises Other Exercises Other Exercises: standing and seated AROM - knee flex and ext    General Comments        Pertinent Vitals/Pain Pain Assessment Pain Assessment: Faces Faces Pain Scale: Hurts whole lot Pain Location: R knee/LE Pain Descriptors / Indicators: Aching, Guarding, Grimacing Pain Intervention(s): Limited activity within patient's tolerance, Monitored during session, Repositioned    Home Living                          Prior Function            PT Goals (current goals can now be found in the care plan section) Progress towards PT goals: Progressing toward goals    Frequency    7X/week      PT Plan Current plan remains appropriate    Co-evaluation              AM-PAC PT "6 Clicks" Mobility   Outcome Measure  Help needed turning from your back to your side while in a flat bed without using bedrails?: A Little Help needed moving from lying on your back to sitting on the side of a flat bed without using bedrails?: A Lot Help needed moving to and from a bed to a chair (including a wheelchair)?: A Lot Help needed standing up from a chair using your arms (e.g., wheelchair or bedside chair)?: A Lot Help needed to walk in hospital room?: Total Help needed climbing 3-5 steps with a railing? : Total 6 Click Score: 11    End of Session   Activity Tolerance: Patient tolerated treatment well;Patient limited by pain Patient left: in chair;with call bell/phone within reach;with chair alarm set Nurse Communication: Mobility status PT Visit Diagnosis: Muscle weakness (generalized) (M62.81);Difficulty in walking, not elsewhere classified (R26.2)     Time: 6761-9509 PT Time Calculation (min) (ACUTE ONLY): 13 min  Charges:  $Therapeutic Exercise: 8-22 mins                   Chesley Noon, PTA 02/07/22, 9:49 AM

## 2022-02-07 NOTE — Care Management Important Message (Signed)
Important Message  Patient Details  Name: Micheal Aguirre MRN: [id number] Date of Birth: 1956-06-20   Medicare Important Message Given:  Yes     Juliann Pulse A Foday [facility] 02/07/2022, 2:08 PM

## 2022-02-07 NOTE — Progress Notes (Signed)
Subjective: 4 Days Post-Op Procedure(s) (LRB): OPEN REDUCTION INTERNAL FIXATION (ORIF) DISTAL FEMUR FRACTURE (Right) Patient reports pain as moderate.   Patient is well, and has had no acute complaints or problems Negative for chest pain and shortness of breath Fever: no Gastrointestinal:Negative for nausea and vomiting.  + BM PT recommending skilled nursing facility  Objective: Vital signs in last 24 hours: Temp:  [98.1 F (36.7 C)-99.1 F (37.3 C)] 99.1 F (37.3 C) (06/19 0352) Pulse Rate:  [73-96] 96 (06/19 0352) Resp:  [16-18] 16 (06/19 0352) BP: (133-144)/(75-88) 133/75 (06/19 0352) SpO2:  [99 %-100 %] 100 % (06/19 0352)  Intake/Output from previous day:  Intake/Output Summary (Last 24 hours) at 02/07/2022 0812 Last data filed at 02/07/2022 0353 Gross per 24 hour  Intake 960 ml  Output 1250 ml  Net -290 ml    Intake/Output this shift: No intake/output data recorded.  Labs: Recent Labs    02/05/22 0437 02/06/22 0418 02/07/22 0700  HGB 9.8* 8.9* 9.8*   Recent Labs    02/06/22 0418 02/07/22 0700  WBC 5.5 5.7  RBC 2.89* 3.20*  HCT 28.4* 30.9*  PLT 101* 147*   Recent Labs    02/05/22 0437  NA 135  K 3.6  CL 99  CO2 26  BUN 37*  CREATININE 3.62*  GLUCOSE 102*  CALCIUM 7.7*   No results for input(s): "LABPT", "INR" in the last 72 hours.  EXAM General - Patient is Alert and Appropriate Extremity - ABD soft Neurovascular intact Dorsiflexion/Plantar flexion intact Incision: dressing C/D/I No cellulitis present Dressing/Incision - clean, dry, no drainage to the ACE wraps applied to the right leg.  ACE wrap was removed this morning, honeycomb dressings are intact.  The most medial honeycomb was saturated, changed this morning for the patient. Motor Function - intact, moving foot and toes well on exam.  Abdomen soft with intact bowel sounds.  Moderate swelling noted to the right leg.  Negative Homans.  Past Medical History:  Diagnosis Date    Diabetes mellitus without complication (HCC)    ESRD (end stage renal disease) ([place])    Hypercholesteremia    Hypertension    Renal disorder     Assessment/Plan: 4 Days Post-Op Procedure(s) (LRB): OPEN REDUCTION INTERNAL FIXATION (ORIF) DISTAL FEMUR FRACTURE (Right) Principal Problem:   Closed fracture of right distal femur (HCC) Active Problems:   ESRD on dialysis ([place])   Chronic diastolic CHF (congestive heart failure) (HCC)   Type II diabetes mellitus with renal manifestations (HCC)   HTN (hypertension)   HLD (hyperlipidemia)   Atrial fibrillation, chronic ([place])   Fall at home, initial encounter   Hypokalemia  Estimated body mass index is 28.13 kg/m as calculated from the following:   Height as of this encounter: '5\' 9"'$  (1.753 m).   Weight as of this encounter: 86.4 kg.  Pain well controlled this morning.  Vital signs are stable.  Acute postop blood loss anemia -hemoglobin 9.8.  Continue with iron supplement and continue to monitor hemoglobin  Continue with physical therapy.  Patient is non-weightbearing to the right lower extremity, can unlock the knee brace and work to active flexion up to 90 degrees.  Honeycomb dressing changed today.  Scheduled dialysis today.  Care management to assist with discharge to skilled nursing facility  Patient will need 2-week follow-up with [facility] orthopedics  DVT Prophylaxis -  Eliquis Non-weightbearing to the right leg.  Raquel Sakai [place], PA-C [facility] 02/07/2022, 8:12 AM

## 2022-02-07 NOTE — Progress Notes (Signed)
Hemodialysis patient known at [facility] MWF 10am. Per clinic, patient resides at [facility] and is pleasantly confused at baseline. Patient stated no dialysis concerns.

## 2022-02-07 NOTE — TOC Progression Note (Signed)
Transition of Care [facility]) - Progression Note    Patient Details  Name: Rigo Letts MRN: [id number] Date of Birth: Apr 26, 1956  Transition of Care [facility]) CM/SW [place], RN Phone Number: 02/07/2022, 2:34 PM  Clinical Narrative:     Patient going to [facility], EMS called for transport, there are several ahead of him for transport  Expected Discharge Plan: [place] Barriers to Discharge: Continued Medical Work up  Expected Discharge Plan and Services Expected Discharge Plan: [place] arrangements for the past 2 months: [place] Expected Discharge Date: 02/06/22                                     Social Determinants of Health (SDOH) Interventions    Readmission Risk Interventions     No data to display

## 2022-02-08 LAB — HEPATITIS B SURFACE ANTIBODY, QUANTITATIVE: Hep B S AB Quant (Post): 12 m[IU]/mL (ref >9.9)

## 2022-02-10 NOTE — TOC Progression Note (Signed)
Transition of Care [facility]) - Progression Note    Patient Details  Name: Micheal Aguirre MRN: [id number] Date of Birth: 10-27-1955  Transition of Care [facility]) CM/SW [place], RN Phone Number: 02/10/2022, 10:12 AM  Clinical Narrative:    Attempted to contact the patient's daughter [name] at [phone] at her request.  Unable to reach, left a Voice mail requesting a call back and provided my contact information   Expected Discharge Plan: [place] Barriers to Discharge: Continued Medical Work up  Expected Discharge Plan and Services Expected Discharge Plan: [place] arrangements for the past 2 months: [place] Expected Discharge Date: 02/06/22                                     Social Determinants of Health (SDOH) Interventions    Readmission Risk Interventions     No data to display

## 2022-02-10 NOTE — TOC Progression Note (Signed)
Transition of Care [facility]) - Progression Note    Patient Details  Name: Micheal Aguirre MRN: [id number] Date of Birth: 1956-07-17  Transition of Care [facility]) CM/SW Contact  Marlowe Sax, RN Phone Number: 02/10/2022, 11:09 AM  Clinical Narrative:    I called The patient's daughter [name] again, She stated that she was not made aware of his dc plan and that he had Dementia, I explained there is no diagnosis of dementia on his chart and that we do not have a HC POA on file, she stated that we do have it, I provided her the address for the Hospital as well as a Fax number to send the [facility] POA Paperwork to be placed on his file, I explained to her that he was alert and oriented the entire hospital stay and that he chose to go to [facility] for [facility], I gave her the address to [facility] and his room number , I explained I called her on Monday and did not get an answer.  The patient stated he would let her know where he was going and he was alert and oriented. I did  not leave a message as he was going to let her know.  I explained that even if there is a HCPOA on file as long as the patient is alert and oriented, even with a diagnosis of Dementia, if there is one on file, the patient still is able to make their own choices and that he Chose to go to [facility] for STR SNF.  She was very upset and stated that she is going to file a complaint.  I explained that she can do so and provided her the number to call administration, She stated that no one called her during his stay except for the surgeon, I asked if she had called to check on her dad and speak to anyone about his plan, she stated no but we were to call her, I explained to her that he was alert and oriented during his stay and that he did not ask anyone to call her, she hung up the phone     Expected Discharge Plan: Skilled Nursing Facility Barriers to Discharge: Continued Medical Work up  Expected Discharge Plan and Services Expected Discharge Plan: Skilled Nursing  Facility       Living arrangements for the past 2 months: Assisted Living Facility Expected Discharge Date: 02/06/22                                     Social Determinants of Health (SDOH) Interventions    Readmission Risk Interventions     No data to display

## 2022-06-12 ENCOUNTER — Other Ambulatory Visit: Payer: Self-pay

## 2022-06-12 ENCOUNTER — Emergency Department: Payer: Medicare PPO

## 2022-06-12 ENCOUNTER — Emergency Department
Admission: EM | Admit: 2022-06-12 | Discharge: 2022-06-12 | Disposition: A | Discharge status: Home / Self Care | Payer: Medicare PPO | Attending: Emergency Medicine | Admitting: Emergency Medicine

## 2022-06-12 DIAGNOSIS — I120 Hypertensive chronic kidney disease with stage 5 chronic kidney disease or end stage renal disease: Secondary | ICD-10-CM | POA: Diagnosis not present

## 2022-06-12 DIAGNOSIS — Z8546 Personal history of malignant neoplasm of prostate: Secondary | ICD-10-CM | POA: Diagnosis not present

## 2022-06-12 DIAGNOSIS — E1122 Type 2 diabetes mellitus with diabetic chronic kidney disease: Secondary | ICD-10-CM | POA: Insufficient documentation

## 2022-06-12 DIAGNOSIS — R1013 Epigastric pain: Secondary | ICD-10-CM | POA: Diagnosis not present

## 2022-06-12 DIAGNOSIS — N186 End stage renal disease: Secondary | ICD-10-CM | POA: Insufficient documentation

## 2022-06-12 DIAGNOSIS — I10 Essential (primary) hypertension: Secondary | ICD-10-CM

## 2022-06-12 DIAGNOSIS — Z85528 Personal history of other malignant neoplasm of kidney: Secondary | ICD-10-CM | POA: Diagnosis not present

## 2022-06-12 DIAGNOSIS — R112 Nausea with vomiting, unspecified: Secondary | ICD-10-CM | POA: Diagnosis not present

## 2022-06-12 LAB — CBC
HCT: 38.2 % — ABNORMAL LOW (ref 39.0–52.0)
Hemoglobin: 12 g/dL — ABNORMAL LOW (ref 13.0–17.0)
MCH: 30.2 pg (ref 26.0–34.0)
MCHC: 31.4 g/dL (ref 30.0–36.0)
MCV: 96 fL (ref 80.0–100.0)
Platelets: 150 10*3/uL (ref 150–400)
RBC: 3.98 MIL/uL — ABNORMAL LOW (ref 4.22–5.81)
RDW: 17.2 % — ABNORMAL HIGH (ref 11.5–15.5)
WBC: 5.5 10*3/uL (ref 4.0–10.5)
nRBC: 0 % (ref 0.0–0.2)

## 2022-06-12 LAB — URINALYSIS, ROUTINE W REFLEX MICROSCOPIC
Bilirubin Urine: NEGATIVE
Glucose, UA: NEGATIVE mg/dL
Hgb urine dipstick: NEGATIVE
Ketones, ur: NEGATIVE mg/dL
Leukocytes,Ua: NEGATIVE
Nitrite: NEGATIVE
Protein, ur: 100 mg/dL — AB
Specific Gravity, Urine: 1.009 (ref 1.005–1.030)
Squamous Epithelial / HPF: NONE SEEN (ref 0–5)
pH: 6 (ref 5.0–8.0)

## 2022-06-12 LAB — COMPREHENSIVE METABOLIC PANEL
ALT: 13 U/L (ref 0–44)
AST: 18 U/L (ref 15–41)
Albumin: 3.8 g/dL (ref 3.5–5.0)
Alkaline Phosphatase: 125 U/L (ref 38–126)
Anion gap: 13 (ref 5–15)
BUN: 60 mg/dL — ABNORMAL HIGH (ref 8–23)
CO2: 24 mmol/L (ref 22–32)
Calcium: 8.2 mg/dL — ABNORMAL LOW (ref 8.9–10.3)
Chloride: 102 mmol/L (ref 98–111)
Creatinine, Ser: 6 mg/dL — ABNORMAL HIGH (ref 0.61–1.24)
GFR, Estimated: 10 mL/min — ABNORMAL LOW (ref >60)
Glucose, Bld: 110 mg/dL — ABNORMAL HIGH (ref 70–99)
Potassium: 3.1 mmol/L — ABNORMAL LOW (ref 3.5–5.1)
Sodium: 139 mmol/L (ref 135–145)
Total Bilirubin: 0.6 mg/dL (ref 0.3–1.2)
Total Protein: 7.1 g/dL (ref 6.5–8.1)

## 2022-06-12 LAB — TROPONIN I (HIGH SENSITIVITY)
Troponin I (High Sensitivity): 29 ng/L — ABNORMAL HIGH (ref <18)
Troponin I (High Sensitivity): 34 ng/L — ABNORMAL HIGH (ref <18)

## 2022-06-12 LAB — LIPASE, BLOOD: Lipase: 21 U/L (ref 11–51)

## 2022-06-12 MED ORDER — ALUM & MAG HYDROXIDE-SIMETH 200-200-20 MG/5ML PO SUSP
30.0000 mL | Freq: Once | ORAL | Status: AC
  Administered 2022-06-12: 30 mL via ORAL
  Filled 2022-06-12: qty 30

## 2022-06-12 MED ORDER — CARVEDILOL 6.25 MG PO TABS
12.5000 mg | ORAL_TABLET | Freq: Once | ORAL | Status: AC
  Administered 2022-06-12: 12.5 mg via ORAL
  Filled 2022-06-12: qty 2

## 2022-06-12 MED ORDER — CARVEDILOL 25 MG PO TABS: 25.0000 mg | ORAL_TABLET | Freq: Once | ORAL | Status: DC

## 2022-06-12 MED ORDER — IOHEXOL 350 MG/ML SOLN
100.0000 mL | Freq: Once | INTRAVENOUS | Status: AC | PRN
  Administered 2022-06-12: 100 mL via INTRAVENOUS

## 2022-06-12 MED ORDER — ONDANSETRON HCL 4 MG/2ML IJ SOLN
4.0000 mg | Freq: Once | INTRAMUSCULAR | Status: AC
  Administered 2022-06-12: 4 mg via INTRAVENOUS
  Filled 2022-06-12: qty 2

## 2022-06-12 MED ORDER — PANTOPRAZOLE SODIUM 40 MG IV SOLR
40.0000 mg | Freq: Once | INTRAVENOUS | Status: AC
  Administered 2022-06-12: 40 mg via INTRAVENOUS
  Filled 2022-06-12: qty 10

## 2022-06-12 MED ORDER — LIDOCAINE VISCOUS HCL 2 % MT SOLN
15.0000 mL | Freq: Once | OROMUCOSAL | Status: AC
  Administered 2022-06-12: 15 mL via ORAL
  Filled 2022-06-12: qty 15

## 2022-06-12 MED ORDER — HYDROMORPHONE HCL 1 MG/ML IJ SOLN
1.0000 mg | Freq: Once | INTRAMUSCULAR | Status: AC
  Administered 2022-06-12: 1 mg via INTRAVENOUS
  Filled 2022-06-12: qty 1

## 2022-06-12 MED ORDER — OXYCODONE HCL 5 MG PO TABS: 5.0000 mg | ORAL_TABLET | Freq: Four times a day (QID) | ORAL | 0 refills | Status: DC | PRN

## 2022-06-12 MED ORDER — FAMOTIDINE 10 MG PO TABS: 10.0000 mg | ORAL_TABLET | Freq: Every day | ORAL | 0 refills | Status: DC

## 2022-06-12 MED ORDER — AMLODIPINE BESYLATE 5 MG PO TABS
5.0000 mg | ORAL_TABLET | Freq: Once | ORAL | Status: AC
  Administered 2022-06-12: 5 mg via ORAL
  Filled 2022-06-12: qty 1

## 2022-06-12 NOTE — ED Notes (Signed)
After pain medication given, patients spO2 dropped to 79% on RA. Pt placed on 2lpm via [place] and spO2 is 99%

## 2022-06-12 NOTE — ED Notes (Signed)
Report and hand off of care given to [place], [place]

## 2022-06-12 NOTE — ED Triage Notes (Addendum)
Pt to ED via [facility] from [facility]. Pt reports lower abdominal pain that started this morning. Pt also reports constipation. Pt with hx hypertension and did not take medicine this morning. BP in triage 201/104.

## 2022-06-12 NOTE — ED Notes (Signed)
Pt in room on cardiac, bp and pulse ox monitoring. Pt states the pain is easing up.

## 2022-06-12 NOTE — ED Triage Notes (Signed)
Pt in via EMS from [facility] with c/o abd pain. EMS reports pain is lower abd, started this am, has not had a BM in several days and has a hx of constipation.   190/110, hx of a-fib, HR 40-90 controlled

## 2022-06-12 NOTE — ED Notes (Signed)
EKG completed and given to provider for review. Provider notified pt was placed on 2lpm of oxygen

## 2022-06-12 NOTE — ED Provider Notes (Signed)
[facility] Provider Note    Event Date/Time   First MD Initiated Contact with Patient 06/12/22 1232     (approximate)   History   Abdominal Pain   HPI  Micheal Aguirre is a 66 y.o. male   with past medical history of diabetes, end-stage renal disease, hypertension, hyperlipidemia, prostate cancer, renal cell cancer, here with severe abdominal pain.  The patient states that he ate breakfast this morning.  Shortly thereafter, he developed severe, gnawing, bandlike pain along his upper abdomen to his bilateral upper quadrants.  He had associated nausea and vomiting.  This pain is persisted since onset.  Has been unable to get comfortable.  States that he has had similar symptoms in the past.  He has a history of ulcers but denies any bleeding.  The pain does not radiate to his back.  He states that he did not take his medications as he had been vomiting.  No other complaints.     Physical Exam   Triage Vital Signs: ED Triage Vitals  Enc Vitals Group     BP 06/12/22 1040 (!) 201/104     Pulse Rate 06/12/22 1040 65     Resp 06/12/22 1040 18     Temp 06/12/22 1040 97.7 F (36.5 C)     Temp Source 06/12/22 1040 Oral     SpO2 06/12/22 1040 99 %     Weight 06/12/22 1041 180 lb (81.6 kg)     Height 06/12/22 1041 '5\' 9"'$  (1.753 m)     Head Circumference --      [facility] Flow --      Pain Score 06/12/22 1040 8     Pain Loc --      Pain Edu? --      Excl. in [place]? --     Most recent vital signs: Vitals:   06/12/22 1545 06/12/22 1600  BP:  (!) 150/109  Pulse: (!) 53 77  Resp: 18 (!) 25  Temp:    SpO2: 100%      General: Awake, no distress.  CV:  Good peripheral perfusion.  Mild tachycardia noted. Resp:  Normal effort.  Lungs clear to auscultation. Abd:  No distention.  Moderate epigastric tenderness to palpation. Other:  Pulses 2+ and symmetric bilateral upper and lower extremities.   ED Results / Procedures / Treatments   Labs (all labs ordered are  listed, but only abnormal results are displayed) Labs Reviewed  COMPREHENSIVE METABOLIC PANEL - Abnormal; Notable for the following components:      Result Value   Potassium 3.1 (*)    Glucose, Bld 110 (*)    BUN 60 (*)    Creatinine, Ser 6.00 (*)    Calcium 8.2 (*)    GFR, Estimated 10 (*)    All other components within normal limits  CBC - Abnormal; Notable for the following components:   RBC 3.98 (*)    Hemoglobin 12.0 (*)    HCT 38.2 (*)    RDW 17.2 (*)    All other components within normal limits  URINALYSIS, ROUTINE W REFLEX MICROSCOPIC - Abnormal; Notable for the following components:   Color, Urine YELLOW (*)    APPearance CLEAR (*)    Protein, ur 100 (*)    Bacteria, UA RARE (*)    All other components within normal limits  TROPONIN I (HIGH SENSITIVITY) - Abnormal; Notable for the following components:   Troponin I (High Sensitivity) 29 (*)    All  other components within normal limits  TROPONIN I (HIGH SENSITIVITY) - Abnormal; Notable for the following components:   Troponin I (High Sensitivity) 34 (*)    All other components within normal limits  LIPASE, BLOOD     EKG Atrial fibrillation, ventricular rate 92.  QRS 90, QTc 508.  No acute ST elevations or depressions.   RADIOLOGY CT abdomen/pelvis: Bladder wall circumferentially thickened, mild left-sided hydronephrosis and left renal mass   I also independently reviewed and agree with radiologist interpretations.   PROCEDURES:  Critical Care performed: No  .1-3 Lead EKG Interpretation  Performed by: Duffy Bruce, MD Authorized by: Duffy Bruce, MD     Interpretation: abnormal     ECG rate:  80-90   ECG rate assessment: normal     Rhythm: atrial fibrillation     Ectopy: none     Conduction: normal   Comments:     Indication: Chest/abdominal pain     MEDICATIONS ORDERED IN ED: Medications  HYDROmorphone (DILAUDID) injection 1 mg (1 mg Intravenous Given 06/12/22 1309)  ondansetron  (ZOFRAN) injection 4 mg (4 mg Intravenous Given 06/12/22 1314)  amLODipine (NORVASC) tablet 5 mg (5 mg Oral Given 06/12/22 1514)  pantoprazole (PROTONIX) injection 40 mg (40 mg Intravenous Given 06/12/22 1514)  carvedilol (COREG) tablet 12.5 mg (12.5 mg Oral Given 06/12/22 1512)  alum & mag hydroxide-simeth (MAALOX/MYLANTA) 200-200-20 MG/5ML suspension 30 mL (30 mLs Oral Given 06/12/22 1549)    And  lidocaine (XYLOCAINE) 2 % viscous mouth solution 15 mL (15 mLs Oral Given 06/12/22 1549)  carvedilol (COREG) tablet 12.5 mg (12.5 mg Oral Given 06/12/22 1547)     IMPRESSION / MDM / ASSESSMENT AND PLAN / ED COURSE  I reviewed the triage vital signs and the nursing notes.                               The patient is on the cardiac monitor to evaluate for evidence of arrhythmia and/or significant heart rate changes.   Ddx:  Differential includes the following, with pertinent life- or limb-threatening emergencies considered:  Gastritis, GERD, pancreatitis, obstruction, volvulus, ACS/atypical angina, nephrolithiasis, bladder spasm, unlikely dissection/AAA  Patient's presentation is most consistent with acute presentation with potential threat to life or bodily function.  MDM:  66 yo M with h/o HTN, DM, ESRD here with severe epigastric pain. Pain improved with dilaudid but pt remains severely hypertensive out of proportion to his usual range. Unclear if this is 2/2 the pain versus missing his PO meds, but must also consider dissection given acuity of onset and severity. CT without contrast unremarkable - renal findings seem chronic based on imaging and have been noted previously, and his UA shows no signs of infection.  Pt feels better after analgesia, but will send for CT Angio given persistent HTN and >20 mm Hg difference R>L arms. Pt updated and is in agreement. He is due for HD tomorrow - discussed with Dr. Joylene John who says it is safe to give Contrast. Pt in agreement. Will send for CT. Pt  given his PO dose of coreg (25 total), amlodipine, and feels better. GI cocktail given.  If CT negative, would treat for likely GI related pain with addition of Pepcid, continuation of PPI BID and GI f/u.   MEDICATIONS GIVEN IN ED: Medications  HYDROmorphone (DILAUDID) injection 1 mg (1 mg Intravenous Given 06/12/22 1309)  ondansetron (ZOFRAN) injection 4 mg (4 mg Intravenous Given 06/12/22 1314)  amLODipine (NORVASC) tablet 5 mg (5 mg Oral Given 06/12/22 1514)  pantoprazole (PROTONIX) injection 40 mg (40 mg Intravenous Given 06/12/22 1514)  carvedilol (COREG) tablet 12.5 mg (12.5 mg Oral Given 06/12/22 1512)  alum & mag hydroxide-simeth (MAALOX/MYLANTA) 200-200-20 MG/5ML suspension 30 mL (30 mLs Oral Given 06/12/22 1549)    And  lidocaine (XYLOCAINE) 2 % viscous mouth solution 15 mL (15 mLs Oral Given 06/12/22 1549)  carvedilol (COREG) tablet 12.5 mg (12.5 mg Oral Given 06/12/22 1547)     Consults:  Discussed with Nephrology Dr. Joylene John   EMR reviewed       FINAL CLINICAL IMPRESSION(S) / ED DIAGNOSES   Final diagnoses:  Epigastric pain  Primary hypertension     Rx / DC Orders   ED Discharge Orders          Ordered    famotidine (PEPCID) 10 MG tablet  Daily        06/12/22 1612    oxyCODONE (OXY IR/ROXICODONE) 5 MG immediate release tablet  Every 6 hours PRN,   Status:  Discontinued        06/12/22 1612             Note:  This document was prepared using Dragon voice recognition software and may include unintentional dictation errors.   Duffy Bruce, MD 06/12/22 [phone]

## 2022-06-12 NOTE — ED Notes (Signed)
Pt states coming in for abdominal pain and would like a laxative. Pt states last BM was yesterday. Pt reports nausea and vomiting. Pt resting in room.

## 2023-03-20 ENCOUNTER — Emergency Department: Payer: Medicare HMO

## 2023-03-20 ENCOUNTER — Emergency Department
Admission: EM | Admit: 2023-03-20 | Discharge: 2023-03-20 | Disposition: A | Discharge status: Home / Self Care | Payer: Medicare HMO | Attending: Student in an Organized Health Care Education/Training Program | Admitting: Student in an Organized Health Care Education/Training Program

## 2023-03-20 ENCOUNTER — Other Ambulatory Visit: Payer: Self-pay

## 2023-03-20 DIAGNOSIS — R0789 Other chest pain: Secondary | ICD-10-CM | POA: Insufficient documentation

## 2023-03-20 DIAGNOSIS — Z7901 Long term (current) use of anticoagulants: Secondary | ICD-10-CM | POA: Insufficient documentation

## 2023-03-20 DIAGNOSIS — N186 End stage renal disease: Secondary | ICD-10-CM | POA: Diagnosis not present

## 2023-03-20 DIAGNOSIS — Z992 Dependence on renal dialysis: Secondary | ICD-10-CM | POA: Insufficient documentation

## 2023-03-20 LAB — BASIC METABOLIC PANEL
Anion gap: 14 (ref 5–15)
BUN: 41 mg/dL — ABNORMAL HIGH (ref 8–23)
CO2: 26 mmol/L (ref 22–32)
Calcium: 8.1 mg/dL — ABNORMAL LOW (ref 8.9–10.3)
Chloride: 101 mmol/L (ref 98–111)
Creatinine, Ser: 6.51 mg/dL — ABNORMAL HIGH (ref 0.61–1.24)
GFR, Estimated: 9 mL/min — ABNORMAL LOW (ref >60)
Glucose, Bld: 119 mg/dL — ABNORMAL HIGH (ref 70–99)
Potassium: 3.4 mmol/L — ABNORMAL LOW (ref 3.5–5.1)
Sodium: 141 mmol/L (ref 135–145)

## 2023-03-20 LAB — TROPONIN I (HIGH SENSITIVITY)
Troponin I (High Sensitivity): 37 ng/L — ABNORMAL HIGH (ref <18)
Troponin I (High Sensitivity): 33 ng/L — ABNORMAL HIGH (ref <18)

## 2023-03-20 LAB — CBC
HCT: 34.8 % — ABNORMAL LOW (ref 39.0–52.0)
Hemoglobin: 10.9 g/dL — ABNORMAL LOW (ref 13.0–17.0)
MCH: 31.9 pg (ref 26.0–34.0)
MCHC: 31.3 g/dL (ref 30.0–36.0)
MCV: 101.8 fL — ABNORMAL HIGH (ref 80.0–100.0)
Platelets: 184 10*3/uL (ref 150–400)
RBC: 3.42 MIL/uL — ABNORMAL LOW (ref 4.22–5.81)
RDW: 13.5 % (ref 11.5–15.5)
WBC: 7.5 10*3/uL (ref 4.0–10.5)
nRBC: 0 % (ref 0.0–0.2)

## 2023-03-20 NOTE — ED Provider Notes (Signed)
[facility] Provider Note    Event Date/Time   First MD Initiated Contact with Patient 03/20/23 1519     (approximate)   History   Chest Pain   HPI  Micheal Aguirre is a 67 y.o. male with a ESRD on dialysis presents from dialysis for episode of 5 minutes of chest pain around initiation of dialysis.  Denies any diaphoresis happened suddenly and went away quickly as well.  Denies any fevers no cough.  Is never had pain like that that he can recall.     Physical Exam   Triage Vital Signs: ED Triage Vitals [03/20/23 1142]  Encounter Vitals Group     BP 112/79     Systolic BP Percentile      Diastolic BP Percentile      Pulse Rate 67     Resp 18     Temp 98.4 F (36.9 C)     Temp Source Oral     SpO2 97 %     Weight 179 lb 14.3 oz (81.6 kg)     Height 5\' 9"  (1.753 m)     Head Circumference      Peak Flow      Pain Score 0     Pain Loc      Pain Education      Exclude from Growth Chart     Most recent vital signs: Vitals:   03/20/23 1142  BP: 112/79  Pulse: 67  Resp: 18  Temp: 98.4 F (36.9 C)  SpO2: 97%     Constitutional: Alert  Eyes: Conjunctivae are normal.  Head: Atraumatic. Nose: No congestion/rhinnorhea. Mouth/Throat: Mucous membranes are moist.   Neck: Painless ROM.  Cardiovascular:   Good peripheral circulation. Respiratory: Normal respiratory effort.  No retractions.  Gastrointestinal: Soft and nontender.  Musculoskeletal:  no deformity Neurologic:  MAE spontaneously. No gross focal neurologic deficits are appreciated.  Skin:  Skin is warm, dry and intact. No rash noted. Psychiatric: Mood and affect are normal. Speech and behavior are normal.    ED Results / Procedures / Treatments   Labs (all labs ordered are listed, but only abnormal results are displayed) Labs Reviewed  BASIC METABOLIC PANEL - Abnormal; Notable for the following components:      Result Value   Potassium 3.4 (*)    Glucose, Bld 119 (*)    BUN 41  (*)    Creatinine, Ser 6.51 (*)    Calcium 8.1 (*)    GFR, Estimated 9 (*)    All other components within normal limits  CBC - Abnormal; Notable for the following components:   RBC 3.42 (*)    Hemoglobin 10.9 (*)    HCT 34.8 (*)    MCV 101.8 (*)    All other components within normal limits  TROPONIN I (HIGH SENSITIVITY) - Abnormal; Notable for the following components:   Troponin I (High Sensitivity) 37 (*)    All other components within normal limits  TROPONIN I (HIGH SENSITIVITY) - Abnormal; Notable for the following components:   Troponin I (High Sensitivity) 33 (*)    All other components within normal limits     EKG  ED ECG REPORT I, Willy Eddy, the attending physician, personally viewed and interpreted this ECG.   Date: 03/20/2023  EKG Time: 11:39  Rate: 60  Rhythm: a fib  Axis: normal  Intervals: normal  ST&T Change: nonspecific  st abn, no stemi, no significant changes from prior  RADIOLOGY Please see ED Course for my review and interpretation.  I personally reviewed all radiographic images ordered to evaluate for the above acute complaints and reviewed radiology reports and findings.  These findings were personally discussed with the patient.  Please see medical record for radiology report.    PROCEDURES:  Critical Care performed: No  Procedures   MEDICATIONS ORDERED IN ED: Medications - No data to display   IMPRESSION / MDM / ASSESSMENT AND PLAN / ED COURSE  I reviewed the triage vital signs and the nursing notes.                              Differential diagnosis includes, but is not limited to, ACS, pericarditis, bronchitis, costochondritis, pna, Pe, dissection   Patient presenting to the ER for evaluation of symptoms as described above.  Based on symptoms, risk factors and considered above differential, this presenting complaint could reflect a potentially life-threatening illness therefore the patient will be placed on continuous  pulse oximetry and telemetry for monitoring.  Laboratory evaluation will be sent to evaluate for the above complaints.  Patient nontoxic well-appearing in no acute distress at this time.  Low suspicion for PE he is on Eliquis.  His abdominal exam is soft and benign.  Initial troponin is equivocal on likely chronic secondary to his ESRD.  Is not hypoxic doubt infectious process.  Will observe for serial enzyme.   Clinical Course as of 03/20/23 1704  Mon Mar 20, 2023  1701 Reassessed.  Troponins are flat actually downtrending.  He remains pain-free no discomfort.  Further reflection he states that he did feel significant improvement in his discomfort after he belched.  States he was eating food at the time.  Suspect this more likely indigestion.  Does appear stable and appropriate for outpatient follow-up. [PR]    Clinical Course User Index [PR] Willy Eddy, MD     FINAL CLINICAL IMPRESSION(S) / ED DIAGNOSES   Final diagnoses:  Atypical chest pain     Rx / DC Orders   ED Discharge Orders     None        Note:  This document was prepared using Dragon voice recognition software and may include unintentional dictation errors.    Willy Eddy, MD 03/20/23 [phone]

## 2023-03-20 NOTE — ED Notes (Signed)
[facility] called and notified of pts dc. A ride is being set up for pt via [facility].

## 2023-03-20 NOTE — ED Triage Notes (Signed)
Pt here via [facility] with cp while at dialysis. Pt did not get a chance to finish his dialysis. Pt denies pain at the moment, states he just had the one episode of cp earlier. Pt denies NVD.

## 2023-03-20 NOTE — ED Notes (Signed)
First Nurse Note: Patient to ED via [facility] from dialysis for CP and 1 episode of vomiting. Did not received any dialysis but was given 1 nitroglycerin from dialysis. No current chest pain.

## 2023-04-11 ENCOUNTER — Emergency Department
Admission: EM | Admit: 2023-04-11 | Discharge: 2023-04-11 | Disposition: A | Discharge status: Home / Self Care | Payer: Medicare HMO | Attending: Emergency Medicine | Admitting: Emergency Medicine

## 2023-04-11 ENCOUNTER — Other Ambulatory Visit: Payer: Self-pay

## 2023-04-11 ENCOUNTER — Emergency Department: Payer: Medicare HMO

## 2023-04-11 DIAGNOSIS — R1084 Generalized abdominal pain: Secondary | ICD-10-CM

## 2023-04-11 DIAGNOSIS — E1122 Type 2 diabetes mellitus with diabetic chronic kidney disease: Secondary | ICD-10-CM | POA: Insufficient documentation

## 2023-04-11 DIAGNOSIS — R970 Elevated carcinoembryonic antigen [CEA]: Secondary | ICD-10-CM | POA: Insufficient documentation

## 2023-04-11 DIAGNOSIS — I120 Hypertensive chronic kidney disease with stage 5 chronic kidney disease or end stage renal disease: Secondary | ICD-10-CM | POA: Diagnosis not present

## 2023-04-11 DIAGNOSIS — R978 Other abnormal tumor markers: Secondary | ICD-10-CM | POA: Insufficient documentation

## 2023-04-11 DIAGNOSIS — Z992 Dependence on renal dialysis: Secondary | ICD-10-CM | POA: Insufficient documentation

## 2023-04-11 DIAGNOSIS — N186 End stage renal disease: Secondary | ICD-10-CM | POA: Insufficient documentation

## 2023-04-11 DIAGNOSIS — R972 Elevated prostate specific antigen [PSA]: Secondary | ICD-10-CM | POA: Insufficient documentation

## 2023-04-11 DIAGNOSIS — R160 Hepatomegaly, not elsewhere classified: Secondary | ICD-10-CM | POA: Diagnosis not present

## 2023-04-11 DIAGNOSIS — R1010 Upper abdominal pain, unspecified: Secondary | ICD-10-CM | POA: Diagnosis present

## 2023-04-11 LAB — COMPREHENSIVE METABOLIC PANEL
ALT: 64 U/L — ABNORMAL HIGH (ref 0–44)
AST: 91 U/L — ABNORMAL HIGH (ref 15–41)
Albumin: 3 g/dL — ABNORMAL LOW (ref 3.5–5.0)
Alkaline Phosphatase: 156 U/L — ABNORMAL HIGH (ref 38–126)
Anion gap: 11 (ref 5–15)
BUN: 28 mg/dL — ABNORMAL HIGH (ref 8–23)
CO2: 22 mmol/L (ref 22–32)
Calcium: 8 mg/dL — ABNORMAL LOW (ref 8.9–10.3)
Chloride: 102 mmol/L (ref 98–111)
Creatinine, Ser: 6.22 mg/dL — ABNORMAL HIGH (ref 0.61–1.24)
GFR, Estimated: 9 mL/min — ABNORMAL LOW (ref >60)
Glucose, Bld: 91 mg/dL (ref 70–99)
Potassium: 3.5 mmol/L (ref 3.5–5.1)
Sodium: 135 mmol/L (ref 135–145)
Total Bilirubin: 1.1 mg/dL (ref 0.3–1.2)
Total Protein: 6.2 g/dL — ABNORMAL LOW (ref 6.5–8.1)

## 2023-04-11 LAB — CBC
HCT: 34.6 % — ABNORMAL LOW (ref 39.0–52.0)
Hemoglobin: 11.3 g/dL — ABNORMAL LOW (ref 13.0–17.0)
MCH: 33.2 pg (ref 26.0–34.0)
MCHC: 32.7 g/dL (ref 30.0–36.0)
MCV: 101.8 fL — ABNORMAL HIGH (ref 80.0–100.0)
Platelets: 143 10*3/uL — ABNORMAL LOW (ref 150–400)
RBC: 3.4 MIL/uL — ABNORMAL LOW (ref 4.22–5.81)
RDW: 14.9 % (ref 11.5–15.5)
WBC: 7 10*3/uL (ref 4.0–10.5)
nRBC: 0 % (ref 0.0–0.2)

## 2023-04-11 LAB — PSA: Prostatic Specific Antigen: 4.35 ng/mL — ABNORMAL HIGH (ref 0.00–4.00)

## 2023-04-11 LAB — TROPONIN I (HIGH SENSITIVITY)
Troponin I (High Sensitivity): 49 ng/L — ABNORMAL HIGH (ref <18)
Troponin I (High Sensitivity): 50 ng/L — ABNORMAL HIGH (ref <18)

## 2023-04-11 LAB — LIPASE, BLOOD: Lipase: 24 U/L (ref 11–51)

## 2023-04-11 MED ORDER — OXYCODONE HCL 5 MG PO TABS: 5.0000 mg | ORAL_TABLET | Freq: Three times a day (TID) | ORAL | 0 refills | Status: DC | PRN

## 2023-04-11 MED ORDER — HYDROMORPHONE HCL 1 MG/ML IJ SOLN
1.0000 mg | Freq: Once | INTRAMUSCULAR | Status: AC
  Administered 2023-04-11: 1 mg via INTRAVENOUS
  Filled 2023-04-11: qty 1

## 2023-04-11 MED ORDER — ONDANSETRON 4 MG PO TBDP
4.0000 mg | ORAL_TABLET | Freq: Once | ORAL | Status: AC
  Administered 2023-04-11: 4 mg via ORAL
  Filled 2023-04-11: qty 1

## 2023-04-11 MED ORDER — MORPHINE SULFATE (PF) 4 MG/ML IV SOLN
4.0000 mg | Freq: Once | INTRAVENOUS | Status: AC
  Administered 2023-04-11: 4 mg via INTRAMUSCULAR
  Filled 2023-04-11: qty 1

## 2023-04-11 NOTE — ED Provider Notes (Signed)
[facility] Provider Note    Event Date/Time   First MD Initiated Contact with Patient 04/11/23 0915     (approximate)  History   Chief Complaint: Abdominal Pain  HPI  Wrigley Lema is a 67 y.o. male with a past medical history of diabetes, hypertension, hyperlipidemia, end-stage renal disease on hemodialysis who presents to the emergency department for upper abdominal pain.  According to the patient since this morning he has been experiencing upper abdominal pain (contrary to triage note) along with nausea.  Denies any vomiting.  Patient is on dialysis (right subclavian line) Monday, Tuesday, Wednesday, Friday.  Patient had a full session of dialysis yesterday.  Physical Exam   Triage Vital Signs: ED Triage Vitals  Encounter Vitals Group     BP 04/11/23 0913 (!) 147/100     Systolic BP Percentile --      Diastolic BP Percentile --      Pulse Rate 04/11/23 0913 (!) 51     Resp 04/11/23 0913 18     Temp 04/11/23 0913 98.1 F (36.7 C)     Temp Source 04/11/23 0913 Oral     SpO2 04/11/23 0913 96 %     Weight 04/11/23 0915 220 lb (99.8 kg)     Height 04/11/23 0915 5\' 9"  (1.753 m)     Head Circumference --      Peak Flow --      Pain Score 04/11/23 0914 6     Pain Loc --      Pain Education --      Exclude from Growth Chart --     Most recent vital signs: Vitals:   04/11/23 0913  BP: (!) 147/100  Pulse: (!) 51  Resp: 18  Temp: 98.1 F (36.7 C)  SpO2: 96%    General: Awake, no distress.  CV:  Good peripheral perfusion.  Regular rate and rhythm  Resp:  Normal effort.  Equal breath sounds bilaterally.  Right subclavian line present. Abd:  No distention.  Soft, nontender.  No rebound or guarding.  Benign abdomen.  ED Results / Procedures / Treatments   EKG  EKG viewed and interpreted by myself shows atrial fibrillation at 64 bpm with a narrow QRS, normal axis, normal intervals, nonspecific ST changes.  RADIOLOGY  I have reviewed and  interpreted CT chest.  Patient does appear to have hypodensities within the liver.  No obstruction or significant abnormality seen on my evaluation. Radiology has read the CT scans multiple hypodense lesions scattered throughout the liver concerning for a neoplastic process.   MEDICATIONS ORDERED IN ED: Medications  morphine (PF) 4 MG/ML injection 4 mg (has no administration in time range)  ondansetron (ZOFRAN-ODT) disintegrating tablet 4 mg (has no administration in time range)     IMPRESSION / MDM / ASSESSMENT AND PLAN / ED COURSE  I reviewed the triage vital signs and the nursing notes.  Patient's presentation is most consistent with acute presentation with potential threat to life or bodily function.  Patient presents to the emergency department for upper abdominal pain since this morning along with nausea.  Overall the patient appears well, no distress.  Denies any chest pain or shortness of breath.  States nausea but denies any vomiting or diarrhea.  Patient is on dialysis and did receive his full session yesterday.  We will check labs including CBC chemistry lipase and cardiac enzymes.  Will obtain a CT scan without contrast to further evaluate.  We will treat pain  and nausea while awaiting results.  Patient agreeable to plan of care.  Patient's lab work is overall reassuring, chemistry shows a potassium of 3.5.  Patient does have mild LFT elevation that appears to be new compared to historical values.  Will obtain a CT scan as well as a right upper quadrant ultrasound to further evaluate.  Patient CBC shows no concerning findings troponin slightly elevated although largely unchanged on recheck and lipase is normal.  Patient CT scan and ultrasound have resulted showing hepatic lesions concerning for neoplastic process.  I spoke to Dr. Orlie Dakin who has ordered additional lab work to be performed in the emergency department he will then call the patient to arrange a follow-up appointment  this week in the office.  I will discharge the patient with a course of pain medication to be used if needed although the patient denies any pain currently.  I have updated the patient on the findings and the plan of care.  Patient agreeable to plan.  FINAL CLINICAL IMPRESSION(S) / ED DIAGNOSES   Upper abdominal pain Liver mass  Note:  This document was prepared using Dragon voice recognition software and may include unintentional dictation errors.   Minna Antis, MD 04/11/23 1441

## 2023-04-11 NOTE — Discharge Instructions (Signed)
As we discussed your workup in the emergency department has shown several masses within the liver that are concerning for a cancerous process.  Oncology should be calling you at home to arrange an appointment.  If you do not hear from them for any reason please call them at the number provided to arrange a follow-up appointment this week.  Please take your pain medication if needed, as written.  Return to the emergency department for any symptom personally concerning to yourself.

## 2023-04-11 NOTE — ED Notes (Signed)
[facility]  CALLED   FOR  TRANSPORT  TO [facility] 

## 2023-04-11 NOTE — ED Notes (Signed)
Report given to transport, pt able to stand with assistance and transfer onto stretcher. Pt taken to exit via [facility] at this time.

## 2023-04-11 NOTE — ED Notes (Signed)
Patient transported to CT 

## 2023-04-11 NOTE — ED Triage Notes (Addendum)
Pt arrives via [facility] w/ c/o lower quadrant abd pain that radiates into back and nausea since this morning around 0300. EMS also reporting RLE swelling that pt reports is chronic. Pt does dialysis MT[facility], has not gone to dialysis today but did go yesterday.   VSS

## 2023-04-11 NOTE — ED Notes (Signed)
Pt informed of need for urine sample, urinal provided.

## 2023-04-11 NOTE — ED Notes (Signed)
Report given to [facility], they are unable to transport pt. Will arrange EMS transport.

## 2023-04-12 ENCOUNTER — Telehealth: Payer: Self-pay

## 2023-04-12 ENCOUNTER — Other Ambulatory Visit: Payer: Self-pay

## 2023-04-12 DIAGNOSIS — R160 Hepatomegaly, not elsewhere classified: Secondary | ICD-10-CM

## 2023-04-12 LAB — CANCER ANTIGEN 19-9: CA 19-9: 14 U/mL (ref 0–35)

## 2023-04-12 LAB — AFP TUMOR MARKER: AFP, Serum, Tumor Marker: 2 ng/mL (ref 0.0–8.4)

## 2023-04-12 LAB — CEA: CEA: 1197 ng/mL — ABNORMAL HIGH (ref 0.0–4.7)

## 2023-04-12 NOTE — Telephone Encounter (Signed)
Dr. Orlie Dakin received referral from the ED for new liver masses on imaging. Called and left voicemail with Micheal Aguirre to return call for appointment this week with Dr. Orlie Dakin.

## 2023-04-13 ENCOUNTER — Telehealth: Payer: Self-pay

## 2023-04-13 NOTE — Telephone Encounter (Signed)
Navigator has reached out to GU team at [facility] (Dr. Greggory Stallion, Eunice Blase PA) to arrange oncology follow up from ED visit. Per 03/16/23 visit PSA had risen today from 1.5 to 4. Discussed proceeding with restaging scans for further evaluation. CT was scheduled for early Sept with follow up visit arranged. 04/11/23 CT and [place] pushed to [facility].

## 2023-04-17 ENCOUNTER — Telehealth: Payer: Self-pay

## 2023-04-17 ENCOUNTER — Emergency Department: Payer: Medicare HMO

## 2023-04-17 ENCOUNTER — Other Ambulatory Visit: Payer: Self-pay

## 2023-04-17 ENCOUNTER — Inpatient Hospital Stay
Admission: EM | Admit: 2023-04-17 | Discharge: 2023-04-25 | DRG: 602 | Disposition: A | Discharge status: Skilled Nursing Facility | Payer: Medicare HMO | Source: Skilled Nursing Facility | Attending: Internal Medicine | Admitting: Internal Medicine

## 2023-04-17 DIAGNOSIS — N186 End stage renal disease: Secondary | ICD-10-CM | POA: Diagnosis not present

## 2023-04-17 DIAGNOSIS — Z87891 Personal history of nicotine dependence: Secondary | ICD-10-CM

## 2023-04-17 DIAGNOSIS — Z1152 Encounter for screening for COVID-19: Secondary | ICD-10-CM

## 2023-04-17 DIAGNOSIS — Z96651 Presence of right artificial knee joint: Secondary | ICD-10-CM | POA: Diagnosis present

## 2023-04-17 DIAGNOSIS — I5032 Chronic diastolic (congestive) heart failure: Secondary | ICD-10-CM | POA: Diagnosis present

## 2023-04-17 DIAGNOSIS — Z951 Presence of aortocoronary bypass graft: Secondary | ICD-10-CM

## 2023-04-17 DIAGNOSIS — I2510 Atherosclerotic heart disease of native coronary artery without angina pectoris: Secondary | ICD-10-CM | POA: Diagnosis present

## 2023-04-17 DIAGNOSIS — C7951 Secondary malignant neoplasm of bone: Secondary | ICD-10-CM | POA: Diagnosis present

## 2023-04-17 DIAGNOSIS — E7800 Pure hypercholesterolemia, unspecified: Secondary | ICD-10-CM | POA: Diagnosis present

## 2023-04-17 DIAGNOSIS — C61 Malignant neoplasm of prostate: Secondary | ICD-10-CM | POA: Diagnosis not present

## 2023-04-17 DIAGNOSIS — I959 Hypotension, unspecified: Secondary | ICD-10-CM | POA: Diagnosis present

## 2023-04-17 DIAGNOSIS — Z79899 Other long term (current) drug therapy: Secondary | ICD-10-CM

## 2023-04-17 DIAGNOSIS — Z8249 Family history of ischemic heart disease and other diseases of the circulatory system: Secondary | ICD-10-CM

## 2023-04-17 DIAGNOSIS — I255 Ischemic cardiomyopathy: Secondary | ICD-10-CM | POA: Diagnosis present

## 2023-04-17 DIAGNOSIS — E669 Obesity, unspecified: Secondary | ICD-10-CM | POA: Diagnosis present

## 2023-04-17 DIAGNOSIS — M79604 Pain in right leg: Secondary | ICD-10-CM | POA: Diagnosis not present

## 2023-04-17 DIAGNOSIS — I482 Chronic atrial fibrillation, unspecified: Secondary | ICD-10-CM | POA: Diagnosis present

## 2023-04-17 DIAGNOSIS — I132 Hypertensive heart and chronic kidney disease with heart failure and with stage 5 chronic kidney disease, or end stage renal disease: Secondary | ICD-10-CM | POA: Diagnosis present

## 2023-04-17 DIAGNOSIS — C779 Secondary and unspecified malignant neoplasm of lymph node, unspecified: Secondary | ICD-10-CM | POA: Diagnosis present

## 2023-04-17 DIAGNOSIS — R5381 Other malaise: Secondary | ICD-10-CM | POA: Diagnosis present

## 2023-04-17 DIAGNOSIS — Z7952 Long term (current) use of systemic steroids: Secondary | ICD-10-CM

## 2023-04-17 DIAGNOSIS — L03115 Cellulitis of right lower limb: Secondary | ICD-10-CM | POA: Diagnosis not present

## 2023-04-17 DIAGNOSIS — Z794 Long term (current) use of insulin: Secondary | ICD-10-CM

## 2023-04-17 DIAGNOSIS — N2581 Secondary hyperparathyroidism of renal origin: Secondary | ICD-10-CM | POA: Diagnosis present

## 2023-04-17 DIAGNOSIS — Z7901 Long term (current) use of anticoagulants: Secondary | ICD-10-CM

## 2023-04-17 DIAGNOSIS — R7989 Other specified abnormal findings of blood chemistry: Secondary | ICD-10-CM

## 2023-04-17 DIAGNOSIS — D631 Anemia in chronic kidney disease: Secondary | ICD-10-CM | POA: Diagnosis present

## 2023-04-17 DIAGNOSIS — N5089 Other specified disorders of the male genital organs: Secondary | ICD-10-CM | POA: Diagnosis present

## 2023-04-17 DIAGNOSIS — Z992 Dependence on renal dialysis: Secondary | ICD-10-CM

## 2023-04-17 DIAGNOSIS — E1129 Type 2 diabetes mellitus with other diabetic kidney complication: Secondary | ICD-10-CM | POA: Diagnosis present

## 2023-04-17 DIAGNOSIS — I10 Essential (primary) hypertension: Secondary | ICD-10-CM | POA: Diagnosis present

## 2023-04-17 DIAGNOSIS — C787 Secondary malignant neoplasm of liver and intrahepatic bile duct: Secondary | ICD-10-CM | POA: Diagnosis present

## 2023-04-17 DIAGNOSIS — E1122 Type 2 diabetes mellitus with diabetic chronic kidney disease: Secondary | ICD-10-CM

## 2023-04-17 LAB — COMPREHENSIVE METABOLIC PANEL
ALT: 69 U/L — ABNORMAL HIGH (ref 0–44)
AST: 117 U/L — ABNORMAL HIGH (ref 15–41)
Albumin: 3.3 g/dL — ABNORMAL LOW (ref 3.5–5.0)
Alkaline Phosphatase: 146 U/L — ABNORMAL HIGH (ref 38–126)
Anion gap: 17 — ABNORMAL HIGH (ref 5–15)
BUN: 44 mg/dL — ABNORMAL HIGH (ref 8–23)
CO2: 21 mmol/L — ABNORMAL LOW (ref 22–32)
Calcium: 8.7 mg/dL — ABNORMAL LOW (ref 8.9–10.3)
Chloride: 100 mmol/L (ref 98–111)
Creatinine, Ser: 7.42 mg/dL — ABNORMAL HIGH (ref 0.61–1.24)
GFR, Estimated: 7 mL/min — ABNORMAL LOW (ref >60)
Glucose, Bld: 70 mg/dL (ref 70–99)
Potassium: 3.8 mmol/L (ref 3.5–5.1)
Sodium: 138 mmol/L (ref 135–145)
Total Bilirubin: 0.9 mg/dL (ref 0.3–1.2)
Total Protein: 7 g/dL (ref 6.5–8.1)

## 2023-04-17 LAB — CBC WITH DIFFERENTIAL/PLATELET
Abs Immature Granulocytes: 0.03 10*3/uL (ref 0.00–0.07)
Basophils Absolute: 0 10*3/uL (ref 0.0–0.1)
Basophils Relative: 0 %
Eosinophils Absolute: 0 10*3/uL (ref 0.0–0.5)
Eosinophils Relative: 0 %
HCT: 40.9 % (ref 39.0–52.0)
Hemoglobin: 13.2 g/dL (ref 13.0–17.0)
Immature Granulocytes: 1 %
Lymphocytes Relative: 7 %
Lymphs Abs: 0.3 10*3/uL — ABNORMAL LOW (ref 0.7–4.0)
MCH: 32.8 pg (ref 26.0–34.0)
MCHC: 32.3 g/dL (ref 30.0–36.0)
MCV: 101.5 fL — ABNORMAL HIGH (ref 80.0–100.0)
Monocytes Absolute: 0.2 10*3/uL (ref 0.1–1.0)
Monocytes Relative: 5 %
Neutro Abs: 3.9 10*3/uL (ref 1.7–7.7)
Neutrophils Relative %: 87 %
Platelets: 153 10*3/uL (ref 150–400)
RBC: 4.03 MIL/uL — ABNORMAL LOW (ref 4.22–5.81)
RDW: 15.4 % (ref 11.5–15.5)
WBC: 4.5 10*3/uL (ref 4.0–10.5)
nRBC: 1.1 % — ABNORMAL HIGH (ref 0.0–0.2)

## 2023-04-17 LAB — SARS CORONAVIRUS 2 BY RT PCR: SARS Coronavirus 2 by RT PCR: NEGATIVE

## 2023-04-17 LAB — HEPATITIS B SURFACE ANTIGEN: Hepatitis B Surface Ag: NONREACTIVE

## 2023-04-17 LAB — LACTIC ACID, PLASMA
Lactic Acid, Venous: 3.2 mmol/L (ref 0.5–1.9)
Lactic Acid, Venous: 2.9 mmol/L (ref 0.5–1.9)

## 2023-04-17 LAB — GLUCOSE, CAPILLARY: Glucose-Capillary: 108 mg/dL — ABNORMAL HIGH (ref 70–99)

## 2023-04-17 MED ORDER — MELATONIN 5 MG PO TABS
5.0000 mg | ORAL_TABLET | Freq: Every day | ORAL | Status: DC
  Administered 2023-04-17 – 2023-04-24 (×8): 5 mg via ORAL
  Filled 2023-04-17 (×8): qty 1

## 2023-04-17 MED ORDER — OXYCODONE HCL 5 MG PO TABS
ORAL_TABLET | ORAL | Status: AC
  Filled 2023-04-17: qty 1

## 2023-04-17 MED ORDER — TAMSULOSIN HCL 0.4 MG PO CAPS
0.4000 mg | ORAL_CAPSULE | Freq: Every day | ORAL | Status: DC
  Administered 2023-04-17 – 2023-04-24 (×8): 0.4 mg via ORAL
  Filled 2023-04-17 (×8): qty 1

## 2023-04-17 MED ORDER — TORSEMIDE 20 MG PO TABS
60.0000 mg | ORAL_TABLET | Freq: Every day | ORAL | Status: DC
  Administered 2023-04-18 – 2023-04-25 (×7): 60 mg via ORAL
  Filled 2023-04-17 (×7): qty 3

## 2023-04-17 MED ORDER — INSULIN ASPART 100 UNIT/ML IJ SOLN
0.0000 [IU] | Freq: Three times a day (TID) | INTRAMUSCULAR | Status: DC
  Administered 2023-04-18: 1 [IU] via SUBCUTANEOUS
  Administered 2023-04-18 (×2): 2 [IU] via SUBCUTANEOUS
  Administered 2023-04-22 (×2): 1 [IU] via SUBCUTANEOUS
  Filled 2023-04-17 (×5): qty 1

## 2023-04-17 MED ORDER — FOLIC ACID 1 MG PO TABS
1.0000 mg | ORAL_TABLET | Freq: Every day | ORAL | Status: DC
  Administered 2023-04-18 – 2023-04-25 (×6): 1 mg via ORAL
  Filled 2023-04-17 (×7): qty 1

## 2023-04-17 MED ORDER — VANCOMYCIN HCL IN DEXTROSE 1-5 GM/200ML-% IV SOLN
1000.0000 mg | Freq: Once | INTRAVENOUS | Status: DC
  Filled 2023-04-17: qty 200

## 2023-04-17 MED ORDER — ACETAMINOPHEN 325 MG PO TABS: 650.0000 mg | ORAL_TABLET | Freq: Four times a day (QID) | ORAL | Status: DC | PRN

## 2023-04-17 MED ORDER — ANTICOAGULANT SODIUM CITRATE 4% (200MG/5ML) IV SOLN: 5.0000 mL | Status: DC | PRN

## 2023-04-17 MED ORDER — VANCOMYCIN HCL 2000 MG/400ML IV SOLN
2000.0000 mg | Freq: Once | INTRAVENOUS | Status: AC
  Administered 2023-04-17: 2000 mg via INTRAVENOUS
  Filled 2023-04-17: qty 400

## 2023-04-17 MED ORDER — METRONIDAZOLE 500 MG/100ML IV SOLN: 500.0000 mg | Freq: Three times a day (TID) | INTRAVENOUS | Status: DC

## 2023-04-17 MED ORDER — LIDOCAINE-PRILOCAINE 2.5-2.5 % EX CREA: 1.0000 | TOPICAL_CREAM | CUTANEOUS | Status: DC | PRN

## 2023-04-17 MED ORDER — SODIUM CHLORIDE 0.9 % IV SOLN
2.0000 g | Freq: Once | INTRAVENOUS | Status: AC
  Administered 2023-04-17: 2 g via INTRAVENOUS
  Filled 2023-04-17: qty 12.5

## 2023-04-17 MED ORDER — ALTEPLASE 2 MG IJ SOLR: 2.0000 mg | Freq: Once | INTRAMUSCULAR | Status: DC | PRN

## 2023-04-17 MED ORDER — SENNOSIDES-DOCUSATE SODIUM 8.6-50 MG PO TABS
2.0000 | ORAL_TABLET | Freq: Two times a day (BID) | ORAL | Status: DC
  Administered 2023-04-17 – 2023-04-25 (×12): 2 via ORAL
  Filled 2023-04-17 (×14): qty 2

## 2023-04-17 MED ORDER — HEPARIN SODIUM (PORCINE) 1000 UNIT/ML IJ SOLN
INTRAMUSCULAR | Status: AC
  Filled 2023-04-17: qty 10

## 2023-04-17 MED ORDER — SODIUM CHLORIDE 0.9% FLUSH
3.0000 mL | Freq: Two times a day (BID) | INTRAVENOUS | Status: DC
  Administered 2023-04-17 – 2023-04-25 (×16): 3 mL via INTRAVENOUS

## 2023-04-17 MED ORDER — ACETAMINOPHEN 650 MG RE SUPP: 650.0000 mg | Freq: Four times a day (QID) | RECTAL | Status: DC | PRN

## 2023-04-17 MED ORDER — CALCIUM CARBONATE ANTACID 500 MG PO CHEW
500.0000 mg | CHEWABLE_TABLET | Freq: Two times a day (BID) | ORAL | Status: DC
  Administered 2023-04-17 – 2023-04-20 (×7): 500 mg via ORAL
  Filled 2023-04-17 (×7): qty 3

## 2023-04-17 MED ORDER — HEPARIN SODIUM (PORCINE) 1000 UNIT/ML DIALYSIS: 1000.0000 [IU] | INTRAMUSCULAR | Status: DC | PRN

## 2023-04-17 MED ORDER — GABAPENTIN 100 MG PO CAPS
100.0000 mg | ORAL_CAPSULE | Freq: Every day | ORAL | Status: DC
  Administered 2023-04-17 – 2023-04-24 (×8): 100 mg via ORAL
  Filled 2023-04-17 (×8): qty 1

## 2023-04-17 MED ORDER — OXYCODONE HCL 5 MG PO TABS
5.0000 mg | ORAL_TABLET | Freq: Three times a day (TID) | ORAL | Status: DC | PRN
  Administered 2023-04-17 – 2023-04-22 (×7): 5 mg via ORAL
  Filled 2023-04-17 (×7): qty 1

## 2023-04-17 MED ORDER — PREDNISONE 10 MG PO TABS
10.0000 mg | ORAL_TABLET | Freq: Every day | ORAL | Status: DC
  Administered 2023-04-18 – 2023-04-25 (×6): 10 mg via ORAL
  Filled 2023-04-17 (×6): qty 1

## 2023-04-17 MED ORDER — APIXABAN 2.5 MG PO TABS
2.5000 mg | ORAL_TABLET | Freq: Two times a day (BID) | ORAL | Status: DC
  Administered 2023-04-17 – 2023-04-25 (×15): 2.5 mg via ORAL
  Filled 2023-04-17 (×16): qty 1

## 2023-04-17 MED ORDER — HYDROMORPHONE HCL 1 MG/ML IJ SOLN
0.5000 mg | INTRAMUSCULAR | Status: DC | PRN
  Administered 2023-04-17: 1 mg via INTRAVENOUS
  Administered 2023-04-17: 0.5 mg via INTRAVENOUS
  Administered 2023-04-19: 1 mg via INTRAVENOUS
  Filled 2023-04-17 (×3): qty 1

## 2023-04-17 MED ORDER — VANCOMYCIN HCL IN DEXTROSE 1-5 GM/200ML-% IV SOLN
1000.0000 mg | Freq: Once | INTRAVENOUS | Status: AC
  Administered 2023-04-17: 1000 mg via INTRAVENOUS
  Filled 2023-04-17: qty 200

## 2023-04-17 MED ORDER — ONDANSETRON HCL 4 MG PO TABS
4.0000 mg | ORAL_TABLET | Freq: Four times a day (QID) | ORAL | Status: DC | PRN
  Administered 2023-04-23: 4 mg via ORAL
  Filled 2023-04-17: qty 1

## 2023-04-17 MED ORDER — LIDOCAINE HCL (PF) 1 % IJ SOLN: 5.0000 mL | INTRAMUSCULAR | Status: DC | PRN

## 2023-04-17 MED ORDER — SIMETHICONE 80 MG PO CHEW
80.0000 mg | CHEWABLE_TABLET | Freq: Two times a day (BID) | ORAL | Status: DC
  Administered 2023-04-17 – 2023-04-20 (×7): 80 mg via ORAL
  Filled 2023-04-17 (×8): qty 1

## 2023-04-17 MED ORDER — SODIUM CHLORIDE 0.9 % IV BOLUS
250.0000 mL | Freq: Once | INTRAVENOUS | Status: AC
  Administered 2023-04-17: 250 mL via INTRAVENOUS

## 2023-04-17 MED ORDER — METRONIDAZOLE 500 MG/100ML IV SOLN
500.0000 mg | Freq: Once | INTRAVENOUS | Status: AC
  Administered 2023-04-17: 500 mg via INTRAVENOUS
  Filled 2023-04-17: qty 100

## 2023-04-17 MED ORDER — POLYETHYLENE GLYCOL 3350 17 G PO PACK: 17.0000 g | PACK | Freq: Every day | ORAL | Status: DC | PRN

## 2023-04-17 MED ORDER — PANTOPRAZOLE SODIUM 40 MG PO TBEC
40.0000 mg | DELAYED_RELEASE_TABLET | Freq: Two times a day (BID) | ORAL | Status: DC
  Administered 2023-04-17 – 2023-04-25 (×15): 40 mg via ORAL
  Filled 2023-04-17 (×15): qty 1

## 2023-04-17 MED ORDER — POLYETHYLENE GLYCOL 3350 17 G PO PACK
17.0000 g | PACK | Freq: Every day | ORAL | Status: DC
  Administered 2023-04-18 – 2023-04-24 (×5): 17 g via ORAL
  Filled 2023-04-17 (×8): qty 1

## 2023-04-17 MED ORDER — PENTAFLUOROPROP-TETRAFLUOROETH EX AERO: 1.0000 | INHALATION_SPRAY | CUTANEOUS | Status: DC | PRN

## 2023-04-17 MED ORDER — BISACODYL 5 MG PO TBEC
10.0000 mg | DELAYED_RELEASE_TABLET | Freq: Every day | ORAL | Status: DC
  Administered 2023-04-18 – 2023-04-25 (×5): 10 mg via ORAL
  Filled 2023-04-17 (×7): qty 2

## 2023-04-17 MED ORDER — ONDANSETRON HCL 4 MG/2ML IJ SOLN
4.0000 mg | Freq: Four times a day (QID) | INTRAMUSCULAR | Status: DC | PRN
  Administered 2023-04-21 – 2023-04-24 (×2): 4 mg via INTRAVENOUS
  Filled 2023-04-17: qty 2

## 2023-04-17 MED ORDER — ATORVASTATIN CALCIUM 20 MG PO TABS
80.0000 mg | ORAL_TABLET | Freq: Every day | ORAL | Status: DC
  Administered 2023-04-17 – 2023-04-24 (×8): 80 mg via ORAL
  Filled 2023-04-17 (×8): qty 4

## 2023-04-17 MED ORDER — HEPARIN SODIUM (PORCINE) 1000 UNIT/ML IJ SOLN
3800.0000 [IU] | Freq: Once | INTRAMUSCULAR | Status: AC
  Administered 2023-04-17: 3800 [IU] via INTRAVENOUS

## 2023-04-17 MED ORDER — SODIUM CHLORIDE 0.9 % IV SOLN
2.0000 g | INTRAVENOUS | Status: DC
  Administered 2023-04-18 – 2023-04-19 (×2): 2 g via INTRAVENOUS
  Filled 2023-04-17 (×3): qty 20

## 2023-04-17 MED ORDER — CHLORHEXIDINE GLUCONATE CLOTH 2 % EX PADS
6.0000 | MEDICATED_PAD | Freq: Every day | CUTANEOUS | Status: DC
  Administered 2023-04-18 – 2023-04-25 (×7): 6 via TOPICAL

## 2023-04-17 NOTE — Assessment & Plan Note (Addendum)
Patient was recently seen in the ED for generalized abdominal pain, found to have multiple new lesions throughout the liver concerning for recurrence of prostate cancer.  Elevated LFTs are likely due to this. Stable at this time.

## 2023-04-17 NOTE — Progress Notes (Addendum)
   Informed consent signed and in chart.    TX duration: 3.5 hrs     Hand-off given to patient's nurse.    Access used: R CVC Access issues: None   Total UF removed: 2000 mL Medication(s) given: Vancomycin Post HD VS: WNL Post HD weight: 98.5 kg      [facility]

## 2023-04-17 NOTE — Progress Notes (Signed)
[facility]  ROUNDING NOTE   Subjective:   [name] was admitted to [facility] on 04/17/2023 for Cellulitis of right leg [L03.115]  Last hemodialysis treatment was Friday, August 23.    Objective:  Vital signs in last 24 hours:  Temp:  [97.7 F (36.5 C)] 97.7 F (36.5 C) (08/26 0910) Pulse Rate:  [64-92] 83 (08/26 1430) Resp:  [11-23] 18 (08/26 1430) BP: (89-113)/(64-80) 104/80 (08/26 1430) SpO2:  [91 %-100 %] 96 % (08/26 1430) Weight:  [99 kg] 99 kg (08/26 0906)  Weight change:  Filed Weights   04/17/23 0906  Weight: 99 kg    Intake/Output: No intake/output data recorded.   Intake/Output this shift:  Total I/O In: 250 [IV Piggyback:250] Out: -   Physical Exam: General: NAD,   Head: Normocephalic, atraumatic. Moist oral mucosal membranes  Eyes: Anicteric, PERRL  Neck: Supple, trachea midline  Lungs:  Clear to auscultation  Heart: Regular rate and rhythm  Abdomen:  Soft, nontender,   Extremities:  Left LE ++ weeping edema, erythema and induration  Neurologic: Nonfocal, moving all four extremities  Skin: No lesions  Access: RIJ permcath    Basic Metabolic Panel: Recent Labs  Lab 04/11/23 0928 04/17/23 0933  NA 135 138  K 3.5 3.8  CL 102 100  CO2 22 21*  GLUCOSE 91 70  BUN 28* 44*  CREATININE 6.22* 7.42*  CALCIUM 8.0* 8.7*    Liver Function Tests: Recent Labs  Lab 04/11/23 0928 04/17/23 0933  AST 91* 117*  ALT 64* 69*  ALKPHOS 156* 146*  BILITOT 1.1 0.9  PROT 6.2* 7.0  ALBUMIN 3.0* 3.3*   Recent Labs  Lab 04/11/23 0928  LIPASE 24   No results for input(s): "AMMONIA" in the last 168 hours.  CBC: Recent Labs  Lab 04/11/23 0928 04/17/23 0933  WBC 7.0 4.5  NEUTROABS  --  3.9  HGB 11.3* 13.2  HCT 34.6* 40.9  MCV 101.8* 101.5*  PLT 143* 153    Cardiac Enzymes: No results for input(s): "CKTOTAL", "CKMB", "CKMBINDEX", "TROPONINI" in the last 168 hours.  BNP: Invalid input(s): "POCBNP"  CBG: No results for  input(s): "GLUCAP" in the last 168 hours.  Microbiology: Results for orders placed or performed during the hospital encounter of 04/17/23  SARS Coronavirus 2 by RT PCR (hospital order, performed in [facility] hospital lab) *cepheid single result test* Anterior Nasal Swab     Status: None   Collection Time: 04/17/23 10:57 AM   Specimen: Anterior Nasal Swab  Result Value Ref Range Status   SARS Coronavirus 2 by RT PCR NEGATIVE NEGATIVE Final    Comment: (NOTE) SARS-CoV-2 target nucleic acids are NOT DETECTED.  The SARS-CoV-2 RNA is generally detectable in upper and lower respiratory specimens during the acute phase of infection. The lowest concentration of SARS-CoV-2 viral copies this assay can detect is 250 copies / mL. A negative result does not preclude SARS-CoV-2 infection and should not be used as the sole basis for treatment or other patient management decisions.  A negative result may occur with improper specimen collection / handling, submission of specimen other than nasopharyngeal swab, presence of viral mutation(s) within the areas targeted by this assay, and inadequate number of viral copies (<250 copies / mL). A negative result must be combined with clinical observations, patient history, and epidemiological information.  Fact Sheet for Patients:   RoadLapTop.co.za  Fact Sheet for Healthcare Providers: http://kim-miller.com/  This test is not yet approved or  cleared by the  [place] [employer] and has been authorized for detection and/or diagnosis of SARS-CoV-2 by FDA under an TEFL teacher (EUA).  This EUA will remain in effect (meaning this test can be used) for the duration of the COVID-19 declaration under Section 564(b)(1) of the Act, 21 U.S.C. section 360bbb-3(b)(1), unless the authorization is terminated or revoked sooner.  Performed at [facility], [address] [place], [place] [address]      Coagulation Studies: No results for input(s): "LABPROT", "INR" in the last 72 hours.  Urinalysis: No results for input(s): "COLORURINE", "LABSPEC", "PHURINE", "GLUCOSEU", "HGBUR", "BILIRUBINUR", "KETONESUR", "PROTEINUR", "UROBILINOGEN", "NITRITE", "LEUKOCYTESUR" in the last 72 hours.  Invalid input(s): "APPERANCEUR"    Imaging: DG Tibia/Fibula Right  Result Date: 04/17/2023 CLINICAL DATA:  Shortness of breath.  Leg pain EXAM: RIGHT TIBIA AND FIBULA - 2 VIEW COMPARISON:  11/27/2021 FINDINGS: Prior ORIF of the proximal right tibia and distal femur. There is no evidence of acute fracture or other focal bone lesions. Healed fracture deformity of the proximal fibula. Generalized soft tissue swelling. Atherosclerotic vascular calcifications. IMPRESSION: Generalized soft tissue swelling.  No acute findings. Electronically Signed   By: Duanne Guess D.O.   On: 04/17/2023 13:41   DG Chest Portable 1 View  Result Date: 04/17/2023 CLINICAL DATA:  Shortness of breath.  Evaluate for edema. EXAM: PORTABLE CHEST 1 VIEW COMPARISON:  03/20/2023 FINDINGS: There is a right chest wall dialysis catheter with tips in the right atrium. Previous median sternotomy and CABG procedure. Heart size and mediastinal contours appear normal. Lung volumes are low. Scar versus platelike atelectasis noted in the left base. No pleural effusion, interstitial edema or airspace disease. IMPRESSION: 1. No signs of interstitial edema or pleural effusion. 2. Decreased lung volumes with bibasilar scar versus platelike atelectasis. 3. Low lung volumes. Electronically Signed   By: Signa Kell M.D.   On: 04/17/2023 13:13   US Venous Img Lower Unilateral Right  Result Date: 04/17/2023 CLINICAL DATA:  Right lower extremity edema. EXAM: RIGHT LOWER EXTREMITY VENOUS DOPPLER ULTRASOUND TECHNIQUE: Gray-scale sonography with graded compression, as well as color Doppler and duplex ultrasound were performed to evaluate the lower extremity  deep venous systems from the level of the common femoral vein and including the common femoral, femoral, profunda femoral, popliteal and calf veins including the posterior tibial, peroneal and gastrocnemius veins when visible. The superficial great saphenous vein was also interrogated. Spectral Doppler was utilized to evaluate flow at rest and with distal augmentation maneuvers in the common femoral, femoral and popliteal veins. COMPARISON:  None Available. FINDINGS: Contralateral Common Femoral Vein: Respiratory phasicity is normal and symmetric with the symptomatic side. No evidence of thrombus. Normal compressibility. Common Femoral Vein: No evidence of thrombus. Normal compressibility, respiratory phasicity and response to augmentation. Saphenofemoral Junction: No evidence of thrombus. Normal compressibility and flow on color Doppler imaging. Profunda Femoral Vein: No evidence of thrombus. Normal compressibility and flow on color Doppler imaging. Femoral Vein: No evidence of thrombus. Normal compressibility, respiratory phasicity and response to augmentation. Popliteal Vein: No evidence of thrombus. Normal compressibility, respiratory phasicity and response to augmentation. Calf Veins: No evidence of thrombus. Normal compressibility and flow on color Doppler imaging. Superficial Great Saphenous Vein: No evidence of thrombus. Normal compressibility. Venous Reflux:  None. Other Findings: No evidence of superficial thrombophlebitis or abnormal fluid collection. There is a single visualized right inguinal lymph node measuring roughly 3.0 x 1.9 x 2.0 cm that demonstrates atypical morphology without a visualized fatty hilum. This is nonspecific and  may still potentially represent a reactive lymph node. Lymph node pathology cannot be excluded based on sonographic appearance. IMPRESSION: 1. No evidence of right lower extremity deep venous thrombosis. 2. Single visualized right inguinal lymph node measuring roughly 2 cm  in short axis. This is nonspecific and may represent a reactive lymph node. Lymph node pathology cannot be excluded based on atypical sonographic appearance. Electronically Signed   By: Irish Lack M.D.   On: 04/17/2023 12:11     Medications:    [START ON 04/18/2023] cefTRIAXone (ROCEPHIN)  IV     vancomycin 2,000 mg (04/17/23 1441)    apixaban  2.5 mg Oral BID   atorvastatin  80 mg Oral QHS   [START ON 04/18/2023] bisacodyl  10 mg Oral Daily   calcium carbonate  500 mg Oral BID   [START ON 04/18/2023] Chlorhexidine Gluconate Cloth  6 each Topical Q0600   [START ON 04/18/2023] folic acid  1 mg Oral Daily   gabapentin  100 mg Oral QHS   melatonin  5 mg Oral QHS   pantoprazole  40 mg Oral Q12H   polyethylene glycol  17 g Oral QHS   [START ON 04/18/2023] predniSONE  10 mg Oral Q breakfast   senna-docusate  2 tablet Oral BID   simethicone  80 mg Oral BID   sodium chloride flush  3 mL Intravenous Q12H   tamsulosin  0.4 mg Oral QHS   [START ON 04/18/2023] torsemide  60 mg Oral Daily   acetaminophen **OR** acetaminophen, HYDROmorphone (DILAUDID) injection, ondansetron **OR** ondansetron (ZOFRAN) IV, oxyCODONE, polyethylene glycol  Assessment/ Plan:  [name] is a 67 y.o.  male with end stage renal disease on hemodialysis, hypertension, diabetes mellitus type II, hyperlipidemia, atrial fibrillation, coronary artery disease status post CABG, peptic ulcer disease and prostate cancer who is admitted to [facility] on 04/17/2023 for Cellulitis of right leg [L03.115]  [facility] MWF  End Stage Renal Disease: requiring four days a week due to volume control - dialysis for today, orders prepared.   Hypertension with chronic kidney disease: hypotensive on encounter.  - restarted tamsulosin - holding home carvedilol, torsemide, and amlodipine.   Anemia of chronic kidney disease: hemoglobin currently at goal. 13.2. Avoid ESA for now.   Secondary Hyperparathyroidism: not currently phosphate  binders.    LOS: 0 Sylver Vantassell 8/26/20242:58 PM

## 2023-04-17 NOTE — Assessment & Plan Note (Signed)
-   SSI, very sensitive

## 2023-04-17 NOTE — Consult Note (Signed)
PHARMACY -  BRIEF ANTIBIOTIC NOTE   Pharmacy has received consult(s) for Vancomycin and Cefepime from an ED provider.  The patient's profile has been reviewed for ht/wt/allergies/indication/available labs.    One time order(s) placed for Vancomycin 2g IV and Cefepime 2g IV.  Further antibiotics/pharmacy consults should be ordered by admitting physician if indicated.                       Thank you, Bettey Costa 04/17/2023  12:34 PM

## 2023-04-17 NOTE — Assessment & Plan Note (Signed)
Patient has a history of hypertension, with somewhat labile blood pressure per previous chart review.  Currently, blood pressure is on the lower end of what it typically is.  - Hold home antihypertensives

## 2023-04-17 NOTE — Assessment & Plan Note (Signed)
-   Continue home low-dose Eliquis

## 2023-04-17 NOTE — Telephone Encounter (Signed)
Mr. Blot [facility] GU team has acknowledge message and he has follow as previously scheduled with them on 05/03/23.

## 2023-04-17 NOTE — ED Provider Notes (Signed)
[facility] Provider Note    Event Date/Time   First MD Initiated Contact with Patient 04/17/23 [phone]     (approximate)   History   Leg Pain   HPI  Micheal Aguirre is a 67 y.o. male ESRD on dialysis not having received dialysis today presents to the ER for evaluation of worsening right leg pain and swelling.  Does have chronic wound to that area feels like it became more painful swelling over the past 24 hours.  Denies any fall or injury.  Does have remote right knee replacement.  States the pain is mild to moderate.  Took an oxycodone at home.     Physical Exam   Triage Vital Signs: ED Triage Vitals  Encounter Vitals Group     BP 04/17/23 0910 90/78     Systolic BP Percentile --      Diastolic BP Percentile --      Pulse Rate 04/17/23 0910 70     Resp 04/17/23 0910 17     Temp 04/17/23 0910 97.7 F (36.5 C)     Temp Source 04/17/23 0910 Oral     SpO2 04/17/23 0910 100 %     Weight 04/17/23 0906 218 lb 4.1 oz (99 kg)     Height 04/17/23 0906 5\' 9"  (1.753 m)     Head Circumference --      Peak Flow --      Pain Score 04/17/23 0906 8     Pain Loc --      Pain Education --      Exclude from Growth Chart --     Most recent vital signs: Vitals:   04/17/23 1130 04/17/23 1200  BP: 91/76 106/78  Pulse:  79  Resp: (!) 23 11  Temp:    SpO2:  96%     Constitutional: Alert  Eyes: Conjunctivae are normal.  Head: Atraumatic. Nose: No congestion/rhinnorhea. Mouth/Throat: Mucous membranes are moist.   Neck: Painless ROM.  Cardiovascular:   Good peripheral circulation. Respiratory: Normal respiratory effort.  No retractions.  Gastrointestinal: Soft and nontender.  Musculoskeletal: Significant swelling to the right lower extremity with some warmth pitting edema.  Chronic appearing wound to the posterior lower leg. Neurologic:  MAE spontaneously. No gross focal neurologic deficits are appreciated.  Skin:  Skin is warm, dry and intact. No rash  noted. Psychiatric: Mood and affect are normal. Speech and behavior are normal.    ED Results / Procedures / Treatments   Labs (all labs ordered are listed, but only abnormal results are displayed) Labs Reviewed  CBC WITH DIFFERENTIAL/PLATELET - Abnormal; Notable for the following components:      Result Value   RBC 4.03 (*)    MCV 101.5 (*)    nRBC 1.1 (*)    Lymphs Abs 0.3 (*)    All other components within normal limits  COMPREHENSIVE METABOLIC PANEL - Abnormal; Notable for the following components:   CO2 21 (*)    BUN 44 (*)    Creatinine, Ser 7.42 (*)    Calcium 8.7 (*)    Albumin 3.3 (*)    AST 117 (*)    ALT 69 (*)    Alkaline Phosphatase 146 (*)    GFR, Estimated 7 (*)    Anion gap 17 (*)    All other components within normal limits  LACTIC ACID, PLASMA - Abnormal; Notable for the following components:   Lactic Acid, Venous 3.2 (*)    All other components  within normal limits  SARS CORONAVIRUS 2 BY RT PCR  CULTURE, BLOOD (ROUTINE X 2)  CULTURE, BLOOD (ROUTINE X 2)  LACTIC ACID, PLASMA     EKG     RADIOLOGY Please see ED Course for my review and interpretation.  I personally reviewed all radiographic images ordered to evaluate for the above acute complaints and reviewed radiology reports and findings.  These findings were personally discussed with the patient.  Please see medical record for radiology report.    PROCEDURES:  Critical Care performed:   Procedures   MEDICATIONS ORDERED IN ED: Medications  ceFEPIme (MAXIPIME) 2 g in sodium chloride 0.9 % 100 mL IVPB (has no administration in time range)  metroNIDAZOLE (FLAGYL) IVPB 500 mg (has no administration in time range)  vancomycin (VANCOREADY) IVPB 2000 mg/400 mL (has no administration in time range)  sodium chloride 0.9 % bolus 250 mL (0 mLs Intravenous Stopped 04/17/23 1227)     IMPRESSION / MDM / ASSESSMENT AND PLAN / ED COURSE  I reviewed the triage vital signs and the nursing notes.                               Differential diagnosis includes, but is not limited to, chf, cellulitis, dvt, wound, fracture, arthritis  Patient presenting to the ER for evaluation of symptoms as described above.  Based on symptoms, risk factors and considered above differential, this presenting complaint could reflect a potentially life-threatening illness therefore the patient will be placed on continuous pulse oximetry and telemetry for monitoring.  Laboratory evaluation will be sent to evaluate for the above complaints.      Clinical Course as of 04/17/23 1316  Mon Apr 17, 2023  1031 Lactate is significantly elevated at 3.2 however this may be chronically elevated in setting of liver and renal disease.  Blood pressure is mildly low will give gentle IV fluid challenge.  No other markers of sepsis.  Concern for possible DVT.  Will await imaging results. [PR]  1040 Will give small bolus of IV fluids as patient is on dialysis.  Imaging without evidence of DVT.  X-ray on my review and interpretation without evidence of fracture.  Concerned that his findings are secondary to cellulitis.  Will have ordered IV antibiotics.  Will consult hospitalist for admission. [PR]    Clinical Course User Index [PR] Willy Eddy, MD     FINAL CLINICAL IMPRESSION(S) / ED DIAGNOSES   Final diagnoses:  Right leg pain  Cellulitis of right lower extremity     Rx / DC Orders   ED Discharge Orders     None        Note:  This document was prepared using Dragon voice recognition software and may include unintentional dictation errors.    Willy Eddy, MD 04/17/23 [phone]

## 2023-04-17 NOTE — H&P (Signed)
History and Physical    Patient: Micheal Aguirre VOZ:[id number] DOB: 10/02/1955 DOA: 04/17/2023 DOS: the patient was seen and examined on 04/17/2023 PCP: [facility]  Patient coming from: SNF  Chief Complaint:  Chief Complaint  Patient presents with   Leg Pain   HPI: Micheal Aguirre is a 67 y.o. 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  Mr. Uzzle states that over the last couple days, he has been having weeping from the inside of his right lower leg.  Then today, he woke up with severe pain and swelling of his entire right lower extremity.  He states that the pain is so severe, he cannot even move his leg or bear weight.  Denies any other symptoms at this time including fever, chills, nausea, vomiting, diarrhea, chest pain, palpitations or shortness of breath.  He has not missed any dialysis days but is due for dialysis today.  He denies any prior history of gout or similar symptoms.  ED course: On arrival to the ED, patient was hypotensive at 89/70 with heart rate of 71.  He was saturating at 98% room air.  He was afebrile at 97.7. Initial workup notable for normal WBC of 4.5, bicarb 21, BUN 44, creatinine 7.42, alkaline phosphatase 146, AST 117, ALT 69 and GFR of 7.  Lactic acid 3.2.  COVID-19 negative.  Right lower extremity Doppler was obtained and negative for DVT.  Chest x-ray and right tib-fib x-ray obtained with results still pending.  Patient started on cefepime, Flagyl and vancomycin.  TRH contacted for admission.  Review of Systems: As mentioned in the history of present illness. All other systems reviewed and are negative.  Past Medical History:  Diagnosis Date    Diabetes mellitus without complication (HCC)    ESRD (end stage renal disease) (HCC)    Hypercholesteremia    Hypertension    Renal disorder    Past Surgical History:  Procedure Laterality Date   ORIF FEMUR FRACTURE Right 02/03/2022   Procedure: OPEN REDUCTION INTERNAL FIXATION (ORIF) DISTAL FEMUR FRACTURE;  Surgeon: Christena Flake, MD;  Location: [facility] ORS;  Service: Orthopedics;  Laterality: Right;   ORIF TIBIA FRACTURE Right 11/27/2021   Procedure: OPEN REDUCTION INTERNAL FIXATION (ORIF) TIBIA FRACTURE;  Surgeon: Juanell Fairly, MD;  Location: [facility] ORS;  Service: Orthopedics;  Laterality: Right;   Social History:  reports that he has quit smoking. His smoking use included cigarettes. He has never used smokeless tobacco. He reports that he does not currently use alcohol. He reports that he does not use drugs.  Allergies  Allergen Reactions   Hydralazine     Other reaction(s): Kidney Disorder, Other (See Comments) Concern for drug-induced AIN in 06/2021    Lisinopril Swelling and Other (See Comments)    Angioedema    Bimatoprost     Other reaction(s): Unknown   Empagliflozin Other (See Comments)    UTI Other reaction(s): uti     Family History  Problem Relation Age of Onset   Heart disease Mother    Heart disease Father     Prior to Admission medications   Medication Sig Start Date End Date Taking? Authorizing Provider  acetaminophen (TYLENOL) 500 MG tablet Take 1,000 mg by mouth every 8 (  eight) hours as needed for mild pain or moderate pain.    [provider]  amLODipine (NORVASC) 5 MG tablet Take 5 mg by mouth daily.    [provider]  apixaban (ELIQUIS) 2.5 MG TABS tablet Take 1 tablet (2.5 mg total) by mouth 2 (two) times daily. 02/05/22   Evon Slack, PA-C  aspirin EC 81 MG tablet Take 81 mg by mouth 2 (two) times daily. Swallow whole.    [provider]  atorvastatin (LIPITOR) 80 MG tablet Take 80 mg by mouth daily.    [provider]  bisacodyl (DULCOLAX) 5 MG EC tablet Take 10 mg by mouth daily.    [provider]  calcium carbonate (TUMS - DOSED IN MG ELEMENTAL CALCIUM) 500 MG chewable tablet Chew 500 mg by mouth 2 (two) times daily.    [provider]  carvedilol (COREG) 25 MG tablet Take 25 mg by mouth 2 (two) times daily with a meal.    [provider]  famotidine (PEPCID) 10 MG tablet Take 1 tablet (10 mg total) by mouth daily for 7 days. 06/12/22 06/19/22  Shaune Pollack, MD  ferrous fumarate-b12-vitamic C-folic acid (TRINSICON / FOLTRIN) capsule Take 1 capsule by mouth 2 (two) times daily after a meal. 02/05/22   Evon Slack, PA-C  gabapentin (NEURONTIN) 100 MG capsule Take 100 mg by mouth at bedtime.    [provider]  LANTUS SOLOSTAR 100 UNIT/ML Solostar Pen Inject 8 Units into the skin at bedtime. HOLD FOR BLOOD SUGAR LESS THAN 120 01/22/22   [provider]  melatonin 3 MG TABS tablet Take 6 mg by mouth at bedtime.    [provider]  methocarbamol (ROBAXIN) 500 MG tablet Take 1 tablet (500 mg total) by mouth every 6 (six) hours as needed for muscle spasms. 12/02/21   Lewie Chamber, MD  Multiple Vitamins-Minerals (MULTIVITAMIN WITH MINERALS) tablet Take 1 tablet by mouth every evening.    [provider]  NOVOLOG FLEXPEN 100 UNIT/ML FlexPen Inject 4 Units into the skin daily with lunch. 01/30/22   [provider]  oxybutynin (DITROPAN) 5 MG tablet Take 5 mg by mouth every 8 (eight) hours as needed for bladder spasms.    [provider]  oxyCODONE (ROXICODONE) 5 MG immediate release tablet Take 1 tablet (5 mg total) by mouth every 8 (eight) hours as needed. 04/11/23 04/10/24  Minna Antis, MD  pantoprazole (PROTONIX) 40 MG tablet Take 40 mg by mouth every 12 (twelve) hours.    [provider]  polyethylene glycol (MIRALAX / GLYCOLAX) 17 g packet Take 17 g by mouth at bedtime.    [provider]   predniSONE (DELTASONE) 10 MG tablet Take 10 mg by mouth daily with breakfast.    [provider]  senna-docusate (SENOKOT-S) 8.6-50 MG tablet Take 2 tablets by mouth 2 (two) times daily.    [provider]  simethicone (MYLICON) 80 MG chewable tablet Chew 80 mg by mouth every 6 (six) hours.    [provider]  tamsulosin (FLOMAX) 0.4 MG CAPS capsule Take 0.4 mg by mouth at bedtime.    [provider]  torsemide (DEMADEX) 20 MG tablet Take 20 mg by mouth 2 (two) times daily.    [provider]  Vitamin D, Ergocalciferol, (DRISDOL) 1.25 MG (50000 UNIT) CAPS capsule Take 50,000 Units by mouth every Saturday.    [provider]  zinc oxide 20 % ointment Apply 1 application. topically every 8 (  eight) hours as needed (peri-skin care).    [provider]    Physical Exam: Vitals:   04/17/23 1100 04/17/23 1130 04/17/23 1200 04/17/23 1230  BP: 91/64 91/76 106/78 113/74  Pulse:   79 64  Resp: 19 (!) 23 11 18   Temp:      TempSrc:      SpO2:   96% 95%  Weight:      Height:       Physical Exam Vitals and nursing note reviewed.  Constitutional:      General: He is not in acute distress.    Appearance: He is obese.  HENT:     Mouth/Throat:     Mouth: Mucous membranes are moist.     Pharynx: Oropharynx is clear.  Eyes:     Conjunctiva/sclera: Conjunctivae normal.     Pupils: Pupils are equal, round, and reactive to light.  Cardiovascular:     Rate and Rhythm: Normal rate and regular rhythm.     Heart sounds: No murmur heard. Pulmonary:     Effort: Pulmonary effort is normal. No respiratory distress.     Breath sounds: Normal breath sounds. No wheezing, rhonchi or rales.  Abdominal:     General: Bowel sounds are normal.     Palpations: Abdomen is soft.  Musculoskeletal:        General: Tenderness present.     Right lower leg: Edema (significant non-pitting edema) present.     Left lower leg: No edema.  Skin:    General:  Skin is warm and dry.     Comments: Right lower extremity: Significant uniform swelling and exquisite tenderness to palpation. Subtle erythema noted particularly over the medial inferior aspect where is skin breakdown with serous drainage. Erythema extends upwards. Increased warmth to touch.  Please see picture below.   Neurological:     Mental Status: He is alert and oriented to person, place, and time.  Psychiatric:        Mood and Affect: Mood normal.        Behavior: Behavior normal.         Data Reviewed: CBC with WBC of 4.5, hemoglobin 13.2, MCV of 101.5, platelets of 153 CMP with sodium of 130, potassium 3.8, bicarb 21, glucose 70, BUN 44, creatinine 7.42, anion gap 17, alkaline phosphatase 146, AST 117, ALT 69 and GFR of 7 Lactic acid 3.2 COVID-19 PCR negative  DG Tibia/Fibula Right  Result Date: 04/17/2023 CLINICAL DATA:  Shortness of breath.  Leg pain EXAM: RIGHT TIBIA AND FIBULA - 2 VIEW COMPARISON:  11/27/2021 FINDINGS: Prior ORIF of the proximal right tibia and distal femur. There is no evidence of acute fracture or other focal bone lesions. Healed fracture deformity of the proximal fibula. Generalized soft tissue swelling. Atherosclerotic vascular calcifications. IMPRESSION: Generalized soft tissue swelling.  No acute findings. Electronically Signed   By: Duanne Guess D.O.   On: 04/17/2023 13:41   DG Chest Portable 1 View  Result Date: 04/17/2023 CLINICAL DATA:  Shortness of breath.  Evaluate for edema. EXAM: PORTABLE CHEST 1 VIEW COMPARISON:  03/20/2023 FINDINGS: There is a right chest wall dialysis catheter with tips in the right atrium. Previous median sternotomy and CABG procedure. Heart size and mediastinal contours appear normal. Lung volumes are low. Scar versus platelike atelectasis noted in the left base. No pleural effusion, interstitial edema or airspace disease. IMPRESSION: 1. No signs of interstitial edema or pleural effusion. 2. Decreased lung volumes with  bibasilar scar versus platelike atelectasis. 3.  Low lung volumes. Electronically Signed   By: Signa Kell M.D.   On: 04/17/2023 13:13   US Venous Img Lower Unilateral Right  Result Date: 04/17/2023 CLINICAL DATA:  Right lower extremity edema. EXAM: RIGHT LOWER EXTREMITY VENOUS DOPPLER ULTRASOUND TECHNIQUE: Gray-scale sonography with graded compression, as well as color Doppler and duplex ultrasound were performed to evaluate the lower extremity deep venous systems from the level of the common femoral vein and including the common femoral, femoral, profunda femoral, popliteal and calf veins including the posterior tibial, peroneal and gastrocnemius veins when visible. The superficial great saphenous vein was also interrogated. Spectral Doppler was utilized to evaluate flow at rest and with distal augmentation maneuvers in the common femoral, femoral and popliteal veins. COMPARISON:  None Available. FINDINGS: Contralateral Common Femoral Vein: Respiratory phasicity is normal and symmetric with the symptomatic side. No evidence of thrombus. Normal compressibility. Common Femoral Vein: No evidence of thrombus. Normal compressibility, respiratory phasicity and response to augmentation. Saphenofemoral Junction: No evidence of thrombus. Normal compressibility and flow on color Doppler imaging. Profunda Femoral Vein: No evidence of thrombus. Normal compressibility and flow on color Doppler imaging. Femoral Vein: No evidence of thrombus. Normal compressibility, respiratory phasicity and response to augmentation. Popliteal Vein: No evidence of thrombus. Normal compressibility, respiratory phasicity and response to augmentation. Calf Veins: No evidence of thrombus. Normal compressibility and flow on color Doppler imaging. Superficial Great Saphenous Vein: No evidence of thrombus. Normal compressibility. Venous Reflux:  None. Other Findings: No evidence of superficial thrombophlebitis or abnormal fluid collection. There  is a single visualized right inguinal lymph node measuring roughly 3.0 x 1.9 x 2.0 cm that demonstrates atypical morphology without a visualized fatty hilum. This is nonspecific and may still potentially represent a reactive lymph node. Lymph node pathology cannot be excluded based on sonographic appearance. IMPRESSION: 1. No evidence of right lower extremity deep venous thrombosis. 2. Single visualized right inguinal lymph node measuring roughly 2 cm in short axis. This is nonspecific and may represent a reactive lymph node. Lymph node pathology cannot be excluded based on atypical sonographic appearance. Electronically Signed   By: Irish Lack M.D.   On: 04/17/2023 12:11    Results are pending, will review when available.  Assessment and Plan:  Cellulitis of right leg Patient is presenting with acute on chronic swelling and exquisite pain involving the right lower extremity with evidence of small ulcers forming on the medial inferior aspect with some drainage.  Primary differential at this point is atypical cellulitis given erythema is quite subtle.  Differential also includes early stages of calciphylaxis.  Pulses are intact and Doppler is negative for DVT.  No evidence of sepsis at this time; patient's elevated lactic acid is likely due to renal and hepatic dysfunction.  - Continue broad-spectrum antibiotics with Rocephin, Flagyl and vancomycin - Blood cultures obtained in the ED and pending - Tylenol as needed for fever - Dilaudid as needed for pain control  ESRD on dialysis [facility]) - Nephrology consulted; appreciate their recommendations - Dialysis management per nephrology  Prostate cancer [facility]) Patient has a history of prostate cancer with metastasis to lymph nodes and bone, currently suspected to have a recurrence given increase in PSA and evidence of new hepatic lesions.  - Continue outpatient follow-up with oncology  Chronic diastolic CHF (congestive heart failure) (HCC) Patient  appears relatively euvolemic at this time with the exception of right leg swelling, which is likely due to infection.  - Continue home regimen - Daily weights  HTN (hypertension) Patient has a history of hypertension, with somewhat labile blood pressure per previous chart review.  Currently, blood pressure is on the lower end of what it typically is.  - Hold home antihypertensives  Elevated LFTs Patient was recently seen in the ED for generalized abdominal pain, found to have multiple new lesions throughout the liver concerning for recurrence of prostate cancer.  Elevated LFTs are likely due to this. Stable at this time.   Type II diabetes mellitus with renal manifestations (HCC) - SSI, very sensitive  Atrial fibrillation, chronic (HCC) - Continue home low-dose Eliquis   Advance Care Planning:   Code Status: Full Code verified by patient  Consults: Nephrology  Family Communication: no family at bedside.   Severity of Illness: The appropriate patient status for this patient is OBSERVATION. Observation status is judged to be reasonable and necessary in order to provide the required intensity of service to ensure the patient's safety. The patient's presenting symptoms, physical exam findings, and initial radiographic and laboratory data in the context of their medical condition is felt to place them at decreased risk for further clinical deterioration. Furthermore, it is anticipated that the patient will be medically stable for discharge from the hospital within 2 midnights of admission.   Author: Verdene Lennert, MD 04/17/2023 2:14 PM  For on call review [email or web address].

## 2023-04-17 NOTE — Assessment & Plan Note (Signed)
Patient has a history of prostate cancer with metastasis to lymph nodes and bone, currently suspected to have a recurrence given increase in PSA and evidence of new hepatic lesions.  - Continue outpatient follow-up with oncology

## 2023-04-17 NOTE — Assessment & Plan Note (Addendum)
Patient is presenting with acute on chronic swelling and exquisite pain involving the right lower extremity with evidence of small ulcers forming on the medial inferior aspect with some drainage.  Primary differential at this point is atypical cellulitis given erythema is quite subtle.  Differential also includes early stages of calciphylaxis.  Pulses are intact and Doppler is negative for DVT.  No evidence of sepsis at this time; patient's elevated lactic acid is likely due to renal and hepatic dysfunction.  - Continue broad-spectrum antibiotics with Rocephin, Flagyl and vancomycin - Blood cultures obtained in the ED and pending - Tylenol as needed for fever - Dilaudid as needed for pain control

## 2023-04-17 NOTE — ED Notes (Signed)
Call made to ED by dialysis due to completion of therapy and the need to send pt back to ED. Dialysis informed that pt is assigned to room 216 and that they would call 2C unit to provided that assigned RN with report as well as setting up transport.

## 2023-04-17 NOTE — Assessment & Plan Note (Signed)
Patient appears relatively euvolemic at this time with the exception of right leg swelling, which is likely due to infection.  - Continue home regimen - Daily weights

## 2023-04-17 NOTE — Consult Note (Signed)
Pharmacy Antibiotic Note  Micheal Aguirre is a 67 y.o. male admitted on 04/17/2023 with  leg pain . 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  Pharmacy has been consulted for Vancomycin dosing.  Plan: - Vancomycin 2g IV x 1 as loading dose - Patient receiving dialysis today, will order additional 1g dose to be given at the end of the session.  - Will continue to monitor dialysis schedule daily until regimen is established by nephrology.   Height: 5\' 9"  (175.3 cm) Weight: 99 kg (218 lb 4.1 oz) IBW/kg (Calculated) : 70.7  Temp (24hrs), Avg:97.5 F (36.4 C), Min:97.3 F (36.3 C), Max:97.7 F (36.5 C)  Recent Labs  Lab 04/11/23 0928 04/17/23 0933 04/17/23 1444  WBC 7.0 4.5  --   CREATININE 6.22* 7.42*  --   LATICACIDVEN  --  3.2* 2.9*    Estimated Creatinine Clearance: 11.2 mL/min (A) (by C-G formula based on SCr of 7.42 mg/dL (H)).    Allergies  Allergen Reactions   Hydralazine     Other reaction(s): Kidney Disorder, Other (See Comments) Concern for drug-induced AIN in 06/2021    Lisinopril Swelling and Other (See Comments)    Angioedema    Bimatoprost     Other reaction(s): Unknown   Empagliflozin Other (See Comments)    UTI Other reaction(s): uti     Antimicrobials this admission: Vancomycin 8/26 >>  Cefepime 8/26 x1  Dose adjustments this admission: N/A  Microbiology results: 8/26 BCx: collected  Thank you for allowing pharmacy to be a part of this patient's care.  Bettey Costa 04/17/2023 3:44 PM

## 2023-04-17 NOTE — ED Triage Notes (Addendum)
Pt arrives from [facility] via [facility] w/ c/o R leg pain from the knee down. EMS reports pt had knee surgery last year and pt has intermittent swelling to knee. Pt is a [facility] diaylsis pt, has not gone today.   VSS en route Had oxycodone at 0300

## 2023-04-17 NOTE — Assessment & Plan Note (Addendum)
-   Nephrology consulted; appreciate their recommendations - Dialysis management per nephrology

## 2023-04-17 NOTE — Progress Notes (Signed)
Call made to ED, pt room assignment changed. ED charge nurse asked to inform dialysis unit of any changes in patient room assignment when it happens.

## 2023-04-17 NOTE — ED Notes (Signed)
Daughter Jeton Sibbett updated on pt status and plan of care with the permission of pt. No further questions to this RN. She "would like frequent updates".

## 2023-04-18 DIAGNOSIS — D631 Anemia in chronic kidney disease: Secondary | ICD-10-CM | POA: Diagnosis present

## 2023-04-18 DIAGNOSIS — C7951 Secondary malignant neoplasm of bone: Secondary | ICD-10-CM | POA: Diagnosis present

## 2023-04-18 DIAGNOSIS — N2581 Secondary hyperparathyroidism of renal origin: Secondary | ICD-10-CM | POA: Diagnosis present

## 2023-04-18 DIAGNOSIS — Z7901 Long term (current) use of anticoagulants: Secondary | ICD-10-CM | POA: Diagnosis not present

## 2023-04-18 DIAGNOSIS — I482 Chronic atrial fibrillation, unspecified: Secondary | ICD-10-CM | POA: Diagnosis present

## 2023-04-18 DIAGNOSIS — I959 Hypotension, unspecified: Secondary | ICD-10-CM | POA: Diagnosis present

## 2023-04-18 DIAGNOSIS — Z1152 Encounter for screening for COVID-19: Secondary | ICD-10-CM | POA: Diagnosis not present

## 2023-04-18 DIAGNOSIS — Z87891 Personal history of nicotine dependence: Secondary | ICD-10-CM | POA: Diagnosis not present

## 2023-04-18 DIAGNOSIS — C779 Secondary and unspecified malignant neoplasm of lymph node, unspecified: Secondary | ICD-10-CM | POA: Diagnosis present

## 2023-04-18 DIAGNOSIS — I255 Ischemic cardiomyopathy: Secondary | ICD-10-CM | POA: Diagnosis present

## 2023-04-18 DIAGNOSIS — E669 Obesity, unspecified: Secondary | ICD-10-CM | POA: Diagnosis present

## 2023-04-18 DIAGNOSIS — I132 Hypertensive heart and chronic kidney disease with heart failure and with stage 5 chronic kidney disease, or end stage renal disease: Secondary | ICD-10-CM | POA: Diagnosis present

## 2023-04-18 DIAGNOSIS — E1122 Type 2 diabetes mellitus with diabetic chronic kidney disease: Secondary | ICD-10-CM | POA: Diagnosis present

## 2023-04-18 DIAGNOSIS — Z79899 Other long term (current) drug therapy: Secondary | ICD-10-CM | POA: Diagnosis not present

## 2023-04-18 DIAGNOSIS — C61 Malignant neoplasm of prostate: Secondary | ICD-10-CM | POA: Diagnosis present

## 2023-04-18 DIAGNOSIS — Z794 Long term (current) use of insulin: Secondary | ICD-10-CM | POA: Diagnosis not present

## 2023-04-18 DIAGNOSIS — N186 End stage renal disease: Secondary | ICD-10-CM | POA: Diagnosis present

## 2023-04-18 DIAGNOSIS — Z8249 Family history of ischemic heart disease and other diseases of the circulatory system: Secondary | ICD-10-CM | POA: Diagnosis not present

## 2023-04-18 DIAGNOSIS — E7800 Pure hypercholesterolemia, unspecified: Secondary | ICD-10-CM | POA: Diagnosis present

## 2023-04-18 DIAGNOSIS — C787 Secondary malignant neoplasm of liver and intrahepatic bile duct: Secondary | ICD-10-CM | POA: Diagnosis present

## 2023-04-18 DIAGNOSIS — I2510 Atherosclerotic heart disease of native coronary artery without angina pectoris: Secondary | ICD-10-CM | POA: Diagnosis present

## 2023-04-18 DIAGNOSIS — L03115 Cellulitis of right lower limb: Secondary | ICD-10-CM | POA: Diagnosis present

## 2023-04-18 DIAGNOSIS — Z992 Dependence on renal dialysis: Secondary | ICD-10-CM | POA: Diagnosis not present

## 2023-04-18 DIAGNOSIS — M79604 Pain in right leg: Secondary | ICD-10-CM | POA: Diagnosis present

## 2023-04-18 DIAGNOSIS — I5032 Chronic diastolic (congestive) heart failure: Secondary | ICD-10-CM | POA: Diagnosis present

## 2023-04-18 LAB — CBC
HCT: 38.8 % — ABNORMAL LOW (ref 39.0–52.0)
Hemoglobin: 12.6 g/dL — ABNORMAL LOW (ref 13.0–17.0)
MCH: 32.7 pg (ref 26.0–34.0)
MCHC: 32.5 g/dL (ref 30.0–36.0)
MCV: 100.8 fL — ABNORMAL HIGH (ref 80.0–100.0)
Platelets: 148 10*3/uL — ABNORMAL LOW (ref 150–400)
RBC: 3.85 MIL/uL — ABNORMAL LOW (ref 4.22–5.81)
RDW: 15.7 % — ABNORMAL HIGH (ref 11.5–15.5)
WBC: 5.8 10*3/uL (ref 4.0–10.5)
nRBC: 1.6 % — ABNORMAL HIGH (ref 0.0–0.2)

## 2023-04-18 LAB — HIV ANTIBODY (ROUTINE TESTING W REFLEX): HIV Screen 4th Generation wRfx: NONREACTIVE

## 2023-04-18 LAB — GLUCOSE, CAPILLARY
Glucose-Capillary: 213 mg/dL — ABNORMAL HIGH (ref 70–99)
Glucose-Capillary: 189 mg/dL — ABNORMAL HIGH (ref 70–99)
Glucose-Capillary: 209 mg/dL — ABNORMAL HIGH (ref 70–99)
Glucose-Capillary: 172 mg/dL — ABNORMAL HIGH (ref 70–99)

## 2023-04-18 MED ORDER — VANCOMYCIN HCL IN DEXTROSE 1-5 GM/200ML-% IV SOLN
1000.0000 mg | INTRAVENOUS | Status: DC
  Administered 2023-04-19: 1000 mg via INTRAVENOUS
  Filled 2023-04-18 (×2): qty 200

## 2023-04-18 NOTE — Plan of Care (Signed)
  Problem: Coping: Goal: Ability to adjust to condition or change in health will improve Outcome: Progressing   Problem: Fluid Volume: Goal: Ability to maintain a balanced intake and output will improve Outcome: Progressing   Problem: Metabolic: Goal: Ability to maintain appropriate glucose levels will improve Outcome: Progressing   Problem: Nutritional: Goal: Maintenance of adequate nutrition will improve Outcome: Progressing   Problem: Tissue Perfusion: Goal: Adequacy of tissue perfusion will improve Outcome: Progressing   

## 2023-04-18 NOTE — Progress Notes (Signed)
[facility]  ROUNDING NOTE   Subjective:   [name] was admitted to [facility] on 04/17/2023 for Right leg pain [M79.604] Cellulitis of right leg [L03.115] Cellulitis of right lower extremity [L03.115]  Patient seen sitting up in bed Denies pain from left leg Redness greatly reduced Edema remains present  Dialysis received yesterday.    Objective:  Vital signs in last 24 hours:  Temp:  [96.8 F (36 C)-98.7 F (37.1 C)] 98.7 F (37.1 C) (08/27 0742) Pulse Rate:  [43-92] 59 (08/27 0742) Resp:  [11-22] 16 (08/27 0742) BP: (91-140)/(61-104) 126/104 (08/27 0742) SpO2:  [90 %-100 %] 96 % (08/27 0742) Weight:  [98.5 kg-100.3 kg] 98.5 kg (08/26 2033)  Weight change:  Filed Weights   04/17/23 0906 04/17/23 1633 04/17/23 2033  Weight: 99 kg 100.3 kg 98.5 kg    Intake/Output: I/O last 3 completed shifts: In: 250 [IV Piggyback:250] Out: -    Intake/Output this shift:  No intake/output data recorded.  Physical Exam: General: NAD,   Head: Normocephalic, atraumatic. Moist oral mucosal membranes  Eyes: Anicteric  Lungs:  Clear to auscultation, normal effort  Heart: Regular rate and rhythm  Abdomen:  Soft, nontender,   Extremities:  Right LE ++ weeping edema, erythema and induration  Neurologic: Nonfocal, moving all four extremities  Skin: No lesions  Access: RIJ permcath    Basic Metabolic Panel: Recent Labs  Lab 04/17/23 0933  NA 138  K 3.8  CL 100  CO2 21*  GLUCOSE 70  BUN 44*  CREATININE 7.42*  CALCIUM 8.7*    Liver Function Tests: Recent Labs  Lab 04/17/23 0933  AST 117*  ALT 69*  ALKPHOS 146*  BILITOT 0.9  PROT 7.0  ALBUMIN 3.3*   No results for input(s): "LIPASE", "AMYLASE" in the last 168 hours.  No results for input(s): "AMMONIA" in the last 168 hours.  CBC: Recent Labs  Lab 04/17/23 0933 04/18/23 0535  WBC 4.5 5.8  NEUTROABS 3.9  --   HGB 13.2 12.6*  HCT 40.9 38.8*  MCV 101.5* 100.8*  PLT 153 148*    Cardiac  Enzymes: No results for input(s): "CKTOTAL", "CKMB", "CKMBINDEX", "TROPONINI" in the last 168 hours.  BNP: Invalid input(s): "POCBNP"  CBG: Recent Labs  Lab 04/17/23 2155 04/18/23 0836 04/18/23 1208  GLUCAP 108* 213* 189*    Microbiology: Results for orders placed or performed during the hospital encounter of 04/17/23  SARS Coronavirus 2 by RT PCR (hospital order, performed in [facility] hospital lab) *cepheid single result test* Anterior Nasal Swab     Status: None   Collection Time: 04/17/23 10:57 AM   Specimen: Anterior Nasal Swab  Result Value Ref Range Status   SARS Coronavirus 2 by RT PCR NEGATIVE NEGATIVE Final    Comment: (NOTE) SARS-CoV-2 target nucleic acids are NOT DETECTED.  The SARS-CoV-2 RNA is generally detectable in upper and lower respiratory specimens during the acute phase of infection. The lowest concentration of SARS-CoV-2 viral copies this assay can detect is 250 copies / mL. A negative result does not preclude SARS-CoV-2 infection and should not be used as the sole basis for treatment or other patient management decisions.  A negative result may occur with improper specimen collection / handling, submission of specimen other than nasopharyngeal swab, presence of viral mutation(s) within the areas targeted by this assay, and inadequate number of viral copies (<250 copies / mL). A negative result must be combined with clinical observations, patient history, and epidemiological information.  Fact  Sheet for Patients:   RoadLapTop.co.za  Fact Sheet for Healthcare Providers: http://kim-miller.com/  This test is not yet approved or  cleared by the [place] FDA and has been authorized for detection and/or diagnosis of SARS-CoV-2 by FDA under an Emergency Use Authorization (EUA).  This EUA will remain in effect (meaning this test can be used) for the duration of the COVID-19 declaration under Section  564(b)(1) of the Act, 21 U.S.C. section 360bbb-3(b)(1), unless the authorization is terminated or revoked sooner.  Performed at [facility], [address] [place], [place] [address]   Blood culture (routine x 2)     Status: None (Preliminary result)   Collection Time: 04/17/23 10:57 AM   Specimen: BLOOD  Result Value Ref Range Status   Specimen Description BLOOD LARM  Final   Special Requests   Final    BOTTLES DRAWN AEROBIC AND ANAEROBIC Blood Culture results may not be optimal due to an inadequate volume of blood received in culture bottles   Culture   Final    NO GROWTH < 24 HOURS Performed at [facility], [address] [place], [place] [address]    Report Status PENDING  Incomplete  Blood culture (routine x 2)     Status: None (Preliminary result)   Collection Time: 04/17/23 10:57 AM   Specimen: BLOOD  Result Value Ref Range Status   Specimen Description BLOOD RARM  Final   Special Requests   Final    BOTTLES DRAWN AEROBIC AND ANAEROBIC Blood Culture adequate volume   Culture   Final    NO GROWTH < 24 HOURS Performed at [facility], [address], [place], [place] [address]    Report Status PENDING  Incomplete    Coagulation Studies: No results for input(s): "LABPROT", "INR" in the last 72 hours.  Urinalysis: No results for input(s): "COLORURINE", "LABSPEC", "PHURINE", "GLUCOSEU", "HGBUR", "BILIRUBINUR", "KETONESUR", "PROTEINUR", "UROBILINOGEN", "NITRITE", "LEUKOCYTESUR" in the last 72 hours.  Invalid input(s): "APPERANCEUR"    Imaging: DG Tibia/Fibula Right  Result Date: 04/17/2023 CLINICAL DATA:  Shortness of breath.  Leg pain EXAM: RIGHT TIBIA AND FIBULA - 2 VIEW COMPARISON:  11/27/2021 FINDINGS: Prior ORIF of the proximal right tibia and distal femur. There is no evidence of acute fracture or other focal bone lesions. Healed fracture deformity of the proximal fibula. Generalized soft tissue swelling. Atherosclerotic vascular  calcifications. IMPRESSION: Generalized soft tissue swelling.  No acute findings. Electronically Signed   By: Duanne Guess D.O.   On: 04/17/2023 13:41   DG Chest Portable 1 View  Result Date: 04/17/2023 CLINICAL DATA:  Shortness of breath.  Evaluate for edema. EXAM: PORTABLE CHEST 1 VIEW COMPARISON:  03/20/2023 FINDINGS: There is a right chest wall dialysis catheter with tips in the right atrium. Previous median sternotomy and CABG procedure. Heart size and mediastinal contours appear normal. Lung volumes are low. Scar versus platelike atelectasis noted in the left base. No pleural effusion, interstitial edema or airspace disease. IMPRESSION: 1. No signs of interstitial edema or pleural effusion. 2. Decreased lung volumes with bibasilar scar versus platelike atelectasis. 3. Low lung volumes. Electronically Signed   By: Signa Kell M.D.   On: 04/17/2023 13:13   US Venous Img Lower Unilateral Right  Result Date: 04/17/2023 CLINICAL DATA:  Right lower extremity edema. EXAM: RIGHT LOWER EXTREMITY VENOUS DOPPLER ULTRASOUND TECHNIQUE: Gray-scale sonography with graded compression, as well as color Doppler and duplex ultrasound were performed to evaluate the lower extremity deep venous systems from the level  of the common femoral vein and including the common femoral, femoral, profunda femoral, popliteal and calf veins including the posterior tibial, peroneal and gastrocnemius veins when visible. The superficial great saphenous vein was also interrogated. Spectral Doppler was utilized to evaluate flow at rest and with distal augmentation maneuvers in the common femoral, femoral and popliteal veins. COMPARISON:  None Available. FINDINGS: Contralateral Common Femoral Vein: Respiratory phasicity is normal and symmetric with the symptomatic side. No evidence of thrombus. Normal compressibility. Common Femoral Vein: No evidence of thrombus. Normal compressibility, respiratory phasicity and response to  augmentation. Saphenofemoral Junction: No evidence of thrombus. Normal compressibility and flow on color Doppler imaging. Profunda Femoral Vein: No evidence of thrombus. Normal compressibility and flow on color Doppler imaging. Femoral Vein: No evidence of thrombus. Normal compressibility, respiratory phasicity and response to augmentation. Popliteal Vein: No evidence of thrombus. Normal compressibility, respiratory phasicity and response to augmentation. Calf Veins: No evidence of thrombus. Normal compressibility and flow on color Doppler imaging. Superficial Great Saphenous Vein: No evidence of thrombus. Normal compressibility. Venous Reflux:  None. Other Findings: No evidence of superficial thrombophlebitis or abnormal fluid collection. There is a single visualized right inguinal lymph node measuring roughly 3.0 x 1.9 x 2.0 cm that demonstrates atypical morphology without a visualized fatty hilum. This is nonspecific and may still potentially represent a reactive lymph node. Lymph node pathology cannot be excluded based on sonographic appearance. IMPRESSION: 1. No evidence of right lower extremity deep venous thrombosis. 2. Single visualized right inguinal lymph node measuring roughly 2 cm in short axis. This is nonspecific and may represent a reactive lymph node. Lymph node pathology cannot be excluded based on atypical sonographic appearance. Electronically Signed   By: Irish Lack M.D.   On: 04/17/2023 12:11     Medications:    cefTRIAXone (ROCEPHIN)  IV 2 g (04/18/23 1149)   [START ON 04/19/2023] vancomycin      apixaban  2.5 mg Oral BID   atorvastatin  80 mg Oral QHS   bisacodyl  10 mg Oral Daily   calcium carbonate  500 mg Oral BID   Chlorhexidine Gluconate Cloth  6 each Topical Q0600   folic acid  1 mg Oral Daily   gabapentin  100 mg Oral QHS   insulin aspart  0-6 Units Subcutaneous TID WC   melatonin  5 mg Oral QHS   pantoprazole  40 mg Oral Q12H   polyethylene glycol  17 g Oral QHS    predniSONE  10 mg Oral Q breakfast   senna-docusate  2 tablet Oral BID   simethicone  80 mg Oral BID   sodium chloride flush  3 mL Intravenous Q12H   tamsulosin  0.4 mg Oral QHS   torsemide  60 mg Oral Daily   acetaminophen **OR** acetaminophen, HYDROmorphone (DILAUDID) injection, ondansetron **OR** ondansetron (ZOFRAN) IV, oxyCODONE, polyethylene glycol  Assessment/ Plan:  [name] is a 67 y.o.  male with end stage renal disease on hemodialysis, hypertension, diabetes mellitus type II, hyperlipidemia, atrial fibrillation, coronary artery disease status post CABG, peptic ulcer disease and prostate cancer who is admitted to [facility] on 04/17/2023 for Right leg pain [M79.604] Cellulitis of right leg [L03.115] Cellulitis of right lower extremity [L03.115]  [facility] MWF  End Stage Renal Disease: requiring four days a week due to volume control - Received dialysis yesterday, UF 2L achieved -Next treatment scheduled for Wednesday  Hypertension with chronic kidney disease: hypotensive on encounter.  - restarted tamsulosin - holding home carvedilol,  torsemide, and amlodipine.   Anemia of chronic kidney disease: hemoglobin at goal. 12.6. No need for ESA.   Secondary Hyperparathyroidism: not currently phosphate binders.    LOS: 0   8/27/202412:39 PM

## 2023-04-18 NOTE — Progress Notes (Signed)
PROGRESS NOTE    Micheal Aguirre   ZOX:[id number] DOB: June 03, 1956  DOA: 04/17/2023 Date of Service: 04/18/23 PCP: Almetta Lovely, [facility]     Brief Narrative / Hospital Course:   Micheal Aguirre is a 67 y.o. 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 Mr. Nicklin states that over the last couple days, he has been having weeping from the inside of his right lower leg. Then today, he woke up with severe pain and swelling of his entire right lower extremity.  08/26: to ED, started on cefepime, Flagyl and vancomycin. DVT neg. Was admitted for R leg cellulitis      Consultants:  Nephrology   Procedures: none      ASSESSMENT & PLAN:   Principal Problem:   Cellulitis of right leg Active Problems:   ESRD on dialysis (HCC)   Prostate cancer (HCC)   Chronic diastolic CHF (congestive heart failure) (HCC)   HTN (hypertension)   Elevated LFTs   Type II diabetes mellitus with renal manifestations (HCC)   Atrial fibrillation, chronic (HCC)   Cellulitis of right leg Primary differential at this point is atypical cellulitis given erythema is quite subtle.  Differential also includes early stages of calciphylaxis.   No evidence of sepsis at this time; patient's elevated lactic acid is likely due to renal and hepatic dysfunction. Rocephin, Flagyl and vancomycin blood cultures pending tylenol as needed for fever Dilaudid as needed for pain control Elevate leg as much as possible  ESRD on dialysis [facility]) Dialysis management per nephrology  Prostate cancer [facility]) metastasis to lymph nodes and bone, currently suspected to have a recurrence  given increase in PSA and evidence of new hepatic lesions. Continue outpatient follow-up with oncology  Chronic diastolic CHF (congestive heart failure) (HCC) Patient appears relatively euvolemic at this time with the exception of right leg swelling, which is likely due to infection. Continue home regimen Daily weights Dialysis   HTN (hypertension) Patient has a history of hypertension, with somewhat labile blood pressure per previous chart review.  Currently, blood pressure is on the lower end of what it typically is. hold home antihypertensives  Elevated LFTs Patient was recently seen in the ED for generalized abdominal pain, found to have multiple new lesions throughout the liver concerning for recurrence of prostate cancer.  Elevated LFTs are likely due to this. Stable at this time.  Follow CMP  Type II diabetes mellitus with renal manifestations (HCC) SSI, very sensitive  Atrial fibrillation, chronic (HCC) Continue home low-dose Eliquis    DVT prophylaxis: Eliquis Pertinent IV fluids/nutrition: no continuous IV fluids  Central lines / invasive devices: none   Code Status: FULL CODE reviewed on admission see H&P ACP documentation reviewed: none on file   Current Admission Status: obs (?-->inpt?)  TOC needs / Dispo plan: TBD, expect back to his facility (?AL/SNF LT?) Barriers to discharge / significant pending items: blood cultures, improvement in cellulitis / pain              Subjective / Brief ROS:  Patient reports pain in leg but otherwise no concerns Denies CP/SOB.  Pain controlled except w/ touching.  Denies new weakness.  Tolerating diet.  Reports  no concerns w/ urination/defecation.   Family Communication: none at this time     Objective Findings:  Vitals:   04/17/23 2033 04/17/23 2111 04/18/23 0330 04/18/23 0742  BP:  104/68 (!) 140/92 (!) 126/104  Pulse:  90 79 (!) 59  Resp:  20 18 16   Temp:  97.8 F (36.6 C) 98.1 F (36.7 C) 98.7 F  (37.1 C)  TempSrc:  Oral Oral Oral  SpO2:  97% 100% 96%  Weight: 98.5 kg     Height:       No intake or output data in the 24 hours ending 04/18/23 1314  Filed Weights   04/17/23 0906 04/17/23 1633 04/17/23 2033  Weight: 99 kg 100.3 kg 98.5 kg    Examination:  Physical Exam Constitutional:      General: He is not in acute distress. Cardiovascular:     Rate and Rhythm: Normal rate and regular rhythm.  Pulmonary:     Effort: Pulmonary effort is normal.     Breath sounds: Normal breath sounds.  Musculoskeletal:     Right lower leg: Edema present.     Left lower leg: Edema present.  Skin:    Comments: Skin RLE c/w photos from admission, very tender, erythematous, weeping   Neurological:     Mental Status: He is alert.  Psychiatric:        Mood and Affect: Mood normal.        Behavior: Behavior normal.          Scheduled Medications:   apixaban  2.5 mg Oral BID   atorvastatin  80 mg Oral QHS   bisacodyl  10 mg Oral Daily   calcium carbonate  500 mg Oral BID   Chlorhexidine Gluconate Cloth  6 each Topical Q0600   folic acid  1 mg Oral Daily   gabapentin  100 mg Oral QHS   insulin aspart  0-6 Units Subcutaneous TID WC   melatonin  5 mg Oral QHS   pantoprazole  40 mg Oral Q12H   polyethylene glycol  17 g Oral QHS   predniSONE  10 mg Oral Q breakfast   senna-docusate  2 tablet Oral BID   simethicone  80 mg Oral BID   sodium chloride flush  3 mL Intravenous Q12H   tamsulosin  0.4 mg Oral QHS   torsemide  60 mg Oral Daily    Continuous Infusions:  cefTRIAXone (ROCEPHIN)  IV 2 g (04/18/23 1149)   [START ON 04/19/2023] vancomycin      PRN Medications:  acetaminophen **OR** acetaminophen, HYDROmorphone (DILAUDID) injection, ondansetron **OR** ondansetron (ZOFRAN) IV, oxyCODONE, polyethylene glycol  Antimicrobials from admission:  Anti-infectives (From admission, onward)    Start     Dose/Rate Route Frequency Ordered Stop   04/19/23 1200  vancomycin (VANCOCIN)  IVPB 1000 mg/200 mL premix        1,000 mg 200 mL/hr over 60 Minutes Intravenous Every M-W-F (Hemodialysis) 04/18/23 1216     04/18/23 1200  cefTRIAXone (ROCEPHIN) 2 g in sodium chloride 0.9 % 100 mL IVPB        2 g 200 mL/hr over 30 Minutes Intravenous Every 24 hours 04/17/23 1347 04/25/23 1159   04/17/23 2200  metroNIDAZOLE (FLAGYL) IVPB 500 mg  Status:  Discontinued        500 mg 100 mL/hr over 60 Minutes Intravenous Every 8 hours 04/17/23 1347 04/17/23 1348   04/17/23 1900  vancomycin (VANCOCIN) IVPB 1000 mg/200 mL premix  1,000 mg 200 mL/hr over 60 Minutes Intravenous  Once 04/17/23 1652 04/17/23 2047   04/17/23 1245  vancomycin (VANCOREADY) IVPB 2000 mg/400 mL        2,000 mg 200 mL/hr over 120 Minutes Intravenous  Once 04/17/23 1234 04/17/23 2048   04/17/23 1230  ceFEPIme (MAXIPIME) 2 g in sodium chloride 0.9 % 100 mL IVPB        2 g 200 mL/hr over 30 Minutes Intravenous  Once 04/17/23 1217 04/17/23 1315   04/17/23 1230  metroNIDAZOLE (FLAGYL) IVPB 500 mg        500 mg 100 mL/hr over 60 Minutes Intravenous  Once 04/17/23 1217 04/17/23 1441   04/17/23 1230  vancomycin (VANCOCIN) IVPB 1000 mg/200 mL premix  Status:  Discontinued        1,000 mg 200 mL/hr over 60 Minutes Intravenous  Once 04/17/23 1217 04/17/23 1234           Data Reviewed:  I have personally reviewed the following...  CBC: Recent Labs  Lab 04/17/23 0933 04/18/23 0535  WBC 4.5 5.8  NEUTROABS 3.9  --   HGB 13.2 12.6*  HCT 40.9 38.8*  MCV 101.5* 100.8*  PLT 153 148*   Basic Metabolic Panel: Recent Labs  Lab 04/17/23 0933  NA 138  K 3.8  CL 100  CO2 21*  GLUCOSE 70  BUN 44*  CREATININE 7.42*  CALCIUM 8.7*   GFR: Estimated Creatinine Clearance: 11.2 mL/min (A) (by C-G formula based on SCr of 7.42 mg/dL (H)). Liver Function Tests: Recent Labs  Lab 04/17/23 0933  AST 117*  ALT 69*  ALKPHOS 146*  BILITOT 0.9  PROT 7.0  ALBUMIN 3.3*   No results for input(s): "LIPASE",  "AMYLASE" in the last 168 hours.  No results for input(s): "AMMONIA" in the last 168 hours. Coagulation Profile: No results for input(s): "INR", "PROTIME" in the last 168 hours. Cardiac Enzymes: No results for input(s): "CKTOTAL", "CKMB", "CKMBINDEX", "TROPONINI" in the last 168 hours. BNP (last 3 results) No results for input(s): "PROBNP" in the last 8760 hours. HbA1C: No results for input(s): "HGBA1C" in the last 72 hours. CBG: Recent Labs  Lab 04/17/23 2155 04/18/23 0836 04/18/23 1208  GLUCAP 108* 213* 189*   Lipid Profile: No results for input(s): "CHOL", "HDL", "LDLCALC", "TRIG", "CHOLHDL", "LDLDIRECT" in the last 72 hours. Thyroid Function Tests: No results for input(s): "TSH", "T4TOTAL", "FREET4", "T3FREE", "THYROIDAB" in the last 72 hours. Anemia Panel: No results for input(s): "VITAMINB12", "FOLATE", "FERRITIN", "TIBC", "IRON", "RETICCTPCT" in the last 72 hours. Most Recent Urinalysis On File:     Component Value Date/Time   COLORURINE YELLOW (A) 06/12/2022 1423   APPEARANCEUR CLEAR (A) 06/12/2022 1423   LABSPEC 1.009 06/12/2022 1423   PHURINE 6.0 06/12/2022 1423   GLUCOSEU NEGATIVE 06/12/2022 1423   HGBUR NEGATIVE 06/12/2022 1423   BILIRUBINUR NEGATIVE 06/12/2022 1423   KETONESUR NEGATIVE 06/12/2022 1423   PROTEINUR 100 (A) 06/12/2022 1423   NITRITE NEGATIVE 06/12/2022 1423   LEUKOCYTESUR NEGATIVE 06/12/2022 1423   Sepsis Labs: @LABRCNTIP (procalcitonin:4,lacticidven:4) Microbiology: Recent Results (from the past 240 hour(s))  SARS Coronavirus 2 by RT PCR (hospital order, performed in [facility] hospital lab) *cepheid single result test* Anterior Nasal Swab     Status: None   Collection Time: 04/17/23 10:57 AM   Specimen: Anterior Nasal Swab  Result Value Ref Range Status   SARS Coronavirus 2 by RT PCR NEGATIVE NEGATIVE Final    Comment: (NOTE) SARS-CoV-2 target nucleic acids are NOT DETECTED.  The SARS-CoV-2 RNA is generally detectable in upper and  lower respiratory specimens during the acute phase of infection. The lowest concentration of SARS-CoV-2 viral copies this assay can detect is 250 copies / mL. A negative result does not preclude SARS-CoV-2 infection and should not be used as the sole basis for treatment or other patient management decisions.  A negative result may occur with improper specimen collection / handling, submission of specimen other than nasopharyngeal swab, presence of viral mutation(s) within the areas targeted by this assay, and inadequate number of viral copies (<250 copies / mL). A negative result must be combined with clinical observations, patient history, and epidemiological information.  Fact Sheet for Patients:   RoadLapTop.co.za  Fact Sheet for Healthcare Providers: http://kim-miller.com/  This test is not yet approved or  cleared by the [place] FDA and has been authorized for detection and/or diagnosis of SARS-CoV-2 by FDA under an Emergency Use Authorization (EUA).  This EUA will remain in effect (meaning this test can be used) for the duration of the COVID-19 declaration under Section 564(b)(1) of the Act, 21 U.S.C. section 360bbb-3(b)(1), unless the authorization is terminated or revoked sooner.  Performed at [facility], [address] [place], [place] [address]   Blood culture (routine x 2)     Status: None (Preliminary result)   Collection Time: 04/17/23 10:57 AM   Specimen: BLOOD  Result Value Ref Range Status   Specimen Description BLOOD LARM  Final   Special Requests   Final    BOTTLES DRAWN AEROBIC AND ANAEROBIC Blood Culture results may not be optimal due to an inadequate volume of blood received in culture bottles   Culture   Final    NO GROWTH < 24 HOURS Performed at [facility], [address], [place], [place] [address]    Report Status PENDING  Incomplete  Blood culture (routine x 2)     Status:  None (Preliminary result)   Collection Time: 04/17/23 10:57 AM   Specimen: BLOOD  Result Value Ref Range Status   Specimen Description BLOOD RARM  Final   Special Requests   Final    BOTTLES DRAWN AEROBIC AND ANAEROBIC Blood Culture adequate volume   Culture   Final    NO GROWTH < 24 HOURS Performed at [facility], [address], [place], [place] [address]    Report Status PENDING  Incomplete      Radiology Studies last 3 days: DG Tibia/Fibula Right  Result Date: 04/17/2023 CLINICAL DATA:  Shortness of breath.  Leg pain EXAM: RIGHT TIBIA AND FIBULA - 2 VIEW COMPARISON:  11/27/2021 FINDINGS: Prior ORIF of the proximal right tibia and distal femur. There is no evidence of acute fracture or other focal bone lesions. Healed fracture deformity of the proximal fibula. Generalized soft tissue swelling. Atherosclerotic vascular calcifications. IMPRESSION: Generalized soft tissue swelling.  No acute findings. Electronically Signed   By: Duanne Guess D.O.   On: 04/17/2023 13:41   DG Chest Portable 1 View  Result Date: 04/17/2023 CLINICAL DATA:  Shortness of breath.  Evaluate for edema. EXAM: PORTABLE CHEST 1 VIEW COMPARISON:  03/20/2023 FINDINGS: There is a right chest wall dialysis catheter with tips in the right atrium. Previous median sternotomy and CABG procedure. Heart size and mediastinal contours appear normal. Lung volumes are low. Scar versus platelike atelectasis noted in the left base. No pleural effusion, interstitial edema or airspace disease. IMPRESSION: 1. No signs of interstitial edema or pleural effusion. 2. Decreased lung  volumes with bibasilar scar versus platelike atelectasis. 3. Low lung volumes. Electronically Signed   By: Signa Kell M.D.   On: 04/17/2023 13:13   US Venous Img Lower Unilateral Right  Result Date: 04/17/2023 CLINICAL DATA:  Right lower extremity edema. EXAM: RIGHT LOWER EXTREMITY VENOUS DOPPLER ULTRASOUND TECHNIQUE: Gray-scale sonography with  graded compression, as well as color Doppler and duplex ultrasound were performed to evaluate the lower extremity deep venous systems from the level of the common femoral vein and including the common femoral, femoral, profunda femoral, popliteal and calf veins including the posterior tibial, peroneal and gastrocnemius veins when visible. The superficial great saphenous vein was also interrogated. Spectral Doppler was utilized to evaluate flow at rest and with distal augmentation maneuvers in the common femoral, femoral and popliteal veins. COMPARISON:  None Available. FINDINGS: Contralateral Common Femoral Vein: Respiratory phasicity is normal and symmetric with the symptomatic side. No evidence of thrombus. Normal compressibility. Common Femoral Vein: No evidence of thrombus. Normal compressibility, respiratory phasicity and response to augmentation. Saphenofemoral Junction: No evidence of thrombus. Normal compressibility and flow on color Doppler imaging. Profunda Femoral Vein: No evidence of thrombus. Normal compressibility and flow on color Doppler imaging. Femoral Vein: No evidence of thrombus. Normal compressibility, respiratory phasicity and response to augmentation. Popliteal Vein: No evidence of thrombus. Normal compressibility, respiratory phasicity and response to augmentation. Calf Veins: No evidence of thrombus. Normal compressibility and flow on color Doppler imaging. Superficial Great Saphenous Vein: No evidence of thrombus. Normal compressibility. Venous Reflux:  None. Other Findings: No evidence of superficial thrombophlebitis or abnormal fluid collection. There is a single visualized right inguinal lymph node measuring roughly 3.0 x 1.9 x 2.0 cm that demonstrates atypical morphology without a visualized fatty hilum. This is nonspecific and may still potentially represent a reactive lymph node. Lymph node pathology cannot be excluded based on sonographic appearance. IMPRESSION: 1. No evidence of  right lower extremity deep venous thrombosis. 2. Single visualized right inguinal lymph node measuring roughly 2 cm in short axis. This is nonspecific and may represent a reactive lymph node. Lymph node pathology cannot be excluded based on atypical sonographic appearance. Electronically Signed   By: Irish Lack M.D.   On: 04/17/2023 12:11             LOS: 0 days     Sunnie Nielsen, DO [facility] 04/18/2023, 1:14 PM    Dictation software may have been used to generate the above note. Typos may occur and escape review in typed/dictated notes. Please contact Dr Lyn Hollingshead directly for clarity if needed.  Staff may message me via secure chat in Epic  but this may not receive an immediate response,  please page me for urgent matters!  If 7PM-7AM, please contact night coverage [email or web address]

## 2023-04-19 DIAGNOSIS — C61 Malignant neoplasm of prostate: Secondary | ICD-10-CM | POA: Diagnosis not present

## 2023-04-19 DIAGNOSIS — I5032 Chronic diastolic (congestive) heart failure: Secondary | ICD-10-CM | POA: Diagnosis not present

## 2023-04-19 DIAGNOSIS — L03115 Cellulitis of right lower limb: Secondary | ICD-10-CM | POA: Diagnosis not present

## 2023-04-19 DIAGNOSIS — N186 End stage renal disease: Secondary | ICD-10-CM | POA: Diagnosis not present

## 2023-04-19 LAB — RENAL FUNCTION PANEL
Albumin: 2.7 g/dL — ABNORMAL LOW (ref 3.5–5.0)
Anion gap: 14 (ref 5–15)
BUN: 53 mg/dL — ABNORMAL HIGH (ref 8–23)
CO2: 20 mmol/L — ABNORMAL LOW (ref 22–32)
Calcium: 8.3 mg/dL — ABNORMAL LOW (ref 8.9–10.3)
Chloride: 101 mmol/L (ref 98–111)
Creatinine, Ser: 6.76 mg/dL — ABNORMAL HIGH (ref 0.61–1.24)
GFR, Estimated: 8 mL/min — ABNORMAL LOW (ref >60)
Glucose, Bld: 158 mg/dL — ABNORMAL HIGH (ref 70–99)
Phosphorus: 2.9 mg/dL (ref 2.5–4.6)
Potassium: 3.7 mmol/L (ref 3.5–5.1)
Sodium: 135 mmol/L (ref 135–145)

## 2023-04-19 LAB — CBC
HCT: 36.8 % — ABNORMAL LOW (ref 39.0–52.0)
Hemoglobin: 12.1 g/dL — ABNORMAL LOW (ref 13.0–17.0)
MCH: 32.6 pg (ref 26.0–34.0)
MCHC: 32.9 g/dL (ref 30.0–36.0)
MCV: 99.2 fL (ref 80.0–100.0)
Platelets: 126 10*3/uL — ABNORMAL LOW (ref 150–400)
RBC: 3.71 MIL/uL — ABNORMAL LOW (ref 4.22–5.81)
RDW: 15.7 % — ABNORMAL HIGH (ref 11.5–15.5)
WBC: 7.4 10*3/uL (ref 4.0–10.5)
nRBC: 1.5 % — ABNORMAL HIGH (ref 0.0–0.2)

## 2023-04-19 LAB — GLUCOSE, CAPILLARY
Glucose-Capillary: 132 mg/dL — ABNORMAL HIGH (ref 70–99)
Glucose-Capillary: 115 mg/dL — ABNORMAL HIGH (ref 70–99)
Glucose-Capillary: 139 mg/dL — ABNORMAL HIGH (ref 70–99)

## 2023-04-19 LAB — HEPATITIS B SURFACE ANTIBODY, QUANTITATIVE: Hep B S AB Quant (Post): 127 m[IU]/mL

## 2023-04-19 MED ORDER — HEPARIN SODIUM (PORCINE) 1000 UNIT/ML DIALYSIS: 1000.0000 [IU] | INTRAMUSCULAR | Status: DC | PRN

## 2023-04-19 MED ORDER — ALTEPLASE 2 MG IJ SOLR: 2.0000 mg | Freq: Once | INTRAMUSCULAR | Status: DC | PRN

## 2023-04-19 NOTE — Evaluation (Signed)
Physical Therapy Evaluation Patient Details Name: Micheal Aguirre MRN: [id number] DOB: 1956-02-24 Today's Date: 04/19/2023  History of Present Illness  67 y.o. 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 He states that over the previous couple days he has been having weeping from the inside of his right lower leg evolving to severe pain and swelling of his entire right lower extremity.  Clinical Impression  Pt pleasant and initially willing to try some mobility but became increasingly hesitant and guarded with even a minimal amount of P/AAROM R LE movement.  Plan was to try standing, but pt struggled to even tolerate getting toward EOB, much less the gravity dependent position of the R LE.  Needed heavy assist to get R LE back up into bed and overall showed little functional activity tolerance. Pt will need PT to address functional issues.        If plan is discharge home, recommend the following: Two people to help with walking and/or transfers;A little help with bathing/dressing/bathroom;Assistance with cooking/housework;Assist for transportation;Supervision due to cognitive status;Help with stairs or ramp for entrance   Can travel by private vehicle   No    Equipment Recommendations  (TBD at next venue)  Recommendations for Other Services       Functional Status Assessment Patient has had a recent decline in their functional status and demonstrates the ability to make significant improvements in function in a reasonable and predictable amount of time.     Precautions / Restrictions Precautions Precautions: Fall Restrictions Weight  Bearing Restrictions: No      Mobility  Bed Mobility Overal bed mobility: Needs Assistance Bed Mobility: Supine to Sit, Sit to Supine     Supine to sit: Mod assist Sit to supine: Mod assist   General bed mobility comments: Pt veyr guarded and hesistant with transition to sitting EOB, needed direct assist with R LE movement and though he was able to control torso his aversion to any movement/touch/etc to R LE translated to very poor tolerance, quickly needing assist with lift R LE back up into bed w/o much control    Transfers                   General transfer comment: pt deferred too uncomfortable with R LE in gravity dependent position to tolerate/attempt    Ambulation/Gait                  Stairs            Wheelchair Mobility     Tilt Bed    Modified Rankin (Stroke Patients Only)       Balance Overall balance assessment: Needs assistance Sitting-balance support: Single extremity supported Sitting balance-Leahy Scale: Fair Sitting balance - Comments: able to maintain long sitting in bed, however at EOB very guarded &hesistant re: R LE, unable to tolerate and leaning away to get pressure off R LE                                     Pertinent Vitals/Pain Pain Assessment Pain Assessment: 0-10 Pain Score: 8  (  reports minimal pain at rest but severe pain with even light palpation/movement of R LE) Pain Location: R leg distal to knee    Home Living Family/patient expects to be discharged to:: Unsure                   Additional Comments: resident at [facility]    Prior Function Prior Level of Function : Other (comment)             Mobility Comments: baseline confusion but he reports that normally he is able to do some limited ambulation with walker but recently more w/c bound       Extremity/Trunk Assessment   Upper Extremity Assessment Upper Extremity Assessment: Overall WFL for tasks assessed    Lower  Extremity Assessment Lower Extremity Assessment: Generalized weakness (L LE grossly 4/5, R LE very swollen, sensative and needing AA/PROM for all movement)       Communication   Communication Communication: No apparent difficulties  Cognition Arousal: Alert Behavior During Therapy: Anxious Overall Cognitive Status: History of cognitive impairments - at baseline                                 General Comments: unsure of true baseline, but h/o cognitive defecits        General Comments General comments (skin integrity, edema, etc.): Pt initially willing to try some activity but quickly became very guarded as soon as some R LE movement began to happen    Exercises     Assessment/Plan    PT Assessment Patient needs continued PT services  PT Problem List Decreased strength;Decreased range of motion;Decreased activity tolerance;Decreased balance;Decreased mobility;Decreased cognition;Decreased knowledge of use of DME;Decreased safety awareness;Pain       PT Treatment Interventions DME instruction;Gait training;Functional mobility training;Therapeutic activities;Therapeutic exercise;Balance training;Patient/family education    PT Goals (Current goals can be found in the Care Plan section)  Acute Rehab PT Goals Patient Stated Goal: control R LE pain PT Goal Formulation: With patient Time For Goal Achievement: 05/02/23 Potential to Achieve Goals: Fair    Frequency Min 1X/week     Co-evaluation               AM-PAC PT "6 Clicks" Mobility  Outcome Measure Help needed turning from your back to your side while in a flat bed without using bedrails?: A Little Help needed moving from lying on your back to sitting on the side of a flat bed without using bedrails?: A Lot Help needed moving to and from a bed to a chair (including a wheelchair)?: Total Help needed standing up from a chair using your arms (e.g., wheelchair or bedside chair)?: Total Help needed to  walk in hospital room?: Total Help needed climbing 3-5 steps with a railing? : Total 6 Click Score: 9    End of Session   Activity Tolerance: Patient limited by pain Patient left: with call bell/phone within reach;with bed alarm set Nurse Communication: Mobility status (R LE weeping) PT Visit Diagnosis: Muscle weakness (generalized) (M62.81);Difficulty in walking, not elsewhere classified (R26.2);Pain Pain - Right/Left: Right Pain - part of body: Leg    Time: 1610-9604 PT Time Calculation (min) (ACUTE ONLY): 29 min   Charges:   PT Evaluation $PT Eval Low Complexity: 1 Low PT Treatments $Therapeutic Activity: 8-22 mins PT General Charges $$ ACUTE PT VISIT: 1 Visit         Malachi Pro, DPT  04/19/2023, 4:58 PM

## 2023-04-19 NOTE — Progress Notes (Signed)
Hemodialysis Note  Received patient in bed to unit. Alert and oriented. Informed consent signed and in chart.   Treatment initiated: 1001 Treatment completed:1352  Patient tolerated treatment well. Transported back to the room alert, without acute distress. Report given to patient's RN.  Access used:  Right Subclavian Catheter  Access issues: None   Total UF removed: 1400 ml  Medications given: Heparin  Post HD VS:  Stable  Post HD weight:  96.5 kg    Bartolo Darter, RN  [facility]

## 2023-04-19 NOTE — Consult Note (Signed)
WOC Nurse Consult Note: Reason for Consult: RLE Patient admitted for leg pain, history of right leg pain, previous orthopedic surgery 2023.  History of ESRD on HD, CABG, HTN Wound type: cellulitis Pressure Injury POA: NA Measurement:scattered area posterior right distal calf Wound bed: partial thickness skin loss, serous blistering, pink, moist, no real s/s of infection  Drainage (amount, consistency, odor) serous Periwound: edema  Dressing procedure/placement/frequency: Cleanse area with saline, pat dry Apply single layer of xeroform gauze to the affected areas. Top with dry dressing. Secure with kerlix. Wrap from toes to knees. Change daily.    FU in [facility] of patient's choice if chronic LE wounds continue.   Re consult if needed, will not follow at this time. Thanks  Antrell [employer]er [employer], RN,CWOCN, CNS, CWON-AP [phone])

## 2023-04-19 NOTE — Consult Note (Cosign Needed Addendum)
Pharmacy Antibiotic Note  Micheal Aguirre is a 67 y.o. male admitted on 04/17/2023 with  leg pain . 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 Patient being treated for atypical cellulitis with subtle erythema. No evidence of sepsis. Patient on dialysis MWF per [facility] nephrology. Dosing per session. Pharmacy has been consulted for Vancomycin dosing.  Plan:  Day 3 of antibiotics Patient receiving dialysis today, ordered 1g dose to be given at the end of the session Will continue to monitor dialysis schedule daily  If still on vancomycin, plan to take a level after the 4th dose  Height: 5\' 9"  (175.3 cm) Weight: 98.5 kg (217 lb 2.5 oz) IBW/kg (Calculated) : 70.7  Temp (24hrs), Avg:98.1 F (36.7 C), Min:98 F (36.7 C), Max:98.3 F (36.8 C)  Recent Labs  Lab 04/17/23 0933 04/17/23 1444 04/18/23 0535  WBC 4.5  --  5.8  CREATININE 7.42*  --   --   LATICACIDVEN 3.2* 2.9*  --     Estimated Creatinine Clearance: 11.2 mL/min (A) (by C-G formula based on SCr of 7.42 mg/dL (H)).    Allergies  Allergen Reactions   Hydralazine     Other reaction(s): Kidney Disorder, Other (See Comments) Concern for drug-induced AIN in 06/2021    Lisinopril Swelling and Other (See Comments)    Angioedema    Bimatoprost     Other reaction(s): Unknown   Empagliflozin Other (See Comments)    UTI Other reaction(s): uti     Antimicrobials this admission: Vancomycin 8/26 >>  Cefepime 8/26 x1  Dose adjustments this admission: N/A  Microbiology results: 8/26 BCx: NGTD  Thank you for allowing pharmacy to be a part of this patient's care.  Effie Shy 04/19/2023 8:59 AM

## 2023-04-19 NOTE — Progress Notes (Signed)
1      PROGRESS NOTE    Micheal Aguirre  LKG:[id number] DOB: 1955/09/17 DOA: 04/17/2023 PCP: Almetta Lovely, [facility]    Brief Narrative:    Micheal Aguirre is a 67 y.o. 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 Micheal Aguirre states that over the last couple days, he has been having weeping from the inside of his right lower leg. Then today, he woke up with severe pain and swelling of his entire right lower extremity.  08/26: to ED, started on cefepime, Flagyl and vancomycin. DVT neg. Was admitted for R leg cellulitis  8/28: PT eval -recommends SNF.  TOC working on it.  Patient is from [place] and will need evaluation by them before return    Assessment & Plan:   Principal Problem:   Cellulitis of right leg Active Problems:   ESRD on dialysis [facility])   Prostate cancer (HCC)   Chronic diastolic CHF (congestive heart failure) (HCC)   HTN (hypertension)   Elevated LFTs   Type II diabetes mellitus with renal manifestations (HCC)   Atrial fibrillation, chronic (HCC)  Cellulitis of right leg Primary differential at this point is atypical cellulitis given erythema is quite subtle.  Differential also includes early stages of calciphylaxis.   No evidence of sepsis at this time; patient's elevated lactic acid is likely due to renal and hepatic dysfunction. Continue IV Rocephin and vancomycin blood cultures neg thus far tylenol as needed for fever Oxycodone as needed for pain control Elevate leg as much as possible as edema is likely dependent    ESRD on dialysis [facility]) Dialysis management per nephrology   Prostate cancer [facility]) metastasis  to lymph nodes and bone, currently suspected to have a recurrence given increase in PSA and evidence of new hepatic lesions. Continue outpatient follow-up with oncology   Chronic diastolic CHF (congestive heart failure) (HCC) Patient appears relatively euvolemic at this time with the exception of right leg swelling, which is likely due to infection. Continue home regimen Daily weights Dialysis    HTN (hypertension) Patient has a history of hypertension, with somewhat labile blood pressure per previous chart review.  Currently, blood pressure is on the lower end of what it typically is. hold home antihypertensives   Elevated LFTs Patient was recently seen in the ED for generalized abdominal pain, found to have multiple new lesions throughout the liver concerning for recurrence of prostate cancer.  Elevated LFTs are likely due to this. Stable at this time.  Follow CMP   Type II diabetes mellitus with renal manifestations (HCC) SSI, very sensitive   Atrial fibrillation, chronic (HCC) Continue home low-dose Eliquis   DVT prophylaxis: Eliquis apixaban (ELIQUIS) tablet 2.5 mg Start: 04/17/23 2200 apixaban (ELIQUIS) tablet 2.5 mg     Code Status: Full code Family Communication: Updated Daughter over phone Disposition Plan: PT recommends SNF, patient is from [facility] and per [facility] - their rep may come to reassess and decide if he could go back   Consultants:  Nephro   Antimicrobials:  Rocephin Vanco    Subjective:  Has some weeping edema but erythema improved  Objective: Vitals:   04/19/23 1400 04/19/23 1417  04/19/23 1616 04/19/23 2002  BP: (!) 130/93  126/79 125/83  Pulse: (!) 101  (!) 102 97  Resp: 16  18 20   Temp:   98 F (36.7 C) 98.2 F (36.8 C)  TempSrc:    Oral  SpO2: 100%  100% 97%  Weight:  96.5 kg    Height:        Intake/Output Summary (Last 24 hours) at 04/19/2023 2158 Last data filed at 04/19/2023 1923 Gross per 24 hour  Intake  540 ml  Output 1407 ml  Net -867 ml   Filed Weights   04/17/23 2033 04/19/23 1000 04/19/23 1417  Weight: 98.5 kg 98 kg 96.5 kg    Examination:  General exam: Appears calm and comfortable  Respiratory system: Clear to auscultation. Respiratory effort normal. Cardiovascular system: S1 & S2 heard, RRR. No JVD, murmurs, rubs, gallops or clicks. No pedal edema. Gastrointestinal system: Abdomen is nondistended, soft and nontender. No organomegaly or masses felt. Normal bowel sounds heard. Central nervous system: Alert and oriented. No focal neurological deficits. Extremities: Symmetric 5 x 5 power. Skin: RLE has weeping edema, see picture from admission Psychiatry: Judgement and insight appear normal. Mood & affect appropriate.     Data Reviewed: I have personally reviewed following labs and imaging studies  CBC: Recent Labs  Lab 04/17/23 0933 04/18/23 0535 04/19/23 0907  WBC 4.5 5.8 7.4  NEUTROABS 3.9  --   --   HGB 13.2 12.6* 12.1*  HCT 40.9 38.8* 36.8*  MCV 101.5* 100.8* 99.2  PLT 153 148* 126*   Basic Metabolic Panel: Recent Labs  Lab 04/17/23 0933 04/19/23 0907  NA 138 135  K 3.8 3.7  CL 100 101  CO2 21* 20*  GLUCOSE 70 158*  BUN 44* 53*  CREATININE 7.42* 6.76*  CALCIUM 8.7* 8.3*  PHOS  --  2.9   GFR: Estimated Creatinine Clearance: 12.1 mL/min (A) (by C-G formula based on SCr of 6.76 mg/dL (H)). Liver Function Tests: Recent Labs  Lab 04/17/23 0933 04/19/23 0907  AST 117*  --   ALT 69*  --   ALKPHOS 146*  --   BILITOT 0.9  --   PROT 7.0  --   ALBUMIN 3.3* 2.7*    CBG: Recent Labs  Lab 04/18/23 1635 04/18/23 2121 04/19/23 0816 04/19/23 1615 04/19/23 2113  GLUCAP 209* 172* 132* 115* 139*    Sepsis Labs: Recent Labs  Lab 04/17/23 0933 04/17/23 1444  LATICACIDVEN 3.2* 2.9*    Recent Results (from the past 240 hour(s))  SARS Coronavirus 2 by RT PCR (hospital order, performed in [facility] hospital lab) *cepheid single result test*  Anterior Nasal Swab     Status: None   Collection Time: 04/17/23 10:57 AM   Specimen: Anterior Nasal Swab  Result Value Ref Range Status   SARS Coronavirus 2 by RT PCR NEGATIVE NEGATIVE Final    Comment: (NOTE) SARS-CoV-2 target nucleic acids are NOT DETECTED.  The SARS-CoV-2 RNA is generally detectable in upper and lower respiratory specimens during the acute phase of infection. The lowest concentration of SARS-CoV-2 viral copies this assay can detect is 250 copies / mL. A negative result does not preclude SARS-CoV-2 infection and should not be used as the sole basis for treatment or other patient management decisions.  A negative result may occur with improper specimen collection / handling, submission of specimen other than nasopharyngeal swab, presence of viral mutation(s) within the areas targeted by this assay, and inadequate number of viral copies (<250  copies / mL). A negative result must be combined with clinical observations, patient history, and epidemiological information.  Fact Sheet for Patients:   RoadLapTop.co.za  Fact Sheet for Healthcare Providers: http://kim-miller.com/  This test is not yet approved or  cleared by the [place] FDA and has been authorized for detection and/or diagnosis of SARS-CoV-2 by FDA under an Emergency Use Authorization (EUA).  This EUA will remain in effect (meaning this test can be used) for the duration of the COVID-19 declaration under Section 564(b)(1) of the Act, 21 U.S.C. section 360bbb-3(b)(1), unless the authorization is terminated or revoked sooner.  Performed at [facility], [address] [place], [place] [address]   Blood culture (routine x 2)     Status: None (Preliminary result)   Collection Time: 04/17/23 10:57 AM   Specimen: BLOOD  Result Value Ref Range Status   Specimen Description BLOOD LARM  Final   Special Requests   Final    BOTTLES DRAWN AEROBIC AND  ANAEROBIC Blood Culture results may not be optimal due to an inadequate volume of blood received in culture bottles   Culture   Final    NO GROWTH 2 DAYS Performed at [facility], [address], [place], [place] [address]    Report Status PENDING  Incomplete  Blood culture (routine x 2)     Status: None (Preliminary result)   Collection Time: 04/17/23 10:57 AM   Specimen: BLOOD  Result Value Ref Range Status   Specimen Description BLOOD RARM  Final   Special Requests   Final    BOTTLES DRAWN AEROBIC AND ANAEROBIC Blood Culture adequate volume   Culture   Final    NO GROWTH 2 DAYS Performed at [facility], [address] [place], [place] [address]    Report Status PENDING  Incomplete         Radiology Studies: No results found.      Scheduled Meds:  apixaban  2.5 mg Oral BID   atorvastatin  80 mg Oral QHS   bisacodyl  10 mg Oral Daily   calcium carbonate  500 mg Oral BID   Chlorhexidine Gluconate Cloth  6 each Topical Q0600   folic acid  1 mg Oral Daily   gabapentin  100 mg Oral QHS   insulin aspart  0-6 Units Subcutaneous TID WC   melatonin  5 mg Oral QHS   pantoprazole  40 mg Oral Q12H   polyethylene glycol  17 g Oral QHS   predniSONE  10 mg Oral Q breakfast   senna-docusate  2 tablet Oral BID   simethicone  80 mg Oral BID   sodium chloride flush  3 mL Intravenous Q12H   tamsulosin  0.4 mg Oral QHS   torsemide  60 mg Oral Daily   Continuous Infusions:  cefTRIAXone (ROCEPHIN)  IV 2 g (04/19/23 1506)   vancomycin 1,000 mg (04/19/23 1555)     LOS: 1 day    Time spent: 35 mins    Kenshin Splawn Sherryll Burger, MD [facility] Pager [phone]  If 7PM-7AM, please contact night-coverage [email or web address]  04/19/2023, 9:58 PM

## 2023-04-19 NOTE — Progress Notes (Signed)
OT Cancellation Note  Patient Details Name: Caelen Nyborg MRN: [id number] DOB: 1956-02-17   Cancelled Treatment:    Reason Eval/Treat Not Completed: Other (comment) (pt is off the floor for HD, OT will reattempt as able) Oleta Mouse, OTD OTR/L  04/19/23, 12:42 PM

## 2023-04-19 NOTE — Progress Notes (Addendum)
[facility]  ROUNDING NOTE   Subjective:   [name] was admitted to [facility] on 04/17/2023 for Right leg pain [M79.604] Cellulitis of right leg [L03.115] Cellulitis of right lower extremity [L03.115]  Patient seen and evaluated during dialysis   HEMODIALYSIS FLOWSHEET:  Blood Flow Rate (mL/min): 400 mL/min Arterial Pressure (mmHg): -221.03 mmHg Venous Pressure (mmHg): 218.16 mmHg TMP (mmHg): 4.64 mmHg Ultrafiltration Rate (mL/min): 829 mL/min Dialysate Flow Rate (mL/min): 300 ml/min  Tolerating treatment well Afebrile  Objective:  Vital signs in last 24 hours:  Temp:  [97.6 F (36.4 C)-98.3 F (36.8 C)] 97.6 F (36.4 C) (08/28 1000) Pulse Rate:  [62-96] 62 (08/28 1130) Resp:  [8-19] 17 (08/28 1130) BP: (93-140)/(73-79) 96/78 (08/28 1130) SpO2:  [95 %-100 %] 96 % (08/28 1130) Weight:  [98 kg] 98 kg (08/28 1000)  Weight change:  Filed Weights   04/17/23 1633 04/17/23 2033 04/19/23 1000  Weight: 100.3 kg 98.5 kg 98 kg    Intake/Output: I/O last 3 completed shifts: In: 220 [P.O.:120; IV Piggyback:100] Out: -    Intake/Output this shift:  No intake/output data recorded.  Physical Exam: General: NAD,   Head: Normocephalic, atraumatic. Moist oral mucosal membranes  Eyes: Anicteric  Lungs:  Clear to auscultation, normal effort  Heart: Regular rate and rhythm  Abdomen:  Soft, nontender,   Extremities:  Right LE ++ weeping edema, erythema and induration  Neurologic: Nonfocal, moving all four extremities  Skin: No lesions  Access: RIJ permcath    Basic Metabolic Panel: Recent Labs  Lab 04/17/23 0933 04/19/23 0907  NA 138 135  K 3.8 3.7  CL 100 101  CO2 21* 20*  GLUCOSE 70 158*  BUN 44* 53*  CREATININE 7.42* 6.76*  CALCIUM 8.7* 8.3*  PHOS  --  2.9    Liver Function Tests: Recent Labs  Lab 04/17/23 0933 04/19/23 0907  AST 117*  --   ALT 69*  --   ALKPHOS 146*  --   BILITOT 0.9  --   PROT 7.0  --   ALBUMIN 3.3* 2.7*   No  results for input(s): "LIPASE", "AMYLASE" in the last 168 hours.  No results for input(s): "AMMONIA" in the last 168 hours.  CBC: Recent Labs  Lab 04/17/23 0933 04/18/23 0535 04/19/23 0907  WBC 4.5 5.8 7.4  NEUTROABS 3.9  --   --   HGB 13.2 12.6* 12.1*  HCT 40.9 38.8* 36.8*  MCV 101.5* 100.8* 99.2  PLT 153 148* 126*    Cardiac Enzymes: No results for input(s): "CKTOTAL", "CKMB", "CKMBINDEX", "TROPONINI" in the last 168 hours.  BNP: Invalid input(s): "POCBNP"  CBG: Recent Labs  Lab 04/18/23 0836 04/18/23 1208 04/18/23 1635 04/18/23 2121 04/19/23 0816  GLUCAP 213* 189* 209* 172* 132*    Microbiology: Results for orders placed or performed during the hospital encounter of 04/17/23  SARS Coronavirus 2 by RT PCR (hospital order, performed in [facility] hospital lab) *cepheid single result test* Anterior Nasal Swab     Status: None   Collection Time: 04/17/23 10:57 AM   Specimen: Anterior Nasal Swab  Result Value Ref Range Status   SARS Coronavirus 2 by RT PCR NEGATIVE NEGATIVE Final    Comment: (NOTE) SARS-CoV-2 target nucleic acids are NOT DETECTED.  The SARS-CoV-2 RNA is generally detectable in upper and lower respiratory specimens during the acute phase of infection. The lowest concentration of SARS-CoV-2 viral copies this assay can detect is 250 copies / mL. A negative result does not preclude  SARS-CoV-2 infection and should not be used as the sole basis for treatment or other patient management decisions.  A negative result may occur with improper specimen collection / handling, submission of specimen other than nasopharyngeal swab, presence of viral mutation(s) within the areas targeted by this assay, and inadequate number of viral copies (<250 copies / mL). A negative result must be combined with clinical observations, patient history, and epidemiological information.  Fact Sheet for Patients:   RoadLapTop.co.za  Fact Sheet for  Healthcare Providers: http://kim-miller.com/  This test is not yet approved or  cleared by the [place] FDA and has been authorized for detection and/or diagnosis of SARS-CoV-2 by FDA under an Emergency Use Authorization (EUA).  This EUA will remain in effect (meaning this test can be used) for the duration of the COVID-19 declaration under Section 564(b)(1) of the Act, 21 U.S.C. section 360bbb-3(b)(1), unless the authorization is terminated or revoked sooner.  Performed at [facility], [address] [place], [place] [address]   Blood culture (routine x 2)     Status: None (Preliminary result)   Collection Time: 04/17/23 10:57 AM   Specimen: BLOOD  Result Value Ref Range Status   Specimen Description BLOOD LARM  Final   Special Requests   Final    BOTTLES DRAWN AEROBIC AND ANAEROBIC Blood Culture results may not be optimal due to an inadequate volume of blood received in culture bottles   Culture   Final    NO GROWTH 2 DAYS Performed at [facility], [address], [place], [place] [address]    Report Status PENDING  Incomplete  Blood culture (routine x 2)     Status: None (Preliminary result)   Collection Time: 04/17/23 10:57 AM   Specimen: BLOOD  Result Value Ref Range Status   Specimen Description BLOOD RARM  Final   Special Requests   Final    BOTTLES DRAWN AEROBIC AND ANAEROBIC Blood Culture adequate volume   Culture   Final    NO GROWTH 2 DAYS Performed at [facility], [address] [place], [place] [address]    Report Status PENDING  Incomplete    Coagulation Studies: No results for input(s): "LABPROT", "INR" in the last 72 hours.  Urinalysis: No results for input(s): "COLORURINE", "LABSPEC", "PHURINE", "GLUCOSEU", "HGBUR", "BILIRUBINUR", "KETONESUR", "PROTEINUR", "UROBILINOGEN", "NITRITE", "LEUKOCYTESUR" in the last 72 hours.  Invalid input(s): "APPERANCEUR"    Imaging: DG Tibia/Fibula  Right  Result Date: 04/17/2023 CLINICAL DATA:  Shortness of breath.  Leg pain EXAM: RIGHT TIBIA AND FIBULA - 2 VIEW COMPARISON:  11/27/2021 FINDINGS: Prior ORIF of the proximal right tibia and distal femur. There is no evidence of acute fracture or other focal bone lesions. Healed fracture deformity of the proximal fibula. Generalized soft tissue swelling. Atherosclerotic vascular calcifications. IMPRESSION: Generalized soft tissue swelling.  No acute findings. Electronically Signed   By: Duanne Guess D.O.   On: 04/17/2023 13:41   DG Chest Portable 1 View  Result Date: 04/17/2023 CLINICAL DATA:  Shortness of breath.  Evaluate for edema. EXAM: PORTABLE CHEST 1 VIEW COMPARISON:  03/20/2023 FINDINGS: There is a right chest wall dialysis catheter with tips in the right atrium. Previous median sternotomy and CABG procedure. Heart size and mediastinal contours appear normal. Lung volumes are low. Scar versus platelike atelectasis noted in the left base. No pleural effusion, interstitial edema or airspace disease. IMPRESSION: 1. No signs of interstitial edema or pleural effusion. 2. Decreased lung volumes with bibasilar scar versus platelike  atelectasis. 3. Low lung volumes. Electronically Signed   By: Signa Kell M.D.   On: 04/17/2023 13:13   US Venous Img Lower Unilateral Right  Result Date: 04/17/2023 CLINICAL DATA:  Right lower extremity edema. EXAM: RIGHT LOWER EXTREMITY VENOUS DOPPLER ULTRASOUND TECHNIQUE: Gray-scale sonography with graded compression, as well as color Doppler and duplex ultrasound were performed to evaluate the lower extremity deep venous systems from the level of the common femoral vein and including the common femoral, femoral, profunda femoral, popliteal and calf veins including the posterior tibial, peroneal and gastrocnemius veins when visible. The superficial great saphenous vein was also interrogated. Spectral Doppler was utilized to evaluate flow at rest and with distal  augmentation maneuvers in the common femoral, femoral and popliteal veins. COMPARISON:  None Available. FINDINGS: Contralateral Common Femoral Vein: Respiratory phasicity is normal and symmetric with the symptomatic side. No evidence of thrombus. Normal compressibility. Common Femoral Vein: No evidence of thrombus. Normal compressibility, respiratory phasicity and response to augmentation. Saphenofemoral Junction: No evidence of thrombus. Normal compressibility and flow on color Doppler imaging. Profunda Femoral Vein: No evidence of thrombus. Normal compressibility and flow on color Doppler imaging. Femoral Vein: No evidence of thrombus. Normal compressibility, respiratory phasicity and response to augmentation. Popliteal Vein: No evidence of thrombus. Normal compressibility, respiratory phasicity and response to augmentation. Calf Veins: No evidence of thrombus. Normal compressibility and flow on color Doppler imaging. Superficial Great Saphenous Vein: No evidence of thrombus. Normal compressibility. Venous Reflux:  None. Other Findings: No evidence of superficial thrombophlebitis or abnormal fluid collection. There is a single visualized right inguinal lymph node measuring roughly 3.0 x 1.9 x 2.0 cm that demonstrates atypical morphology without a visualized fatty hilum. This is nonspecific and may still potentially represent a reactive lymph node. Lymph node pathology cannot be excluded based on sonographic appearance. IMPRESSION: 1. No evidence of right lower extremity deep venous thrombosis. 2. Single visualized right inguinal lymph node measuring roughly 2 cm in short axis. This is nonspecific and may represent a reactive lymph node. Lymph node pathology cannot be excluded based on atypical sonographic appearance. Electronically Signed   By: Irish Lack M.D.   On: 04/17/2023 12:11     Medications:    cefTRIAXone (ROCEPHIN)  IV 2 g (04/18/23 1149)   vancomycin      apixaban  2.5 mg Oral BID    atorvastatin  80 mg Oral QHS   bisacodyl  10 mg Oral Daily   calcium carbonate  500 mg Oral BID   Chlorhexidine Gluconate Cloth  6 each Topical Q0600   folic acid  1 mg Oral Daily   gabapentin  100 mg Oral QHS   insulin aspart  0-6 Units Subcutaneous TID WC   melatonin  5 mg Oral QHS   pantoprazole  40 mg Oral Q12H   polyethylene glycol  17 g Oral QHS   predniSONE  10 mg Oral Q breakfast   senna-docusate  2 tablet Oral BID   simethicone  80 mg Oral BID   sodium chloride flush  3 mL Intravenous Q12H   tamsulosin  0.4 mg Oral QHS   torsemide  60 mg Oral Daily   acetaminophen **OR** acetaminophen, alteplase, heparin, ondansetron **OR** ondansetron (ZOFRAN) IV, oxyCODONE, polyethylene glycol  Assessment/ Plan:  [name] is a 67 y.o.  male with end stage renal disease on hemodialysis, hypertension, diabetes mellitus type II, hyperlipidemia, atrial fibrillation, coronary artery disease status post CABG, peptic ulcer disease and prostate cancer who is  admitted to [facility] on 04/17/2023 for Right leg pain [M79.604] Cellulitis of right leg [L03.115] Cellulitis of right lower extremity [L03.115]  [facility]  End Stage Renal Disease: requiring four days a week due to volume control - Dialysis today with UF goal 2L as tolerated.  - Next treatment scheduled for Friday.   Hypertension with chronic kidney disease: hypotensive on encounter.  - restarted tamsulosin - holding home carvedilol, torsemide, and amlodipine.  - Blood pressure 96/78 during dialysis   Anemia of chronic kidney disease: hemoglobin stable 12.1. Holding ESA due to suspected malignancy.   Secondary Hyperparathyroidism: not currently phosphate binders. Calcium and phosphorus within desired range.   5. Cellulitis, right lower leg. Remains on antibiotics per primary team. Erythema improving, edema remains present. Edema due to infections, diuretics not appropriate.    LOS: 1   8/28/202412:02 PM

## 2023-04-19 NOTE — TOC CM/SW Note (Signed)
Patient is a resident at [facility]. CSW spoke to someone to ask if he could return today. She said that patient would likely have to be assessed but that she would have someone call this CSW back. MD is aware.  Charlynn Court, CSW [phone]

## 2023-04-19 NOTE — Progress Notes (Signed)
PT Cancellation Note  Patient Details Name: Micheal Aguirre MRN: [id number] DOB: 06/11/1956   Cancelled Treatment:    Reason Eval/Treat Not Completed: Patient at procedure or test/unavailable Orders received, chart reviewed. Patient off unit at dialysis. Will re-attempt at later date/time as schedule permits.   Maylon Peppers, PT, DPT Physical Therapist - [facility]  [facility]    Nancey Kreitz A Dashonda Bonneau 04/19/2023, 10:41 AM

## 2023-04-19 NOTE — Discharge Planning (Signed)
ESTABLISHED HEMODIALYSIS Outpatient Facility   [facility] [address] [place], [place] [address] [phone]  Scheduled Days: Monday Wednesday and Friday  Treatment Time: 10:10am  Patient is LTC at [facility]. Confirmed above schedule with clinic, patient is active and can resume upon discharge. Informed clinic on patient status and that patient is currently on IV ABX. Will follow up with clinic if OP IV ABX is needed.   Dimas Chyle Dialysis Coordinator II  [facility] Cell: [phone] eFax: [phone] [email or web address]

## 2023-04-20 DIAGNOSIS — I5032 Chronic diastolic (congestive) heart failure: Secondary | ICD-10-CM | POA: Diagnosis not present

## 2023-04-20 DIAGNOSIS — N186 End stage renal disease: Secondary | ICD-10-CM | POA: Diagnosis not present

## 2023-04-20 DIAGNOSIS — C61 Malignant neoplasm of prostate: Secondary | ICD-10-CM | POA: Diagnosis not present

## 2023-04-20 DIAGNOSIS — L03115 Cellulitis of right lower limb: Secondary | ICD-10-CM | POA: Diagnosis not present

## 2023-04-20 LAB — CBC
HCT: 34.2 % — ABNORMAL LOW (ref 39.0–52.0)
Hemoglobin: 11.5 g/dL — ABNORMAL LOW (ref 13.0–17.0)
MCH: 33 pg (ref 26.0–34.0)
MCHC: 33.6 g/dL (ref 30.0–36.0)
MCV: 98.3 fL (ref 80.0–100.0)
Platelets: 120 10*3/uL — ABNORMAL LOW (ref 150–400)
RBC: 3.48 MIL/uL — ABNORMAL LOW (ref 4.22–5.81)
RDW: 15.7 % — ABNORMAL HIGH (ref 11.5–15.5)
WBC: 8.2 10*3/uL (ref 4.0–10.5)
nRBC: 1.7 % — ABNORMAL HIGH (ref 0.0–0.2)

## 2023-04-20 LAB — BASIC METABOLIC PANEL
Anion gap: 16 — ABNORMAL HIGH (ref 5–15)
BUN: 37 mg/dL — ABNORMAL HIGH (ref 8–23)
CO2: 25 mmol/L (ref 22–32)
Calcium: 8.5 mg/dL — ABNORMAL LOW (ref 8.9–10.3)
Chloride: 95 mmol/L — ABNORMAL LOW (ref 98–111)
Creatinine, Ser: 4.83 mg/dL — ABNORMAL HIGH (ref 0.61–1.24)
GFR, Estimated: 12 mL/min — ABNORMAL LOW (ref >60)
Glucose, Bld: 133 mg/dL — ABNORMAL HIGH (ref 70–99)
Potassium: 3.7 mmol/L (ref 3.5–5.1)
Sodium: 136 mmol/L (ref 135–145)

## 2023-04-20 LAB — GLUCOSE, CAPILLARY
Glucose-Capillary: 117 mg/dL — ABNORMAL HIGH (ref 70–99)
Glucose-Capillary: 142 mg/dL — ABNORMAL HIGH (ref 70–99)
Glucose-Capillary: 91 mg/dL (ref 70–99)
Glucose-Capillary: 98 mg/dL (ref 70–99)

## 2023-04-20 MED ORDER — HEPARIN SODIUM (PORCINE) 1000 UNIT/ML DIALYSIS: 1000.0000 [IU] | INTRAMUSCULAR | Status: DC | PRN

## 2023-04-20 MED ORDER — ALTEPLASE 2 MG IJ SOLR: 2.0000 mg | Freq: Once | INTRAMUSCULAR | Status: DC | PRN

## 2023-04-20 NOTE — NC FL2 (Signed)
[place] MEDICAID FL2 LEVEL OF CARE FORM     IDENTIFICATION  Patient Name: Micheal Aguirre Birthdate: [birth year] Sex: male Admission Date (Current Location): 04/17/2023  [facility] and [place] Number:  Chiropodist and Address:         Provider Number: [id number]  Attending Physician Name and Address:  Joycelyn Das, MD  Relative Name and Phone Number:       Current Level of Care: Hospital Recommended Level of Care: Assisted Living Facility Prior Approval Number:    Date Approved/Denied:   PASRR Number:    Discharge Plan: Other (Comment) (ALF)    Current Diagnoses: Patient Active Problem List   Diagnosis Date Noted   Cellulitis of right leg 04/17/2023   Elevated LFTs 04/17/2023   Type II diabetes mellitus with renal manifestations (HCC) 02/03/2022   HTN (hypertension) 02/03/2022   HLD (hyperlipidemia) 02/03/2022   Atrial fibrillation, chronic (HCC) 02/03/2022   Fall at home, initial encounter 02/03/2022   Hypokalemia 02/03/2022   Closed fracture of right distal femur (HCC) 02/03/2022   Chronic diastolic CHF (congestive heart failure) (HCC) 11/30/2021   DMII (diabetes mellitus, type 2) (HCC) 11/30/2021   Prostate cancer (HCC) 11/30/2021   Left renal mass 11/30/2021   ESRD on dialysis [facility])    Primary hypertension    Closed fracture of lateral portion of right tibial plateau 11/26/2021    Orientation RESPIRATION BLADDER Height & Weight     Self, Time, Situation, Place  Normal Continent Weight: 98.5 kg Height:  5\' 9"  (175.3 cm)  BEHAVIORAL SYMPTOMS/MOOD NEUROLOGICAL BOWEL NUTRITION STATUS      Incontinent Diet (Renal carb modified)  AMBULATORY STATUS COMMUNICATION OF NEEDS Skin   Extensive Assist Verbally Other (Comment) (Cellulitis. Cleanse area with saline, pat dry Apply single layer of xeroform gauze to the affected areas. Top with dry dressing. Secure with kerlix. Wrap from toes to knees. Change daily.)                       Personal Care  Assistance Level of Assistance              Functional Limitations Info             SPECIAL CARE FACTORS FREQUENCY  PT (By licensed PT), OT (By licensed OT)                    Contractures Contractures Info: Not present    Additional Factors Info  Code Status, Allergies Code Status Info: Full Allergies Info: Hydralazine, Lisinopril, Bimatoprost, Empagliflozin           Current Medications (04/20/2023):  This is the current hospital active medication list Current Facility-Administered Medications  Medication Dose Route Frequency Provider Last Rate Last Admin   acetaminophen (TYLENOL) tablet 650 mg  650 mg Oral Q6H PRN Verdene Lennert, MD       Or   acetaminophen (TYLENOL) suppository 650 mg  650 mg Rectal Q6H PRN Verdene Lennert, MD       apixaban Everlene Balls) tablet 2.5 mg  2.5 mg Oral BID Verdene Lennert, MD   2.5 mg at 04/20/23 0905   atorvastatin (LIPITOR) tablet 80 mg  80 mg Oral QHS Verdene Lennert, MD   80 mg at 04/19/23 2113   bisacodyl (DULCOLAX) EC tablet 10 mg  10 mg Oral Daily Verdene Lennert, MD   10 mg at 04/20/23 0904   calcium carbonate (TUMS - dosed in mg elemental calcium) chewable tablet 500 mg  500 mg Oral BID Verdene Lennert, MD   500 mg at 04/20/23 0905   cefTRIAXone (ROCEPHIN) 2 g in sodium chloride 0.9 % 100 mL IVPB  2 g Intravenous Q24H Verdene Lennert, MD 200 mL/hr at 04/19/23 1506 2 g at 04/19/23 1506   Chlorhexidine Gluconate Cloth 2 % PADS 6 each  6 each Topical Q0600 Lamont Dowdy, MD   6 each at 04/20/23 0528   folic acid (FOLVITE) tablet 1 mg  1 mg Oral Daily Verdene Lennert, MD   1 mg at 04/20/23 0906   gabapentin (NEURONTIN) capsule 100 mg  100 mg Oral QHS Verdene Lennert, MD   100 mg at 04/19/23 2114   insulin aspart (novoLOG) injection 0-6 Units  0-6 Units Subcutaneous TID WC Verdene Lennert, MD   2 Units at 04/18/23 1658   melatonin tablet 5 mg  5 mg Oral QHS Verdene Lennert, MD   5 mg at 04/19/23 2113   ondansetron (ZOFRAN) tablet 4  mg  4 mg Oral Q6H PRN Verdene Lennert, MD       Or   ondansetron (ZOFRAN) injection 4 mg  4 mg Intravenous Q6H PRN Verdene Lennert, MD       oxyCODONE (Oxy IR/ROXICODONE) immediate release tablet 5 mg  5 mg Oral Q8H PRN Verdene Lennert, MD   5 mg at 04/20/23 0134   pantoprazole (PROTONIX) EC tablet 40 mg  40 mg Oral Q12H Verdene Lennert, MD   40 mg at 04/20/23 0905   polyethylene glycol (MIRALAX / GLYCOLAX) packet 17 g  17 g Oral Daily PRN Verdene Lennert, MD       polyethylene glycol (MIRALAX / GLYCOLAX) packet 17 g  17 g Oral QHS Verdene Lennert, MD   17 g at 04/18/23 2129   predniSONE (DELTASONE) tablet 10 mg  10 mg Oral Q breakfast Verdene Lennert, MD   10 mg at 04/20/23 1610   senna-docusate (Senokot-S) tablet 2 tablet  2 tablet Oral BID Verdene Lennert, MD   2 tablet at 04/20/23 0905   simethicone (MYLICON) chewable tablet 80 mg  80 mg Oral BID Verdene Lennert, MD   80 mg at 04/20/23 0905   sodium chloride flush (NS) 0.9 % injection 3 mL  3 mL Intravenous Q12H Verdene Lennert, MD   3 mL at 04/19/23 2119   tamsulosin (FLOMAX) capsule 0.4 mg  0.4 mg Oral QHS Verdene Lennert, MD   0.4 mg at 04/19/23 2114   torsemide (DEMADEX) tablet 60 mg  60 mg Oral Daily Verdene Lennert, MD   60 mg at 04/20/23 0904   vancomycin (VANCOCIN) IVPB 1000 mg/200 mL premix  1,000 mg Intravenous Q M,W,F-HD Effie Shy, RPH 200 mL/hr at 04/19/23 1555 1,000 mg at 04/19/23 1555     Discharge Medications: Please see discharge summary for a list of discharge medications.  Relevant Imaging Results:  Relevant Lab Results:   Additional Information SS #: [id number]  Chapman Fitch, RN

## 2023-04-20 NOTE — NC FL2 (Signed)
[place] MEDICAID FL2 LEVEL OF CARE FORM     IDENTIFICATION  Patient Name: Micheal Aguirre Birthdate: 1955-11-14 Sex: male Admission Date (Current Location): 04/17/2023  [facility] and [place] Number:  Chiropodist and Address:         Provider Number: [id number]  Attending Physician Name and Address:  Joycelyn Das, MD  Relative Name and Phone Number:       Current Level of Care: Hospital Recommended Level of Care: Skilled Nursing Facility Prior Approval Number:    Date Approved/Denied:   PASRR Number: [id number]  Discharge Plan: SNF    Current Diagnoses: Patient Active Problem List   Diagnosis Date Noted   Cellulitis of right leg 04/17/2023   Elevated LFTs 04/17/2023   Type II diabetes mellitus with renal manifestations (HCC) 02/03/2022   HTN (hypertension) 02/03/2022   HLD (hyperlipidemia) 02/03/2022   Atrial fibrillation, chronic (HCC) 02/03/2022   Fall at home, initial encounter 02/03/2022   Hypokalemia 02/03/2022   Closed fracture of right distal femur (HCC) 02/03/2022   Chronic diastolic CHF (congestive heart failure) (HCC) 11/30/2021   DMII (diabetes mellitus, type 2) (HCC) 11/30/2021   Prostate cancer (HCC) 11/30/2021   Left renal mass 11/30/2021   ESRD on dialysis [facility])    Primary hypertension    Closed fracture of lateral portion of right tibial plateau 11/26/2021    Orientation RESPIRATION BLADDER Height & Weight     Time, Self, Situation, Place  Normal Continent Weight: 97.1 kg Height:  5\' 9"  (175.3 cm)  BEHAVIORAL SYMPTOMS/MOOD NEUROLOGICAL BOWEL NUTRITION STATUS      Incontinent Diet (Renal carb modifed)  AMBULATORY STATUS COMMUNICATION OF NEEDS Skin   Extensive Assist Verbally Other (Comment) (Cellulitis. Cleanse area with saline, pat dry Apply single layer of xeroform gauze to the affected areas. Top with dry dressing. Secure with kerlix. Wrap from toes to knees. Change daily.)                       Personal Care Assistance  Level of Assistance              Functional Limitations Info             SPECIAL CARE FACTORS FREQUENCY  PT (By licensed PT), OT (By licensed OT)                    Contractures Contractures Info: Not present    Additional Factors Info  Code Status, Allergies (Hemodialysis) Code Status Info: Full Allergies Info: Hydralazine, Lisinopril, Bimatoprost, Empagliflozin           Current Medications (04/20/2023):  This is the current hospital active medication list Current Facility-Administered Medications  Medication Dose Route Frequency Provider Last Rate Last Admin   acetaminophen (TYLENOL) tablet 650 mg  650 mg Oral Q6H PRN Verdene Lennert, MD       Or   acetaminophen (TYLENOL) suppository 650 mg  650 mg Rectal Q6H PRN Verdene Lennert, MD       apixaban Everlene Balls) tablet 2.5 mg  2.5 mg Oral BID Verdene Lennert, MD   2.5 mg at 04/20/23 0905   atorvastatin (LIPITOR) tablet 80 mg  80 mg Oral QHS Verdene Lennert, MD   80 mg at 04/19/23 2113   bisacodyl (DULCOLAX) EC tablet 10 mg  10 mg Oral Daily Verdene Lennert, MD   10 mg at 04/20/23 0904   calcium carbonate (TUMS - dosed in mg elemental calcium) chewable tablet 500 mg  500  mg Oral BID Verdene Lennert, MD   500 mg at 04/20/23 5621   Chlorhexidine Gluconate Cloth 2 % PADS 6 each  6 each Topical Q0600 Lamont Dowdy, MD   6 each at 04/20/23 0528   folic acid (FOLVITE) tablet 1 mg  1 mg Oral Daily Verdene Lennert, MD   1 mg at 04/20/23 0906   gabapentin (NEURONTIN) capsule 100 mg  100 mg Oral QHS Verdene Lennert, MD   100 mg at 04/19/23 2114   insulin aspart (novoLOG) injection 0-6 Units  0-6 Units Subcutaneous TID WC Verdene Lennert, MD   2 Units at 04/18/23 1658   melatonin tablet 5 mg  5 mg Oral QHS Verdene Lennert, MD   5 mg at 04/19/23 2113   ondansetron (ZOFRAN) tablet 4 mg  4 mg Oral Q6H PRN Verdene Lennert, MD       Or   ondansetron (ZOFRAN) injection 4 mg  4 mg Intravenous Q6H PRN Verdene Lennert, MD        oxyCODONE (Oxy IR/ROXICODONE) immediate release tablet 5 mg  5 mg Oral Q8H PRN Verdene Lennert, MD   5 mg at 04/20/23 0947   pantoprazole (PROTONIX) EC tablet 40 mg  40 mg Oral Q12H Verdene Lennert, MD   40 mg at 04/20/23 0905   polyethylene glycol (MIRALAX / GLYCOLAX) packet 17 g  17 g Oral Daily PRN Verdene Lennert, MD       polyethylene glycol (MIRALAX / GLYCOLAX) packet 17 g  17 g Oral QHS Verdene Lennert, MD   17 g at 04/18/23 2129   predniSONE (DELTASONE) tablet 10 mg  10 mg Oral Q breakfast Verdene Lennert, MD   10 mg at 04/20/23 3086   senna-docusate (Senokot-S) tablet 2 tablet  2 tablet Oral BID Verdene Lennert, MD   2 tablet at 04/20/23 0905   simethicone (MYLICON) chewable tablet 80 mg  80 mg Oral BID Verdene Lennert, MD   80 mg at 04/20/23 0905   sodium chloride flush (NS) 0.9 % injection 3 mL  3 mL Intravenous Q12H Verdene Lennert, MD   3 mL at 04/20/23 0913   tamsulosin (FLOMAX) capsule 0.4 mg  0.4 mg Oral QHS Verdene Lennert, MD   0.4 mg at 04/19/23 2114   torsemide (DEMADEX) tablet 60 mg  60 mg Oral Daily Verdene Lennert, MD   60 mg at 04/20/23 5784     Discharge Medications: Please see discharge summary for a list of discharge medications.  Relevant Imaging Results:  Relevant Lab Results:   Additional Information SS #: [id number]  Chapman Fitch, RN

## 2023-04-20 NOTE — TOC Progression Note (Signed)
Transition of Care [facility]) - Progression Note    Patient Details  Name: Micheal Aguirre MRN: [id number] Date of Birth: Aug 20, 1956  Transition of Care [facility]) CM/SW Contact  Chapman Fitch, RN Phone Number: 04/20/2023, 11:30 AM  Clinical Narrative:     Sherron Monday to Chana Bode at [facility].  She request that Fl2 and updated therapy note be faxed  Faxed to [phone]       Expected Discharge Plan and Services                                               Social Determinants of Health (SDOH) Interventions SDOH Screenings   Food Insecurity: No Food Insecurity (04/17/2023)  Housing: Low Risk  (04/17/2023)  Transportation Needs: No Transportation Needs (04/17/2023)  Utilities: Not At Risk (04/17/2023)  Tobacco Use: Medium Risk (04/17/2023)    Readmission Risk Interventions     No data to display

## 2023-04-20 NOTE — TOC Progression Note (Signed)
Transition of Care [facility]) - Progression Note    Patient Details  Name: Cleave Shroff MRN: [id number] Date of Birth: [birth year]  Transition of Care [facility]) CM/SW Contact  Chapman Fitch, RN Phone Number: 04/20/2023, 1:09 PM  Clinical Narrative:     Received notification from Chana Bode at [facility] that their executive director states patient will need to go to STR prior to returning.  They are aware that with home health recs insurance may not approve, but request to attempt to get him placed  Updated fl2 sent for signature Bed search initiated        Expected Discharge Plan and Services                                               Social Determinants of Health (SDOH) Interventions SDOH Screenings   Food Insecurity: No Food Insecurity (04/17/2023)  Housing: Low Risk  (04/17/2023)  Transportation Needs: No Transportation Needs (04/17/2023)  Utilities: Not At Risk (04/17/2023)  Tobacco Use: Medium Risk (04/17/2023)    Readmission Risk Interventions    04/20/2023   11:32 AM  Readmission Risk Prevention Plan  Transportation Screening Complete  Medication Review (RN Care Manager) Complete  HRI or Home Care Consult Complete  SW Recovery Care/Counseling Consult Complete  Palliative Care Screening Complete  Skilled Nursing Facility Not Applicable

## 2023-04-20 NOTE — Progress Notes (Signed)
PROGRESS NOTE    Micheal Aguirre  ION:[id number] DOB: Jun 26, 1956 DOA: 04/17/2023 PCP: Almetta Lovely, [facility]    Brief Narrative:    Micheal Aguirre is a 67 y.o. 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  In the ED, patient was started on IV cefepime vancomycin and Flagyl and was admitted hospital for right leg cellulitis.  At this time patient has been sent to physical therapy and recommend home health PT on discharge but facility requesting rehabilitation prior to discharge..  Assessment and plan.  Cellulitis of right leg Mild erythema but edema noted..  No evidence of sepsis.  Patient received IV Rocephin and vancomycin.  Blood cultures negative so far.  Continue oxycodone for pain, recommend elevation of the leg.  Will discontinue vancomycin and continue Rocephin to complete 7-day course.  ESRD on dialysis  Undergoing hemodialysis as per nephrology.  Undergoes 4 days a week.  Seen during hemodialysis.  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 Continue outpatient follow-up with oncology  Chronic diastolic CHF (congestive heart failure)  On hemodialysis for fluid management.  Compensated  HTN (hypertension) Labile hypertension..  Blood pressure is stable at this time.  On amlodipine at home.  Antihypertensives on hold at this time.  Elevated LFTs Likely secondary to metastatic liver lesion.  Follow-up as outpatient.  Hepatitis B surface antigen nonreactive.  Check LFTs in AM.  Type II  diabetes mellitus with renal manifestations (HCC) Received sliding scale insulin during hospitalization.  Diet controlled.  Latest POC glucose of 142  Atrial fibrillation, chronic (HCC) Continue Eliquis for anticoagulation.  On Coreg at home.  Deconditioning, debility.  Continue physical therapy while in the hospital.  Plan for skilled nursing facility placement.    DVT prophylaxis: apixaban (ELIQUIS) tablet 2.5 mg Start: 04/17/23 2200 apixaban (ELIQUIS) tablet 2.5 mg   Code Status:     Code Status: Full Code  Disposition: Skilled nursing facility, TOC on board.  Status is: Inpatient Remains inpatient appropriate because: IV antibiotic, need for skilled nursing facility placement.   Family Communication: None at bedside  Consultants:  Nephrology  Procedures:  Hemodialysis  Antimicrobials:  Rocephin IV  Anti-infectives (From admission, onward)    Start     Dose/Rate Route Frequency Ordered Stop   04/19/23 1200  vancomycin (VANCOCIN) IVPB 1000 mg/200 mL premix  Status:  Discontinued        1,000 mg 200 mL/hr over 60 Minutes Intravenous Every M-W-F (Hemodialysis) 04/18/23 1216 04/20/23 1256   04/18/23 1200  cefTRIAXone (ROCEPHIN) 2 g in sodium chloride 0.9 % 100 mL IVPB  Status:  Discontinued        2 g 200 mL/hr over 30 Minutes Intravenous Every 24 hours 04/17/23 1347 04/20/23 1256   04/17/23 2200  metroNIDAZOLE (FLAGYL) IVPB 500 mg  Status:  Discontinued        500 mg 100 mL/hr over 60 Minutes Intravenous Every 8 hours 04/17/23 1347 04/17/23 1348   04/17/23 1900  vancomycin (VANCOCIN) IVPB 1000 mg/200 mL premix        1,000 mg 200 mL/hr  over 60 Minutes Intravenous  Once 04/17/23 1652 04/17/23 2047   04/17/23 1245  vancomycin (VANCOREADY) IVPB 2000 mg/400 mL        2,000 mg 200 mL/hr over 120 Minutes Intravenous  Once 04/17/23 1234 04/17/23 2048   04/17/23 1230  ceFEPIme (MAXIPIME) 2 g in sodium chloride 0.9 % 100 mL IVPB        2 g 200 mL/hr over 30 Minutes  Intravenous  Once 04/17/23 1217 04/17/23 1315   04/17/23 1230  metroNIDAZOLE (FLAGYL) IVPB 500 mg        500 mg 100 mL/hr over 60 Minutes Intravenous  Once 04/17/23 1217 04/17/23 1441   04/17/23 1230  vancomycin (VANCOCIN) IVPB 1000 mg/200 mL premix  Status:  Discontinued        1,000 mg 200 mL/hr over 60 Minutes Intravenous  Once 04/17/23 1217 04/17/23 1234      Subjective: Today, patient was seen and examined at bedside.  Seen during hemodialysis.  Patient denies any nausea, vomiting or shortness of breath or chest pain.  Complains of swelling of his right lower extremity but not much redness but mild pain.  Objective: Vitals:   04/20/23 1200 04/20/23 1230 04/20/23 1300 04/20/23 1330  BP: 124/83 112/84 107/77 (!) 117/95  Pulse: 87 93  68  Resp: 12 16 19 13   Temp:      TempSrc:      SpO2: 100% 100% 100% 100%  Weight:      Height:        Intake/Output Summary (Last 24 hours) at 04/20/2023 1337 Last data filed at 04/19/2023 1923 Gross per 24 hour  Intake 540 ml  Output 1407 ml  Net -867 ml   Filed Weights   04/19/23 1417 04/20/23 0358 04/20/23 1143  Weight: 96.5 kg 98.5 kg 97.1 kg    Physical Examination: Body mass index is 31.61 kg/m.   General: Obese built, not in obvious distress HENT:   No scleral pallor or icterus noted. Oral mucosa is moist.  Chest:   Diminished breath sounds bilaterally. No crackles or wheezes.  Right chest wall dialysis catheter CVS: S1 &S2 heard. No murmur.  Regular rate and rhythm. Abdomen: Soft, nontender, nondistended.  Bowel sounds are heard.   Extremities: No cyanosis, clubbing but right lower extremity with erythema and edema. Psych: Alert, awake and oriented, normal mood CNS:  No cranial nerve deficits.  Moves all extremities. Skin: Warm and dry.  Right lower extremity erythema edema  Data Reviewed:   CBC: Recent Labs  Lab 04/17/23 0933 04/18/23 0535 04/19/23 0907 04/20/23 0339  WBC 4.5 5.8 7.4 8.2  NEUTROABS 3.9  --   --    --   HGB 13.2 12.6* 12.1* 11.5*  HCT 40.9 38.8* 36.8* 34.2*  MCV 101.5* 100.8* 99.2 98.3  PLT 153 148* 126* 120*    Basic Metabolic Panel: Recent Labs  Lab 04/17/23 0933 04/19/23 0907 04/20/23 0339  NA 138 135 136  K 3.8 3.7 3.7  CL 100 101 95*  CO2 21* 20* 25  GLUCOSE 70 158* 133*  BUN 44* 53* 37*  CREATININE 7.42* 6.76* 4.83*  CALCIUM 8.7* 8.3* 8.5*  PHOS  --  2.9  --     Liver Function Tests: Recent Labs  Lab 04/17/23 0933 04/19/23 0907  AST 117*  --   ALT 69*  --   ALKPHOS 146*  --   BILITOT 0.9  --   PROT 7.0  --   ALBUMIN 3.3* 2.7*  Radiology Studies: No results found.    LOS: 2 days     Joycelyn Das, MD [facility] Available via Epic secure chat 7am-7pm After these hours, please refer to coverage provider listed on [email or web address] 04/20/2023, 1:37 PM

## 2023-04-20 NOTE — Progress Notes (Signed)
Hemodialysis Note  Received patient in bed to unit. Alert and oriented. Informed consent signed and in chart.   Treatment initiated:1137 Treatment completed:1527  Patient tolerated treatment well. Transported back to the room alert, without acute distress. Report given to patient's RN.  Access used:Right Subclavian Catheter  Access issues:None   Total UF removed:2034 ml  Medications given: Heparin  Post HD VS: Stable  Post HD weight:97 kg Bed scale   Bartolo Darter, RN  [facility]

## 2023-04-20 NOTE — Evaluation (Addendum)
Occupational Therapy Evaluation Patient Details Name: Micheal Aguirre MRN: [id number] DOB: Aug 31, 1955 Today's Date: 04/20/2023   History of Present Illness Pt is a 67 year old male presenting to the ED from ALF with c/o R leg pain from the knee down, admitted with R leg cellulitis.     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 Pt is a ?historian however pt lives at ALF, has assist for ADL/IADL, transfers to [place] and self propels mwc. Pt presents with deficits in activity tolerance, balance, safety, cognition (although has cog deficits at baseline per chart) affecting safe and optimal ADL completion. Pt reports he feels he is not far off baseline, limited by RLE pain. Pt will benefit from acute OT to address deficits and to facilitate return to PLOF.       If plan is discharge home, recommend the following: A little help with walking and/or transfers;A lot of help with walking and/or transfers;Direct supervision/assist for financial management;Help with stairs or ramp for entrance;Supervision due to cognitive status;Assist for transportation;Direct supervision/assist for medications management;Assistance with cooking/housework    Functional Status Assessment  Patient has had a recent decline in their functional status and demonstrates the ability to make significant improvements in function in a reasonable and predictable amount of time.  Equipment Recommendations  None recommended by OT    Recommendations for Other Services       Precautions / Restrictions Precautions Precautions: Fall Restrictions Weight Bearing Restrictions: No       Mobility Bed Mobility Overal bed mobility: Needs Assistance Bed Mobility: Sit to Supine     Supine to sit: Min assist     General bed mobility comments: for management of RLE    Transfers Overall transfer level: Needs assistance Equipment used: Rolling walker (2 wheels) Transfers: Sit to/from Stand Sit to Stand: Contact guard assist                  Balance Overall balance assessment: Needs assistance Sitting-balance support: No upper extremity supported, Feet supported Sitting balance-Leahy Scale: Good     Standing balance support: Bilateral upper extremity supported, During functional activity, Reliant on assistive device for balance Standing balance-Leahy Scale: Fair                             ADL either performed or assessed with clinical judgement   ADL Overall ADL's : Needs assistance/impaired Eating/Feeding: Set up;Sitting   Grooming: Wash/dry face;Sitting;Set up               Lower Body Dressing: Supervision/safety Lower Body Dressing Details (indicate cue type and reason): doff socks in bed Toilet Transfer: Contact guard assist;Rolling walker (2 wheels) Toilet Transfer Details (indicate cue type and reason): short amb transfer from bedside chair>bed, simulated                 Vision Patient Visual Report: No change from baseline       Perception         Praxis         Pertinent Vitals/Pain Pain Assessment Pain Assessment: Faces Faces Pain Scale: Hurts little more Pain Location: RLE  Pain Descriptors / Indicators: Discomfort Pain Intervention(s): Monitored during session, Repositioned, Premedicated before session     Extremity/Trunk Assessment Upper Extremity Assessment Upper Extremity Assessment: Overall WFL for tasks assessed   Lower Extremity Assessment Lower Extremity Assessment: Generalized weakness       Communication Communication Communication: Difficulty following  commands/understanding Following commands: Follows one step commands with increased time Cueing Techniques: Verbal cues;Tactile cues;Visual cues   Cognition Arousal: Alert   Overall Cognitive Status: No family/caregiver present to determine baseline cognitive functioning Area of Impairment: Following commands, Safety/judgement, Problem solving, Awareness, Memory, Attention, Orientation                 Orientation Level: Disoriented to, Time Current Attention Level: Sustained Memory: Decreased short-term memory, Decreased recall of precautions Following Commands: Follows one step commands with increased time Safety/Judgement: Decreased awareness of safety, Decreased awareness of deficits Awareness: Emergent Problem Solving: Slow processing, Requires verbal cues, Requires tactile cues General Comments: cognitive deficits at baseline     General Comments  RLE bandaged, intact pre/post session, vss throughout    Exercises Other Exercises Other Exercises: edu re: role of OT role of rehab, safe ADL completion   Shoulder Instructions      Home Living Family/patient expects to be discharged to:: Assisted living                                 Additional Comments: resident at [facility]      Prior Functioning/Environment Prior Level of Function : Other (comment)             Mobility Comments: Pt reports he transfers to mwc with supervision/sometimes MOD I, per TOC pt transfers himself to the mwc and then self propels ADLs Comments: Pt reports he has assist for all ADL/IADL as needed however is able to perform feeding, grooming, toilet transfer, UB dressing with supervision-SET UP; assist for LB dressing, bathing, toileting PRN, all IADLs        OT Problem List: Decreased strength;Impaired balance (sitting and/or standing);Decreased activity tolerance;Decreased knowledge of use of DME or AE      OT Treatment/Interventions: Self-care/ADL  training;Therapeutic exercise;Patient/family education;Balance training;Therapeutic activities;DME and/or AE instruction    OT Goals(Current goals can be found in the care plan section) Acute Rehab OT Goals Patient Stated Goal: go home OT Goal Formulation: With patient Time For Goal Achievement:  Potential to Achieve Goals: Good ADL Goals Pt Will Perform Grooming: sitting;with modified independence Pt Will Transfer to Toilet: with supervision Pt Will Perform Toileting - Clothing Manipulation and hygiene: with supervision  OT Frequency: Min 1X/week    Co-evaluation              AM-PAC OT "6 Clicks" Daily Activity     Outcome Measure Help from another person eating meals?: None Help from another person taking care of personal grooming?: None Help from another person toileting, which includes using toliet, bedpan, or urinal?: A Lot Help from another person bathing (including washing, rinsing, drying)?: A Lot Help from another person to put on and taking off regular upper body clothing?: A Little Help from another person to put on and taking off regular lower body clothing?: A Lot 6 Click Score: 17   End of Session Equipment Utilized During Treatment: Rolling walker (2 wheels);Gait belt Nurse Communication: Mobility status  Activity Tolerance: Patient tolerated treatment well Patient left: in bed;with call bell/phone within reach;with bed alarm set  OT  Visit Diagnosis: Other abnormalities of gait and mobility (R26.89)                Time: 1610-9604 OT Time Calculation (min): 13 min Charges:  OT General Charges $OT Visit: 1 Visit OT Evaluation $OT Eval Low Complexity: 1 Low  Oleta Mouse, OTD OTR/L  04/20/23, 1:52 PM

## 2023-04-20 NOTE — Progress Notes (Signed)
Physical Therapy Treatment Patient Details Name: Micheal Aguirre MRN: [id number] DOB: October 17, 1955 Today's Date: 04/20/2023   History of Present Illness 67 y.o. 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 He states that over the previous couple days he has been having weeping from the inside of his right lower leg evolving to severe pain and swelling of his entire right lower extremity.    PT Comments  Pt resting in bed upon PT arrival; agreeable to therapy.  Pt reporting 9/10 R LE pain (pt pre-medicated with pain medication for session).  During session pt SBA semi-supine to sitting EOB; CGA with transfer using RW (pt required 2 trials to stand); and CGA to ambulate a few feet bed to recliner with RW use.  Pt required extra time for activities during session (pt appearing to need to talk himself through activities and also specific on how he wanted things during session).  TOC updated on pt's progress.    If plan is discharge home, recommend the following: A little help with walking and/or transfers;A little help with bathing/dressing/bathroom;Assist for transportation;Help with stairs or ramp for entrance   Can travel by private vehicle        Equipment Recommendations  Rolling walker (2 wheels);Wheelchair (measurements PT) (pt reports having RW and w/c already)    Recommendations for Other Services       Precautions / Restrictions Precautions Precautions: Fall Restrictions Weight Bearing Restrictions: No     Mobility  Bed Mobility Overal bed mobility: Needs Assistance Bed Mobility: Supine to Sit     Supine to sit: Supervision, HOB  elevated     General bed mobility comments: increased time for pt to perform on own    Transfers Overall transfer level: Needs assistance Equipment used: Rolling walker (2 wheels) Transfers: Sit to/from Stand Sit to Stand: Contact guard assist           General transfer comment: pt unable to stand on own 1st attempt but able to stand up to RW 2nd attempt with CGA (from bed)    Ambulation/Gait Ambulation/Gait assistance: Contact guard assist Gait Distance (Feet): 3 Feet (bed to recliner) Assistive device: Rolling walker (2 wheels)   Gait velocity: decreased     General Gait Details: antalgic; decreased stance time R LE; steady with RW use   Stairs             Wheelchair Mobility     Tilt Bed    Modified Rankin (Stroke Patients Only)       Balance Overall balance assessment: Needs assistance Sitting-balance support: No upper extremity supported, Feet supported Sitting balance-Leahy Scale: Good Sitting balance - Comments: steady reaching within BOS   Standing balance support: Bilateral upper extremity supported, During functional activity, Reliant on assistive device for balance Standing balance-Leahy Scale: Good Standing balance comment: steady taking steps with RW use                            Cognition Arousal: Alert Behavior During Therapy: Anxious Overall Cognitive Status: History of cognitive impairments - at baseline  Exercises      General Comments General comments (skin integrity, edema, etc.): R LE weeping noted.  Nursing cleared pt for participation in physical therapy.  Pt agreeable to PT session.      Pertinent Vitals/Pain Pain Assessment Pain Assessment: 0-10 Pain Score: 9  Pain Location: R leg (distal to knee) Pain Descriptors / Indicators: Aching, Tender, Sore Pain Intervention(s): Limited activity within patient's tolerance, Monitored during session,  Premedicated before session, Repositioned Vitals (HR and SpO2 on room air) stable and WFL throughout treatment session.    Home Living                          Prior Function            PT Goals (current goals can now be found in the care plan section) Acute Rehab PT Goals Patient Stated Goal: control R LE pain PT Goal Formulation: With patient Time For Goal Achievement: 05/02/23 Potential to Achieve Goals: Fair Progress towards PT goals: Progressing toward goals    Frequency    Min 1X/week      PT Plan      Co-evaluation              AM-PAC PT "6 Clicks" Mobility   Outcome Measure  Help needed turning from your back to your side while in a flat bed without using bedrails?: A Little   Help needed moving to and from a bed to a chair (including a wheelchair)?: A Little Help needed standing up from a chair using your arms (e.g., wheelchair or bedside chair)?: A Little Help needed to walk in hospital room?: A Little Help needed climbing 3-5 steps with a railing? : Total 6 Click Score: 13    End of Session Equipment Utilized During Treatment: Gait belt Activity Tolerance: Patient limited by pain Patient left: in chair;with call bell/phone within reach;with chair alarm set Nurse Communication: Mobility status;Precautions (pt's pain status; R LE weeping) PT Visit Diagnosis: Muscle weakness (generalized) (M62.81);Difficulty in walking, not elsewhere classified (R26.2);Pain Pain - Right/Left: Right Pain - part of body: Leg     Time: 2993-7169 PT Time Calculation (min) (ACUTE ONLY): 23 min  Charges:    $Therapeutic Activity: 23-37 mins PT General Charges $$ ACUTE PT VISIT: 1 Visit                     Hendricks Limes, PT 04/20/23, 11:20 AM

## 2023-04-20 NOTE — Progress Notes (Signed)
[facility]  ROUNDING NOTE   Subjective:   [name] was admitted to [facility] on 04/17/2023 for Right leg pain [M79.604] Cellulitis of right leg [L03.115] Cellulitis of right lower extremity [L03.115]  Patient seen male in bed Partial completed breakfast tray at bedside Denies pain or discomfort  Large volume lower extremity edema noted  Objective:  Vital signs in last 24 hours:  Temp:  [98 F (36.7 C)-98.7 F (37.1 C)] 98.4 F (36.9 C) (08/29 1120) Pulse Rate:  [73-105] 75 (08/29 1142) Resp:  [11-21] 11 (08/29 1142) BP: (79-138)/(62-93) 138/91 (08/29 1142) SpO2:  [97 %-100 %] 100 % (08/29 1142) Weight:  [96.5 kg-98.5 kg] 97.1 kg (08/29 1143)  Weight change:  Filed Weights   04/19/23 1417 04/20/23 0358 04/20/23 1143  Weight: 96.5 kg 98.5 kg 97.1 kg    Intake/Output: I/O last 3 completed shifts: In: 540 [P.O.:240; IV Piggyback:300] Out: 1407 [Other:1407]   Intake/Output this shift:  No intake/output data recorded.  Physical Exam: General: NAD,   Head: Normocephalic, atraumatic. Moist oral mucosal membranes  Eyes: Anicteric  Lungs:  Clear to auscultation, normal effort  Heart: Regular rate and rhythm  Abdomen:  Soft, nontender  Extremities:  Right LE ++ weeping edema, erythema and induration  Neurologic: Alert, moving all four extremities  Skin: No lesions  Access: RIJ permcath    Basic Metabolic Panel: Recent Labs  Lab 04/17/23 0933 04/19/23 0907 04/20/23 0339  NA 138 135 136  K 3.8 3.7 3.7  CL 100 101 95*  CO2 21* 20* 25  GLUCOSE 70 158* 133*  BUN 44* 53* 37*  CREATININE 7.42* 6.76* 4.83*  CALCIUM 8.7* 8.3* 8.5*  PHOS  --  2.9  --     Liver Function Tests: Recent Labs  Lab 04/17/23 0933 04/19/23 0907  AST 117*  --   ALT 69*  --   ALKPHOS 146*  --   BILITOT 0.9  --   PROT 7.0  --   ALBUMIN 3.3* 2.7*   No results for input(s): "LIPASE", "AMYLASE" in the last 168 hours.  No results for input(s): "AMMONIA" in the last 168  hours.  CBC: Recent Labs  Lab 04/17/23 0933 04/18/23 0535 04/19/23 0907 04/20/23 0339  WBC 4.5 5.8 7.4 8.2  NEUTROABS 3.9  --   --   --   HGB 13.2 12.6* 12.1* 11.5*  HCT 40.9 38.8* 36.8* 34.2*  MCV 101.5* 100.8* 99.2 98.3  PLT 153 148* 126* 120*    Cardiac Enzymes: No results for input(s): "CKTOTAL", "CKMB", "CKMBINDEX", "TROPONINI" in the last 168 hours.  BNP: Invalid input(s): "POCBNP"  CBG: Recent Labs  Lab 04/19/23 0816 04/19/23 1615 04/19/23 2113 04/20/23 0814 04/20/23 1110  GLUCAP 132* 115* 139* 117* 142*    Microbiology: Results for orders placed or performed during the hospital encounter of 04/17/23  SARS Coronavirus 2 by RT PCR (hospital order, performed in [facility] hospital lab) *cepheid single result test* Anterior Nasal Swab     Status: None   Collection Time: 04/17/23 10:57 AM   Specimen: Anterior Nasal Swab  Result Value Ref Range Status   SARS Coronavirus 2 by RT PCR NEGATIVE NEGATIVE Final    Comment: (NOTE) SARS-CoV-2 target nucleic acids are NOT DETECTED.  The SARS-CoV-2 RNA is generally detectable in upper and lower respiratory specimens during the acute phase of infection. The lowest concentration of SARS-CoV-2 viral copies this assay can detect is 250 copies / mL. A negative result does not preclude SARS-CoV-2 infection  and should not be used as the sole basis for treatment or other patient management decisions.  A negative result may occur with improper specimen collection / handling, submission of specimen other than nasopharyngeal swab, presence of viral mutation(s) within the areas targeted by this assay, and inadequate number of viral copies (<250 copies / mL). A negative result must be combined with clinical observations, patient history, and epidemiological information.  Fact Sheet for Patients:   RoadLapTop.co.za  Fact Sheet for Healthcare Providers: http://kim-miller.com/  This  test is not yet approved or  cleared by the [place] FDA and has been authorized for detection and/or diagnosis of SARS-CoV-2 by FDA under an Emergency Use Authorization (EUA).  This EUA will remain in effect (meaning this test can be used) for the duration of the COVID-19 declaration under Section 564(b)(1) of the Act, 21 U.S.C. section 360bbb-3(b)(1), unless the authorization is terminated or revoked sooner.  Performed at [facility], [address] [place], [place] [address]   Blood culture (routine x 2)     Status: None (Preliminary result)   Collection Time: 04/17/23 10:57 AM   Specimen: BLOOD  Result Value Ref Range Status   Specimen Description BLOOD LARM  Final   Special Requests   Final    BOTTLES DRAWN AEROBIC AND ANAEROBIC Blood Culture results may not be optimal due to an inadequate volume of blood received in culture bottles   Culture   Final    NO GROWTH 3 DAYS Performed at [facility], [address], [place], [place] [address]    Report Status PENDING  Incomplete  Blood culture (routine x 2)     Status: None (Preliminary result)   Collection Time: 04/17/23 10:57 AM   Specimen: BLOOD  Result Value Ref Range Status   Specimen Description BLOOD RARM  Final   Special Requests   Final    BOTTLES DRAWN AEROBIC AND ANAEROBIC Blood Culture adequate volume   Culture   Final    NO GROWTH 3 DAYS Performed at [facility], [address], [place], [place] [address]    Report Status PENDING  Incomplete    Coagulation Studies: No results for input(s): "LABPROT", "INR" in the last 72 hours.  Urinalysis: No results for input(s): "COLORURINE", "LABSPEC", "PHURINE", "GLUCOSEU", "HGBUR", "BILIRUBINUR", "KETONESUR", "PROTEINUR", "UROBILINOGEN", "NITRITE", "LEUKOCYTESUR" in the last 72 hours.  Invalid input(s): "APPERANCEUR"    Imaging: No results found.   Medications:    cefTRIAXone (ROCEPHIN)  IV 2 g (04/19/23 1506)   vancomycin  1,000 mg (04/19/23 1555)    apixaban  2.5 mg Oral BID   atorvastatin  80 mg Oral QHS   bisacodyl  10 mg Oral Daily   calcium carbonate  500 mg Oral BID   Chlorhexidine Gluconate Cloth  6 each Topical Q0600   folic acid  1 mg Oral Daily   gabapentin  100 mg Oral QHS   insulin aspart  0-6 Units Subcutaneous TID WC   melatonin  5 mg Oral QHS   pantoprazole  40 mg Oral Q12H   polyethylene glycol  17 g Oral QHS   predniSONE  10 mg Oral Q breakfast   senna-docusate  2 tablet Oral BID   simethicone  80 mg Oral BID   sodium chloride flush  3 mL Intravenous Q12H   tamsulosin  0.4 mg Oral QHS   torsemide  60 mg Oral Daily   acetaminophen **OR** acetaminophen, ondansetron **OR** ondansetron (ZOFRAN) IV, oxyCODONE, polyethylene glycol  Assessment/  Plan:  Mr. Iden Massett is a 67 y.o.  male with end stage renal disease on hemodialysis, hypertension, diabetes mellitus type II, hyperlipidemia, atrial fibrillation, coronary artery disease status post CABG, peptic ulcer disease and prostate cancer who is admitted to [facility] on 04/17/2023 for Right leg pain [M79.604] Cellulitis of right leg [L03.115] Cellulitis of right lower extremity [L03.115]  [facility]  End Stage Renal Disease: requiring four days a week due to volume control - Dialysis received yesterday, 2L achieved.  - Will perform dialysis today to maintain 4 days a week treatment outpatient - Next treatment scheduled for Friday.   Hypertension with chronic kidney disease: hypotensive on encounter.  - restarted tamsulosin - holding home carvedilol, torsemide, and amlodipine.  - Blood pressure 118/88.  Anemia of chronic kidney disease: hemoglobin remains acceptable, 11.5. Holding ESA due to suspected malignancy.   Secondary Hyperparathyroidism: not currently phosphate binders. Calcium and phosphorus within desired range.   5. Cellulitis, right lower leg. Remains on antibiotics per primary team. Erythema improving, edema remains  present.    LOS: 2   8/29/202411:51 AM

## 2023-04-21 DIAGNOSIS — I5032 Chronic diastolic (congestive) heart failure: Secondary | ICD-10-CM | POA: Diagnosis not present

## 2023-04-21 DIAGNOSIS — L03115 Cellulitis of right lower limb: Secondary | ICD-10-CM | POA: Diagnosis not present

## 2023-04-21 DIAGNOSIS — N186 End stage renal disease: Secondary | ICD-10-CM | POA: Diagnosis not present

## 2023-04-21 DIAGNOSIS — C61 Malignant neoplasm of prostate: Secondary | ICD-10-CM | POA: Diagnosis not present

## 2023-04-21 LAB — RENAL FUNCTION PANEL
Albumin: 2.7 g/dL — ABNORMAL LOW (ref 3.5–5.0)
Anion gap: 19 — ABNORMAL HIGH (ref 5–15)
BUN: 35 mg/dL — ABNORMAL HIGH (ref 8–23)
CO2: 24 mmol/L (ref 22–32)
Calcium: 8.5 mg/dL — ABNORMAL LOW (ref 8.9–10.3)
Chloride: 93 mmol/L — ABNORMAL LOW (ref 98–111)
Creatinine, Ser: 4.09 mg/dL — ABNORMAL HIGH (ref 0.61–1.24)
GFR, Estimated: 15 mL/min — ABNORMAL LOW (ref >60)
Glucose, Bld: 111 mg/dL — ABNORMAL HIGH (ref 70–99)
Phosphorus: 3 mg/dL (ref 2.5–4.6)
Potassium: 3.8 mmol/L (ref 3.5–5.1)
Sodium: 136 mmol/L (ref 135–145)

## 2023-04-21 LAB — CBC
HCT: 36.6 % — ABNORMAL LOW (ref 39.0–52.0)
Hemoglobin: 12.1 g/dL — ABNORMAL LOW (ref 13.0–17.0)
MCH: 32.9 pg (ref 26.0–34.0)
MCHC: 33.1 g/dL (ref 30.0–36.0)
MCV: 99.5 fL (ref 80.0–100.0)
Platelets: 133 10*3/uL — ABNORMAL LOW (ref 150–400)
RBC: 3.68 MIL/uL — ABNORMAL LOW (ref 4.22–5.81)
RDW: 16 % — ABNORMAL HIGH (ref 11.5–15.5)
WBC: 10 10*3/uL (ref 4.0–10.5)
nRBC: 2.3 % — ABNORMAL HIGH (ref 0.0–0.2)

## 2023-04-21 LAB — GLUCOSE, CAPILLARY
Glucose-Capillary: 81 mg/dL (ref 70–99)
Glucose-Capillary: 103 mg/dL — ABNORMAL HIGH (ref 70–99)
Glucose-Capillary: 105 mg/dL — ABNORMAL HIGH (ref 70–99)

## 2023-04-21 MED ORDER — HEPARIN SODIUM (PORCINE) 1000 UNIT/ML IJ SOLN
INTRAMUSCULAR | Status: AC
  Filled 2023-04-21: qty 10

## 2023-04-21 MED ORDER — ONDANSETRON HCL 4 MG/2ML IJ SOLN
INTRAMUSCULAR | Status: AC
  Filled 2023-04-21: qty 2

## 2023-04-21 MED ORDER — METOCLOPRAMIDE HCL 5 MG/ML IJ SOLN
5.0000 mg | Freq: Once | INTRAMUSCULAR | Status: AC
  Administered 2023-04-21: 5 mg via INTRAVENOUS
  Filled 2023-04-21: qty 2

## 2023-04-21 MED ORDER — SIMETHICONE 80 MG PO CHEW
80.0000 mg | CHEWABLE_TABLET | Freq: Four times a day (QID) | ORAL | Status: DC | PRN
  Administered 2023-04-24: 80 mg via ORAL
  Filled 2023-04-21: qty 1

## 2023-04-21 MED ORDER — CARVEDILOL 12.5 MG PO TABS
12.5000 mg | ORAL_TABLET | Freq: Two times a day (BID) | ORAL | Status: DC
  Administered 2023-04-22 (×2): 12.5 mg via ORAL
  Filled 2023-04-21 (×3): qty 1

## 2023-04-21 NOTE — Care Management Important Message (Signed)
Important Message  Patient Details  Name: Micheal Aguirre MRN: [id number] Date of Birth: November 25, 1955   Medicare Important Message Given:  N/A - LOS <3 / Initial given by admissions     Johnell Comings 04/21/2023, 8:14 AM

## 2023-04-21 NOTE — Progress Notes (Signed)
Hemodialysis Note  Received patient in bed to unit. Alert and oriented. Informed consent signed and in chart.   Treatment initiated:0836 Treatment completed:1236  Patient with concerns of why in hospital and  why on dialysis. Patient medicated for nausea. Transported back to the room alert, without acute distress. Report given to patient's RN.  Access used: Right Subclavian  Access issues: None   Total UF removed: 590.2 ml  Medications given: Heparin, Zofran Post HD VS: Stable  Post HD weight: 92.4 kg Bed scale    Bartolo Darter, RN  [facility]

## 2023-04-21 NOTE — Progress Notes (Signed)
[facility]  ROUNDING NOTE   Subjective:   [name] was admitted to [facility] on 04/17/2023 for Right leg pain [M79.604] Cellulitis of right leg [L03.115] Cellulitis of right lower extremity [L03.115]  Patient seen and evaluated during dialysis   HEMODIALYSIS FLOWSHEET:  Blood Flow Rate (mL/min): 399 mL/min Arterial Pressure (mmHg): -243.22 mmHg Venous Pressure (mmHg): 202.62 mmHg TMP (mmHg): -5.66 mmHg Ultrafiltration Rate (mL/min): 0 mL/min Dialysate Flow Rate (mL/min): 299 ml/min Dialysis Fluid Bolus: Normal Saline Bolus Amount (mL): 100 mL  More confused today Knows where he lives, but disoriented to place and time   Objective:  Vital signs in last 24 hours:  Temp:  [98.2 F (36.8 C)-98.6 F (37 C)] 98.6 F (37 C) (08/30 0829) Pulse Rate:  [47-108] 104 (08/30 1130) Resp:  [11-28] 15 (08/30 1130) BP: (95-137)/(67-106) 106/67 (08/30 1130) SpO2:  [93 %-100 %] 100 % (08/30 1130) Weight:  [93 kg-96.6 kg] 93 kg (08/30 0815)  Weight change: -0.9 kg Filed Weights   04/20/23 1143 04/21/23 0500 04/21/23 0815  Weight: 97.1 kg 96.6 kg 93 kg    Intake/Output: I/O last 3 completed shifts: In: 360 [P.O.:360] Out: 2034.8 [Other:2034.8]   Intake/Output this shift:  No intake/output data recorded.  Physical Exam: General: NAD,   Head: Normocephalic, atraumatic. Moist oral mucosal membranes  Eyes: Anicteric  Lungs:  Clear to auscultation, normal effort  Heart: Regular rate and rhythm  Abdomen:  Soft, nontender  Extremities:  Right LE ++ weeping edema, erythema and induration, scrotal edema  Neurologic: Alert, moving all four extremities  Skin: No lesions  Access: RIJ permcath    Basic Metabolic Panel: Recent Labs  Lab 04/17/23 0933 04/19/23 0907 04/20/23 0339 04/21/23 0850  NA 138 135 136 136  K 3.8 3.7 3.7 3.8  CL 100 101 95* 93*  CO2 21* 20* 25 24  GLUCOSE 70 158* 133* 111*  BUN 44* 53* 37* 35*  CREATININE 7.42* 6.76* 4.83* 4.09*  CALCIUM  8.7* 8.3* 8.5* 8.5*  PHOS  --  2.9  --  3.0    Liver Function Tests: Recent Labs  Lab 04/17/23 0933 04/19/23 0907 04/21/23 0850  AST 117*  --   --   ALT 69*  --   --   ALKPHOS 146*  --   --   BILITOT 0.9  --   --   PROT 7.0  --   --   ALBUMIN 3.3* 2.7* 2.7*   No results for input(s): "LIPASE", "AMYLASE" in the last 168 hours.  No results for input(s): "AMMONIA" in the last 168 hours.  CBC: Recent Labs  Lab 04/17/23 0933 04/18/23 0535 04/19/23 0907 04/20/23 0339 04/21/23 0850  WBC 4.5 5.8 7.4 8.2 10.0  NEUTROABS 3.9  --   --   --   --   HGB 13.2 12.6* 12.1* 11.5* 12.1*  HCT 40.9 38.8* 36.8* 34.2* 36.6*  MCV 101.5* 100.8* 99.2 98.3 99.5  PLT 153 148* 126* 120* 133*    Cardiac Enzymes: No results for input(s): "CKTOTAL", "CKMB", "CKMBINDEX", "TROPONINI" in the last 168 hours.  BNP: Invalid input(s): "POCBNP"  CBG: Recent Labs  Lab 04/19/23 2113 04/20/23 0814 04/20/23 1110 04/20/23 1617 04/20/23 2122  GLUCAP 139* 117* 142* 91 98    Microbiology: Results for orders placed or performed during the hospital encounter of 04/17/23  SARS Coronavirus 2 by RT PCR (hospital order, performed in [facility] hospital lab) *cepheid single result test* Anterior Nasal Swab     Status: None  Collection Time: 04/17/23 10:57 AM   Specimen: Anterior Nasal Swab  Result Value Ref Range Status   SARS Coronavirus 2 by RT PCR NEGATIVE NEGATIVE Final    Comment: (NOTE) SARS-CoV-2 target nucleic acids are NOT DETECTED.  The SARS-CoV-2 RNA is generally detectable in upper and lower respiratory specimens during the acute phase of infection. The lowest concentration of SARS-CoV-2 viral copies this assay can detect is 250 copies / mL. A negative result does not preclude SARS-CoV-2 infection and should not be used as the sole basis for treatment or other patient management decisions.  A negative result may occur with improper specimen collection / handling, submission of specimen  other than nasopharyngeal swab, presence of viral mutation(s) within the areas targeted by this assay, and inadequate number of viral copies (<250 copies / mL). A negative result must be combined with clinical observations, patient history, and epidemiological information.  Fact Sheet for Patients:   RoadLapTop.co.za  Fact Sheet for Healthcare Providers: http://kim-miller.com/  This test is not yet approved or  cleared by the [place] FDA and has been authorized for detection and/or diagnosis of SARS-CoV-2 by FDA under an Emergency Use Authorization (EUA).  This EUA will remain in effect (meaning this test can be used) for the duration of the COVID-19 declaration under Section 564(b)(1) of the Act, 21 U.S.C. section 360bbb-3(b)(1), unless the authorization is terminated or revoked sooner.  Performed at [facility], [address] [place], [place] [address]   Blood culture (routine x 2)     Status: None (Preliminary result)   Collection Time: 04/17/23 10:57 AM   Specimen: BLOOD  Result Value Ref Range Status   Specimen Description BLOOD LARM  Final   Special Requests   Final    BOTTLES DRAWN AEROBIC AND ANAEROBIC Blood Culture results may not be optimal due to an inadequate volume of blood received in culture bottles   Culture   Final    NO GROWTH 4 DAYS Performed at [facility], [address], [place], [place] [address]    Report Status PENDING  Incomplete  Blood culture (routine x 2)     Status: None (Preliminary result)   Collection Time: 04/17/23 10:57 AM   Specimen: BLOOD  Result Value Ref Range Status   Specimen Description BLOOD RARM  Final   Special Requests   Final    BOTTLES DRAWN AEROBIC AND ANAEROBIC Blood Culture adequate volume   Culture   Final    NO GROWTH 4 DAYS Performed at [facility], [address], [place], [place] [address]    Report Status PENDING  Incomplete     Coagulation Studies: No results for input(s): "LABPROT", "INR" in the last 72 hours.  Urinalysis: No results for input(s): "COLORURINE", "LABSPEC", "PHURINE", "GLUCOSEU", "HGBUR", "BILIRUBINUR", "KETONESUR", "PROTEINUR", "UROBILINOGEN", "NITRITE", "LEUKOCYTESUR" in the last 72 hours.  Invalid input(s): "APPERANCEUR"    Imaging: No results found.   Medications:      apixaban  2.5 mg Oral BID   atorvastatin  80 mg Oral QHS   bisacodyl  10 mg Oral Daily   calcium carbonate  500 mg Oral BID   Chlorhexidine Gluconate Cloth  6 each Topical Q0600   folic acid  1 mg Oral Daily   gabapentin  100 mg Oral QHS   insulin aspart  0-6 Units Subcutaneous TID WC   melatonin  5 mg Oral QHS   pantoprazole  40 mg Oral Q12H   polyethylene glycol  17 g Oral  QHS   predniSONE  10 mg Oral Q breakfast   senna-docusate  2 tablet Oral BID   simethicone  80 mg Oral BID   sodium chloride flush  3 mL Intravenous Q12H   tamsulosin  0.4 mg Oral QHS   torsemide  60 mg Oral Daily   acetaminophen **OR** acetaminophen, ondansetron **OR** ondansetron (ZOFRAN) IV, oxyCODONE, polyethylene glycol  Assessment/ Plan:  [name] is a 67 y.o.  male with end stage renal disease on hemodialysis, hypertension, diabetes mellitus type II, hyperlipidemia, atrial fibrillation, coronary artery disease status post CABG, peptic ulcer disease and prostate cancer who is admitted to [facility] on 04/17/2023 for Right leg pain [M79.604] Cellulitis of right leg [L03.115] Cellulitis of right lower extremity [L03.115]  [facility]  End Stage Renal Disease: requiring four days a week due to volume control - Receiving treatment today, UF goal 2L as tolerated. Next treatment scheduled for Monday.   Hypertension with chronic kidney disease: hypotensive on encounter.  - restarted tamsulosin - holding home carvedilol, torsemide, and amlodipine.  - Blood pressure 95/64 during dialysis  Anemia of chronic kidney disease:  hemoglobin acceptable, 12.1. Holding ESA due to suspected malignancy.   Secondary Hyperparathyroidism: not currently phosphate binders. Calcium and phosphorus at goal  5. Cellulitis, right lower leg. Completed antibiotics. Edema remains present   LOS: 3   8/30/202412:20 PM

## 2023-04-21 NOTE — Progress Notes (Signed)
Patient returned from dialysis not feeling well. Patient observed to have one episode of vomiting. VS and CBG normal. Patient did not eat breakfast this morning and is also refusing to lunch. Patient also seems a bit confused. MD notified.   Madie Reno, RN

## 2023-04-21 NOTE — Progress Notes (Addendum)
PROGRESS NOTE    Micheal Aguirre  ZOX:[id number] DOB: 19-Jan-1956 DOA: 04/17/2023 PCP: Almetta Lovely, [facility]    Brief Narrative:    Micheal Aguirre is a 67 y.o. 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  In the ED, patient was started on IV cefepime vancomycin and Flagyl and was admitted hospital for right leg cellulitis.  At this time physical therapy has recommend home health PT on discharge but facility requesting rehabilitation prior to discharge.  TOC on disposition to skilled nursing facility  Assessment and plan.  Cellulitis of right leg Mild erythema but edema noted.  Currently under dressing..  No evidence of sepsis.  Patient received IV Rocephin and vancomycin.  Blood cultures negative so far.  Continue oxycodone for pain, recommend elevation of the leg.  Will discontinue vancomycin and continue Rocephin to complete 7-day course.  Will change to oral antibiotic on discharge.  ESRD on dialysis  Undergoing hemodialysis as per nephrology.  Undergoes 4 days a week.  Seen during hemodialysis.  Abdominal discomfort bloating nausea.  Continue with PPI twice daily, on Tums.  Will add simethicone.  Focus on bowel regimen.  On Senokot, Dulcolax suppository and MiraLAX.  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 Continue outpatient follow-up with oncology  Chronic diastolic CHF (congestive heart failure)  On hemodialysis for fluid management.  Compensated  HTN (hypertension) Labile  hypertension..  Blood pressure is stable at this time.  On amlodipine at home.  Antihypertensives on hold at this time.  Latest blood pressure of 110/86  Elevated LFTs Likely secondary to metastatic liver lesion.  Follow-up as outpatient.  Hepatitis B surface antigen nonreactive.  Check CMP in AM.  Type II diabetes mellitus with renal manifestations (HCC) On sliding scale insulin during hospitalization with optimal control..  Continue diabetic diet.  Patient is on Lantus 8 units at home with NovoLog with meals.  Latest POC glucose of 98.  Atrial fibrillation, chronic (HCC) Continue Eliquis for anticoagulation.  On Coreg 25 mg twice daily at home.  Currently on hold but will restart Coreg from today at 12.5 twice daily.  Deconditioning, debility.  Continue physical therapy while in the hospital.  Plan for skilled nursing facility placement.    DVT prophylaxis: apixaban (ELIQUIS) tablet 2.5 mg Start: 04/17/23 2200 apixaban (ELIQUIS) tablet 2.5 mg   Code Status:     Code Status: Full Code  Disposition: Patient's daughter wishes to ALF with home health.  Checked with TOC and have been told that ALF recommends rehab.  Patient's daughter wishes to discuss this with the [facility] team.  [facility] team notified.  Medically stable for disposition.  Status is: Inpatient  Remains inpatient appropriate because: need for skilled nursing facility placement.   Family Communication:   I spoke with the patient's daughter on the phone and updated her about the clinical condition of the patient.  She wished to speak with the social worker regarding disposition.  Consultants:  Nephrology  Procedures:  Hemodialysis  Antimicrobials:  Rocephin IV  Anti-infectives (From admission, onward)    Start  Dose/Rate Route Frequency Ordered Stop   04/19/23 1200  vancomycin (VANCOCIN) IVPB 1000 mg/200 mL premix  Status:  Discontinued        1,000 mg 200 mL/hr over 60 Minutes Intravenous Every M-W-F (Hemodialysis)  04/18/23 1216 04/20/23 1256   04/18/23 1200  cefTRIAXone (ROCEPHIN) 2 g in sodium chloride 0.9 % 100 mL IVPB  Status:  Discontinued        2 g 200 mL/hr over 30 Minutes Intravenous Every 24 hours 04/17/23 1347 04/20/23 1256   04/17/23 2200  metroNIDAZOLE (FLAGYL) IVPB 500 mg  Status:  Discontinued        500 mg 100 mL/hr over 60 Minutes Intravenous Every 8 hours 04/17/23 1347 04/17/23 1348   04/17/23 1900  vancomycin (VANCOCIN) IVPB 1000 mg/200 mL premix        1,000 mg 200 mL/hr over 60 Minutes Intravenous  Once 04/17/23 1652 04/17/23 2047   04/17/23 1245  vancomycin (VANCOREADY) IVPB 2000 mg/400 mL        2,000 mg 200 mL/hr over 120 Minutes Intravenous  Once 04/17/23 1234 04/17/23 2048   04/17/23 1230  ceFEPIme (MAXIPIME) 2 g in sodium chloride 0.9 % 100 mL IVPB        2 g 200 mL/hr over 30 Minutes Intravenous  Once 04/17/23 1217 04/17/23 1315   04/17/23 1230  metroNIDAZOLE (FLAGYL) IVPB 500 mg        500 mg 100 mL/hr over 60 Minutes Intravenous  Once 04/17/23 1217 04/17/23 1441   04/17/23 1230  vancomycin (VANCOCIN) IVPB 1000 mg/200 mL premix  Status:  Discontinued        1,000 mg 200 mL/hr over 60 Minutes Intravenous  Once 04/17/23 1217 04/17/23 1234      Subjective:  Today, patient was seen and examined at bedside.  Seen during hemodialysis.  Complains of not feeling well with weakness fatigue, nausea and gaseous feeling.  Has not had a bowel movement in 2 days.   Objective: Vitals:   04/21/23 1030 04/21/23 1130 04/21/23 1230 04/21/23 1233  BP: 105/69 106/67 129/79   Pulse: 81 (!) 104 67 88  Resp: 12 15 18 20   Temp:    98 F (36.7 C)  TempSrc:    Oral  SpO2: 100% 100% 100% 100%  Weight:    92.4 kg  Height:        Intake/Output Summary (Last 24 hours) at 04/21/2023 1256 Last data filed at 04/21/2023 1233 Gross per 24 hour  Intake 120 ml  Output 2634.77 ml  Net -2514.77 ml   Filed Weights   04/21/23 0500 04/21/23 0815 04/21/23 1233  Weight: 96.6 kg 93 kg 92.4 kg     Physical Examination: Body mass index is 30.08 kg/m.   General: Obese built, appears deconditioned, alert awake Communicative. HENT:   No scleral pallor or icterus noted. Oral mucosa is moist.  Chest:   Diminished breath sounds bilaterally.   Right chest wall dialysis catheter in place. CVS: S1 &S2 heard. No murmur.  Regular rate and rhythm. Abdomen: Soft, nontender, nondistended.  Bowel sounds are heard.   Extremities: No cyanosis, clubbing . Right lower extremity with erythema and edema covered with dressing.  Erythema has improved. Psych: Alert, awake and oriented, anxious mood, CNS:  No cranial nerve deficits.  Moves all extremities. Skin: Warm and dry.  Right lower extremity erythema edema covered with dressing.  Data Reviewed:   CBC: Recent Labs  Lab 04/17/23 0933 04/18/23 0535 04/19/23 2130 04/20/23 8657 04/21/23 8469  WBC 4.5 5.8 7.4 8.2 10.0  NEUTROABS 3.9  --   --   --   --   HGB 13.2 12.6* 12.1* 11.5* 12.1*  HCT 40.9 38.8* 36.8* 34.2* 36.6*  MCV 101.5* 100.8* 99.2 98.3 99.5  PLT 153 148* 126* 120* 133*    Basic Metabolic Panel: Recent Labs  Lab 04/17/23 0933 04/19/23 0907 04/20/23 0339 04/21/23 0850  NA 138 135 136 136  K 3.8 3.7 3.7 3.8  CL 100 101 95* 93*  CO2 21* 20* 25 24  GLUCOSE 70 158* 133* 111*  BUN 44* 53* 37* 35*  CREATININE 7.42* 6.76* 4.83* 4.09*  CALCIUM 8.7* 8.3* 8.5* 8.5*  PHOS  --  2.9  --  3.0    Liver Function Tests: Recent Labs  Lab 04/17/23 0933 04/19/23 0907 04/21/23 0850  AST 117*  --   --   ALT 69*  --   --   ALKPHOS 146*  --   --   BILITOT 0.9  --   --   PROT 7.0  --   --   ALBUMIN 3.3* 2.7* 2.7*     Radiology Studies: No results found.    LOS: 3 days    Joycelyn Das, MD [facility] Available via Epic secure chat 7am-7pm After these hours, please refer to coverage provider listed on [email or web address] 04/21/2023, 12:56 PM

## 2023-04-21 NOTE — Plan of Care (Signed)
  Problem: Coping: Goal: Ability to adjust to condition or change in health will improve Outcome: Progressing   Problem: Fluid Volume: Goal: Ability to maintain a balanced intake and output will improve Outcome: Progressing   Problem: Health Behavior/Discharge Planning: Goal: Ability to manage health-related needs will improve Outcome: Progressing   Problem: Metabolic: Goal: Ability to maintain appropriate glucose levels will improve Outcome: Progressing   

## 2023-04-21 NOTE — TOC Progression Note (Addendum)
Transition of Care [facility]) - Progression Note    Patient Details  Name: Micheal Aguirre MRN: [id number] Date of Birth: 10-25-1955  Transition of Care [facility]) CM/SW Contact  Margarito Liner, LCSW Phone Number: 04/21/2023, 9:33 AM  Clinical Narrative:  Will review SNF bed offers with patient when he returns from HD.   2:28 pm: Per RN, patient returned from HD with confusion. Per flowsheets, he is typically AOx4. Will hold off on giving bed offers.  3:50 pm: Daughter does not want SNF placement. She would prefer for patient to return to ALF with home health. CSW spoke to [facility] and Librarian, academic who need to talk with their regional team. CSW called and updated daughter after conversation. She said [facility] had called her about home health services. CSW called and confirmed they can come out on the weekends and work with him after HD if he is feeling well enough.   4:14 pm: After speaking with ALF, daughter is agreeable to SNF if the hospital recommends it and insurance approves. CSW updated MD and PT/OT. Reviewed bed offers. [facility] would not be able to accept him until Tuesday but could do HD at their facility. Sent referral to [facility] per daughter request. CSW left message for admissions coordinator asking her to review.  4:42 pm: [facility] will not be able to review referral until Monday but confirmed they do transport to HD in [place].  Expected Discharge Plan and Services                                               Social Determinants of Health (SDOH) Interventions SDOH Screenings   Food Insecurity: No Food Insecurity (04/17/2023)  Housing: Low Risk  (04/17/2023)  Transportation Needs: No Transportation Needs (04/17/2023)  Utilities: Not At Risk (04/17/2023)  Tobacco Use: Medium Risk (04/17/2023)    Readmission Risk Interventions    04/20/2023   11:32 AM  Readmission Risk Prevention Plan  Transportation Screening Complete  Medication Review (RN Care  Manager) Complete  HRI or Home Care Consult Complete  SW Recovery Care/Counseling Consult Complete  Palliative Care Screening Complete  Skilled Nursing Facility Not Applicable

## 2023-04-21 NOTE — Plan of Care (Signed)

## 2023-04-22 DIAGNOSIS — N186 End stage renal disease: Secondary | ICD-10-CM | POA: Diagnosis not present

## 2023-04-22 DIAGNOSIS — L03115 Cellulitis of right lower limb: Secondary | ICD-10-CM | POA: Diagnosis not present

## 2023-04-22 DIAGNOSIS — I5032 Chronic diastolic (congestive) heart failure: Secondary | ICD-10-CM | POA: Diagnosis not present

## 2023-04-22 DIAGNOSIS — C61 Malignant neoplasm of prostate: Secondary | ICD-10-CM | POA: Diagnosis not present

## 2023-04-22 LAB — COMPREHENSIVE METABOLIC PANEL
ALT: 274 U/L — ABNORMAL HIGH (ref 0–44)
AST: 188 U/L — ABNORMAL HIGH (ref 15–41)
Albumin: 2.6 g/dL — ABNORMAL LOW (ref 3.5–5.0)
Alkaline Phosphatase: 219 U/L — ABNORMAL HIGH (ref 38–126)
Anion gap: 15 (ref 5–15)
BUN: 32 mg/dL — ABNORMAL HIGH (ref 8–23)
CO2: 24 mmol/L (ref 22–32)
Calcium: 8.3 mg/dL — ABNORMAL LOW (ref 8.9–10.3)
Chloride: 96 mmol/L — ABNORMAL LOW (ref 98–111)
Creatinine, Ser: 3.39 mg/dL — ABNORMAL HIGH (ref 0.61–1.24)
GFR, Estimated: 19 mL/min — ABNORMAL LOW (ref >60)
Glucose, Bld: 158 mg/dL — ABNORMAL HIGH (ref 70–99)
Potassium: 3.7 mmol/L (ref 3.5–5.1)
Sodium: 135 mmol/L (ref 135–145)
Total Bilirubin: 1.2 mg/dL (ref 0.3–1.2)
Total Protein: 6.5 g/dL (ref 6.5–8.1)

## 2023-04-22 LAB — CBC
HCT: 34.9 % — ABNORMAL LOW (ref 39.0–52.0)
Hemoglobin: 11.8 g/dL — ABNORMAL LOW (ref 13.0–17.0)
MCH: 32.7 pg (ref 26.0–34.0)
MCHC: 33.8 g/dL (ref 30.0–36.0)
MCV: 96.7 fL (ref 80.0–100.0)
Platelets: 109 10*3/uL — ABNORMAL LOW (ref 150–400)
RBC: 3.61 MIL/uL — ABNORMAL LOW (ref 4.22–5.81)
RDW: 15.8 % — ABNORMAL HIGH (ref 11.5–15.5)
WBC: 8.2 10*3/uL (ref 4.0–10.5)
nRBC: 2 % — ABNORMAL HIGH (ref 0.0–0.2)

## 2023-04-22 LAB — GLUCOSE, CAPILLARY
Glucose-Capillary: 123 mg/dL — ABNORMAL HIGH (ref 70–99)
Glucose-Capillary: 178 mg/dL — ABNORMAL HIGH (ref 70–99)
Glucose-Capillary: 186 mg/dL — ABNORMAL HIGH (ref 70–99)
Glucose-Capillary: 192 mg/dL — ABNORMAL HIGH (ref 70–99)

## 2023-04-22 LAB — CULTURE, BLOOD (ROUTINE X 2)
Culture: NO GROWTH
Culture: NO GROWTH
Special Requests: ADEQUATE

## 2023-04-22 LAB — MRSA NEXT GEN BY PCR, NASAL: MRSA by PCR Next Gen: NOT DETECTED

## 2023-04-22 LAB — MAGNESIUM: Magnesium: 2 mg/dL (ref 1.7–2.4)

## 2023-04-22 MED ORDER — OXYCODONE HCL 5 MG PO TABS
5.0000 mg | ORAL_TABLET | ORAL | Status: DC | PRN
  Administered 2023-04-22 – 2023-04-25 (×9): 5 mg via ORAL
  Filled 2023-04-22 (×9): qty 1

## 2023-04-22 MED ORDER — SODIUM CHLORIDE 0.9 % IV SOLN
2.0000 g | INTRAVENOUS | Status: AC
  Administered 2023-04-22 – 2023-04-24 (×3): 2 g via INTRAVENOUS
  Filled 2023-04-22 (×4): qty 20

## 2023-04-22 NOTE — Progress Notes (Signed)
Occupational Therapy Treatment Patient Details Name: Tracie Suchecki MRN: [id number] DOB: 1955/12/21 Today's Date: 04/22/2023   History of present illness Pt is a 67 year old male presenting to the ED from ALF with c/o R leg pain from the knee down, admitted with R leg cellulitis.     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 Sheffler demonstrated good progress today, with better orientation, reduced cognitive dysfunction. He made good effort during therapy session, insisting on trying all standing, dressing, eating tasks on his own or with a minimal level of assistance from therapist. He does need close SUPV for safety and Min-Mod A for OOB movements. Provided educ re: DC recs, with pt stating that he believes he could benefit from a higher level of rehab care, to improve his strength, endurance, and balance, prior to returning to his ALF.      If plan is discharge home, recommend the following:  A lot of help with walking and/or transfers;Direct supervision/assist for financial management;Help with stairs or ramp for entrance;Supervision due to cognitive status;Assist for transportation;Direct supervision/assist for medications management;Assistance with cooking/housework;A little help with bathing/dressing/bathroom   Equipment Recommendations  None recommended by OT    Recommendations for Other Services      Precautions / Restrictions Precautions Precautions: Fall Restrictions Weight Bearing Restrictions: No       Mobility Bed Mobility               General bed mobility comments: Pt received sitting EOB    Transfers Overall transfer level: Needs assistance Equipment used: Rolling walker (2 wheels) Transfers: Sit  to/from Stand, Bed to chair/wheelchair/BSC Sit to Stand: From elevated surface, Min assist     Step pivot transfers: Mod assist           Balance Overall balance assessment: Needs assistance Sitting-balance support: No upper extremity supported, Feet supported Sitting balance-Leahy Scale: Good     Standing balance support: Bilateral upper extremity supported, During functional activity, Reliant on assistive device for balance, Single extremity supported Standing balance-Leahy Scale: Fair Standing balance comment: steady taking steps with RW use and with pulling up underwear in standing, w/ one-handed support on RW                           ADL either performed or assessed with clinical judgement   ADL Overall ADL's : Needs assistance/impaired Eating/Feeding: Set up;Sitting                   Lower Body Dressing: Minimal assistance;Sit to/from stand Lower Body Dressing Details (indicate cue type and reason): donning mesh underwear                    Extremity/Trunk Assessment Upper Extremity Assessment Upper Extremity Assessment: Overall WFL for tasks assessed   Lower Extremity Assessment Lower Extremity Assessment: Generalized weakness        Vision       Perception     Praxis      Cognition Arousal: Alert   Overall Cognitive Status: History of cognitive impairments - at baseline  Exercises Other Exercises Other Exercises: Educ re: DC recs, with pt stating he believes he would benefit from STR    Shoulder Instructions       General Comments R Lower leg dressing intact. Shoe cover applied on R foot prior to standing    Pertinent Vitals/ Pain       Pain Assessment Pain Assessment: Faces Faces Pain Scale: Hurts even more Pain Location: RLE, especially with touch Pain Descriptors / Indicators: Sharp Pain Intervention(s): Repositioned, Limited activity within patient's  tolerance  Home Living                                          Prior Functioning/Environment              Frequency  Min 1X/week        Progress Toward Goals  OT Goals(current goals can now be found in the care plan section)        Plan      Co-evaluation                 AM-PAC OT "6 Clicks" Daily Activity     Outcome Measure   Help from another person eating meals?: None Help from another person taking care of personal grooming?: A Little Help from another person toileting, which includes using toliet, bedpan, or urinal?: A Lot Help from another person bathing (including washing, rinsing, drying)?: A Lot Help from another person to put on and taking off regular upper body clothing?: A Little Help from another person to put on and taking off regular lower body clothing?: A Lot 6 Click Score: 16    End of Session Equipment Utilized During Treatment: Rolling walker (2 wheels)  OT Visit Diagnosis: Other abnormalities of gait and mobility (R26.89);Muscle weakness (generalized) (M62.81);Unsteadiness on feet (R26.81)   Activity Tolerance Patient tolerated treatment well   Patient Left in chair;with call bell/phone within reach;with chair alarm set   Nurse Communication          Time: 7846-9629 OT Time Calculation (min): 11 min  Charges: OT General Charges $OT Visit: 1 Visit OT Treatments $Self Care/Home Management : 8-22 mins  Latina Craver, PhD, MS, OTR/L 04/22/23, 4:26 PM

## 2023-04-22 NOTE — Progress Notes (Signed)
Physical Therapy Treatment Patient Details Name: Micheal Aguirre MRN: [id number] DOB: 02-11-1956 Today's Date: 04/22/2023   History of Present Illness Pt is a 67 year old male presenting to the ED from ALF with c/o R leg pain from the knee down, admitted with R leg cellulitis.     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 Therapist in to see pt who agreed to PT session. R lower leg with gauze dressing intact. Pt able to transfer to EOB with HOB raised and MinA. No dizziness sitting EOB. Increased time to complete desired tasks due to pt struggling to maintain focus and becoming distracted. Cues needed for proper technique to stand from bed to RW with ModA. Pt required close CGA to advance 103ft to bedside chair. Heavy reliance on RW to offload R LE discomfort. Pt positioned to comfort in chair for hand off to OT. Pt does appear to be clearing cognitively, however does not appear at functional baseline to safely return to ALF. Possible short term rehab prior to returning to [facility] may be beneficial and a safer plan for Pt.   If plan is discharge home, recommend the following: A little help with walking and/or transfers;A little help with bathing/dressing/bathroom;Assist for transportation;Help with stairs or ramp for entrance   Can travel by private vehicle     No  Equipment Recommendations  None recommended by PT (TBD at next level of care)    Recommendations for Other Services       Precautions / Restrictions Precautions Precautions: Fall Restrictions Weight Bearing Restrictions: No     Mobility  Bed Mobility Overal bed mobility: Needs  Assistance Bed Mobility: Supine to Sit     Supine to sit: Min assist, HOB elevated     General bed mobility comments:  (Increased time needed due to attention span)    Transfers Overall transfer level: Needs assistance Equipment used: Rolling walker (2 wheels) Transfers: Sit to/from Stand Sit to Stand: Mod assist, From elevated surface           General transfer comment:  (Repeated cues for proper technique)    Ambulation/Gait Ambulation/Gait assistance: Contact guard assist Gait Distance (Feet): 3 Feet Assistive device: Rolling walker (2 wheels) Gait Pattern/deviations: Step-to pattern, Decreased weight shift to right, Antalgic Gait velocity: decreased     General Gait Details: Unable to tolerate progressive gait due to R lower leg pain   Stairs             Wheelchair Mobility     Tilt Bed    Modified Rankin (Stroke Patients Only)       Balance Overall balance assessment: Needs assistance Sitting-balance support: No upper extremity supported, Feet supported Sitting balance-Leahy Scale: Good     Standing balance support: Bilateral upper extremity supported, During functional activity, Reliant on assistive device for balance Standing balance-Leahy Scale: Fair Standing balance comment: steady taking steps with RW use                            Cognition Arousal: Alert Behavior During Therapy: WFL for tasks assessed/performed Overall Cognitive Status: History of cognitive impairments - at baseline (per  daughter) Area of Impairment: Following commands, Safety/judgement, Awareness, Memory                 Orientation Level: Disoriented to, Time, Situation Current Attention Level: Sustained Memory: Decreased short-term memory, Decreased recall of precautions Following Commands: Follows one step commands with increased time Safety/Judgement: Decreased awareness of safety, Decreased awareness of deficits Awareness: Emergent Problem  Solving: Slow processing, Requires verbal cues, Requires tactile cues General Comments: cognitive deficits at baseline        Exercises Other Exercises Other Exercises: Pt educated on role of PT. Discussed current LOF and functional mobility needed to safely return to previous ALF. Spoke with pt's daughter over the phone who states pt does not have much help at ALF and has experienced a cognitive decline    General Comments General comments (skin integrity, edema, etc.): R Lower leg dressing intact. Shoe cover applied on R foot prior to standing      Pertinent Vitals/Pain Pain Assessment Pain Assessment: 0-10 Pain Score: 4  Pain Location: RLE Pain Descriptors / Indicators: Discomfort    Home Living                          Prior Function            PT Goals (current goals can now be found in the care plan section) Acute Rehab PT Goals Patient Stated Goal: control R LE pain    Frequency    Min 1X/week      PT Plan      Co-evaluation              AM-PAC PT "6 Clicks" Mobility   Outcome Measure  Help needed turning from your back to your side while in a flat bed without using bedrails?: A Little Help needed moving from lying on your back to sitting on the side of a flat bed without using bedrails?: A Lot Help needed moving to and from a bed to a chair (including a wheelchair)?: A Little Help needed standing up from a chair using your arms (e.g., wheelchair or bedside chair)?: A Lot Help needed to walk in hospital room?: A Little Help needed climbing 3-5 steps with a railing? : A Lot 6 Click Score: 15    End of Session Equipment Utilized During Treatment: Gait belt Activity Tolerance: Patient limited by pain Patient left: in chair;with call bell/phone within reach;with chair alarm set Nurse Communication: Mobility status;Precautions PT Visit Diagnosis: Muscle weakness (generalized) (M62.81);Difficulty in walking, not elsewhere classified  (R26.2);Pain Pain - Right/Left: Right Pain - part of body: Leg     Time: 1610-9604 PT Time Calculation (min) (ACUTE ONLY): 19 min  Charges:    $Therapeutic Activity: 8-22 mins PT General Charges $$ ACUTE PT VISIT: 1 Visit                    Zadie Cleverly, PTA  Jannet Askew 04/22/2023, 3:54 PM

## 2023-04-22 NOTE — Progress Notes (Signed)
PROGRESS NOTE    Micheal Aguirre  ZOX:[id number] DOB: December 01, 1955 DOA: 04/17/2023 PCP: Almetta Lovely, [facility]    Brief Narrative:    Micheal Aguirre is a 67 y.o. 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  In the ED, patient was started on IV cefepime vancomycin and Flagyl and was admitted hospital for right leg cellulitis.  At this time physical therapy has recommend home health PT on discharge but facility requesting rehabilitation prior to discharge.  TOC on disposition to skilled nursing facility  Assessment and plan.  Cellulitis of right leg Mild/no erythema but edema noted.  No evidence of sepsis.  Patient received IV Rocephin and vancomycin.  Blood cultures negative so far.  Continue oxycodone for pain, recommend elevation of the leg.  Will discontinue vancomycin and continue Rocephin to complete 7-day course.  Will change to oral antibiotic on discharge.  ESRD on dialysis  Undergoing hemodialysis as per nephrology.    Abdominal discomfort bloating nausea.  Continue with PPI twice daily, on Tums & simethicone. continue bowel regimen.  On Senokot, Dulcolax suppository and MiraLAX.  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 Continue outpatient follow-up with oncology  Chronic diastolic CHF (congestive heart failure)  On hemodialysis for fluid management.  Compensated  HTN (hypertension) Continue Coreg and Demadex.  Elevated LFTs Likely secondary to metastatic liver lesion.  Follow-up as  outpatient.  Hepatitis B surface antigen nonreactive.    Type II diabetes mellitus with renal manifestations (HCC) On sliding scale insulin during hospitalization with optimal control..  Continue diabetic diet.  Patient is on Lantus 8 units at home with NovoLog with meals.    Atrial fibrillation, chronic (HCC) Continue Eliquis for anticoagulation.  On Coreg 25 mg twice daily at home.  Currently on Coreg 12.5 twice daily.  Deconditioning, debility.  Continue physical therapy while in the hospital.  Plan for skilled nursing facility placement.  He is working with [name]    DVT prophylaxis: apixaban (ELIQUIS) tablet 2.5 mg Start: 04/17/23 2200 apixaban (ELIQUIS) tablet 2.5 mg   Code Status:     Code Status: Full Code  Disposition: Patient's daughter wishes to ALF with home health.  Checked with TOC and have been told that ALF recommends rehab. Medically stable for disposition.  Status is: Inpatient  Remains inpatient appropriate because: need for skilled nursing facility placement.   Family Communication:   None at bedside  Consultants:  Nephrology  Procedures:  Hemodialysis  Antimicrobials:  Rocephin IV  Anti-infectives (From admission, onward)    Start     Dose/Rate Route Frequency Ordered Stop   04/22/23 1000  cefTRIAXone (ROCEPHIN) 2 g in sodium chloride 0.9 % 100 mL IVPB        2 g 200 mL/hr over 30 Minutes Intravenous Every 24 hours 04/22/23 0842 04/25/23 0959   04/19/23 1200  vancomycin (VANCOCIN) IVPB 1000 mg/200 mL premix  Status:  Discontinued        1,000 mg 200 mL/hr over 60 Minutes Intravenous Every M-W-F (Hemodialysis) 04/18/23 1216 04/20/23 1256   04/18/23 1200  cefTRIAXone (  ROCEPHIN) 2 g in sodium chloride 0.9 % 100 mL IVPB  Status:  Discontinued        2 g 200 mL/hr over 30 Minutes Intravenous Every 24 hours 04/17/23 1347 04/20/23 1256   04/17/23 2200  metroNIDAZOLE (FLAGYL) IVPB 500 mg  Status:  Discontinued        500 mg 100 mL/hr over 60 Minutes  Intravenous Every 8 hours 04/17/23 1347 04/17/23 1348   04/17/23 1900  vancomycin (VANCOCIN) IVPB 1000 mg/200 mL premix        1,000 mg 200 mL/hr over 60 Minutes Intravenous  Once 04/17/23 1652 04/17/23 2047   04/17/23 1245  vancomycin (VANCOREADY) IVPB 2000 mg/400 mL        2,000 mg 200 mL/hr over 120 Minutes Intravenous  Once 04/17/23 1234 04/17/23 2048   04/17/23 1230  ceFEPIme (MAXIPIME) 2 g in sodium chloride 0.9 % 100 mL IVPB        2 g 200 mL/hr over 30 Minutes Intravenous  Once 04/17/23 1217 04/17/23 1315   04/17/23 1230  metroNIDAZOLE (FLAGYL) IVPB 500 mg        500 mg 100 mL/hr over 60 Minutes Intravenous  Once 04/17/23 1217 04/17/23 1441   04/17/23 1230  vancomycin (VANCOCIN) IVPB 1000 mg/200 mL premix  Status:  Discontinued        1,000 mg 200 mL/hr over 60 Minutes Intravenous  Once 04/17/23 1217 04/17/23 1234      Subjective:  Today, patient was seen and examined at bedside.  Seen during hemodialysis.  Complains of not feeling well with weakness fatigue, nausea and gaseous feeling.  Has not had a bowel movement in 2 days.   Objective: Vitals:   04/21/23 2007 04/22/23 0217 04/22/23 0615 04/22/23 0815  BP: (!) 117/92  113/80 121/77  Pulse: 78  70 79  Resp: 20  16 15   Temp: 98.3 F (36.8 C)  98.1 F (36.7 C) 98.1 F (36.7 C)  TempSrc:   Oral Oral  SpO2: 99%  100% 98%  Weight:  94.7 kg    Height:        Intake/Output Summary (Last 24 hours) at 04/22/2023 1736 Last data filed at 04/22/2023 1626 Gross per 24 hour  Intake 518.14 ml  Output --  Net 518.14 ml   Filed Weights   04/21/23 0815 04/21/23 1233 04/22/23 0217  Weight: 93 kg 92.4 kg 94.7 kg    Physical Examination: Body mass index is 30.83 kg/m.   General: Obese built, appears deconditioned, alert awake Communicative. HENT:   No scleral pallor or icterus noted. Oral mucosa is moist.  Chest:   Diminished breath sounds bilaterally.   Right chest wall dialysis catheter in place. CVS: S1 &S2 heard. No  murmur.  Regular rate and rhythm. Abdomen: Soft, nontender, nondistended.  Bowel sounds are heard.   Extremities: No cyanosis, clubbing . Right lower extremity with erythema and edema covered with dressing.  Erythema has improved. Psych: Alert, awake and oriented, anxious mood, CNS:  No cranial nerve deficits.  Moves all extremities. Skin: Warm and dry.  Right lower extremity erythema edema covered with dressing.  Data Reviewed:   CBC: Recent Labs  Lab 04/17/23 0933 04/18/23 0535 04/19/23 0907 04/20/23 0339 04/21/23 0850 04/22/23 0437  WBC 4.5 5.8 7.4 8.2 10.0 8.2  NEUTROABS 3.9  --   --   --   --   --   HGB 13.2 12.6* 12.1* 11.5* 12.1* 11.8*  HCT 40.9 38.8* 36.8* 34.2* 36.6* 34.9*  MCV 101.5* 100.8* 99.2 98.3 99.5 96.7  PLT 153 148* 126* 120* 133* 109*    Basic Metabolic Panel: Recent Labs  Lab 04/17/23 0933 04/19/23 0907 04/20/23 0339 04/21/23 0850 04/22/23 0437  NA 138 135 136 136 135  K 3.8 3.7 3.7 3.8 3.7  CL 100 101 95* 93* 96*  CO2 21* 20* 25 24 24   GLUCOSE 70 158* 133* 111* 158*  BUN 44* 53* 37* 35* 32*  CREATININE 7.42* 6.76* 4.83* 4.09* 3.39*  CALCIUM 8.7* 8.3* 8.5* 8.5* 8.3*  MG  --   --   --   --  2.0  PHOS  --  2.9  --  3.0  --     Liver Function Tests: Recent Labs  Lab 04/17/23 0933 04/19/23 0907 04/21/23 0850 04/22/23 0437  AST 117*  --   --  188*  ALT 69*  --   --  274*  ALKPHOS 146*  --   --  219*  BILITOT 0.9  --   --  1.2  PROT 7.0  --   --  6.5  ALBUMIN 3.3* 2.7* 2.7* 2.6*     Radiology Studies: No results found.    LOS: 4 days  Time spent 25 minutes  Delfino Lovett, MD [facility] Available via Epic secure chat 7am-7pm After these hours, please refer to coverage provider listed on [email or web address] 04/22/2023, 5:36 PM

## 2023-04-22 NOTE — TOC Progression Note (Signed)
Transition of Care [facility]) - Progression Note    Patient Details  Name: Micheal Aguirre MRN: [id number] Date of Birth: [birth year]  Transition of Care [facility]) CM/SW Contact  Hetty Ely, RN Phone Number: 04/22/2023, 4:12 PM  Clinical Narrative: CM called and spoke with Daughter Micheal Aguirre about who recommended that PT/OT call her back with updated assessment.     Expected Discharge Plan: Skilled Nursing Facility    Expected Discharge Plan and Services                                               Social Determinants of Health (SDOH) Interventions SDOH Screenings   Food Insecurity: No Food Insecurity (04/17/2023)  Housing: Low Risk  (04/17/2023)  Transportation Needs: No Transportation Needs (04/17/2023)  Utilities: Not At Risk (04/17/2023)  Tobacco Use: Medium Risk (04/17/2023)    Readmission Risk Interventions    04/20/2023   11:32 AM  Readmission Risk Prevention Plan  Transportation Screening Complete  Medication Review (RN Care Manager) Complete  HRI or Home Care Consult Complete  SW Recovery Care/Counseling Consult Complete  Palliative Care Screening Complete  Skilled Nursing Facility Not Applicable

## 2023-04-22 NOTE — Plan of Care (Signed)
  Problem: Fluid Volume: Goal: Ability to maintain a balanced intake and output will improve Outcome: Progressing   Problem: Nutritional: Goal: Maintenance of adequate nutrition will improve Outcome: Progressing   Problem: Elimination: Goal: Will not experience complications related to bowel motility Outcome: Progressing Goal: Will not experience complications related to urinary retention Outcome: Progressing   Problem: Pain Managment: Goal: General experience of comfort will improve Outcome: Progressing

## 2023-04-23 DIAGNOSIS — L03115 Cellulitis of right lower limb: Secondary | ICD-10-CM | POA: Diagnosis not present

## 2023-04-23 LAB — BASIC METABOLIC PANEL
Anion gap: 18 — ABNORMAL HIGH (ref 5–15)
BUN: 47 mg/dL — ABNORMAL HIGH (ref 8–23)
CO2: 21 mmol/L — ABNORMAL LOW (ref 22–32)
Calcium: 8.5 mg/dL — ABNORMAL LOW (ref 8.9–10.3)
Chloride: 95 mmol/L — ABNORMAL LOW (ref 98–111)
Creatinine, Ser: 4.96 mg/dL — ABNORMAL HIGH (ref 0.61–1.24)
GFR, Estimated: 12 mL/min — ABNORMAL LOW (ref >60)
Glucose, Bld: 146 mg/dL — ABNORMAL HIGH (ref 70–99)
Potassium: 3.7 mmol/L (ref 3.5–5.1)
Sodium: 134 mmol/L — ABNORMAL LOW (ref 135–145)

## 2023-04-23 LAB — CBC
HCT: 38.2 % — ABNORMAL LOW (ref 39.0–52.0)
Hemoglobin: 12.7 g/dL — ABNORMAL LOW (ref 13.0–17.0)
MCH: 32.7 pg (ref 26.0–34.0)
MCHC: 33.2 g/dL (ref 30.0–36.0)
MCV: 98.5 fL (ref 80.0–100.0)
Platelets: 119 10*3/uL — ABNORMAL LOW (ref 150–400)
RBC: 3.88 MIL/uL — ABNORMAL LOW (ref 4.22–5.81)
RDW: 15.8 % — ABNORMAL HIGH (ref 11.5–15.5)
WBC: 8.1 10*3/uL (ref 4.0–10.5)
nRBC: 3 % — ABNORMAL HIGH (ref 0.0–0.2)

## 2023-04-23 LAB — GLUCOSE, CAPILLARY
Glucose-Capillary: 119 mg/dL — ABNORMAL HIGH (ref 70–99)
Glucose-Capillary: 124 mg/dL — ABNORMAL HIGH (ref 70–99)
Glucose-Capillary: 127 mg/dL — ABNORMAL HIGH (ref 70–99)
Glucose-Capillary: 132 mg/dL — ABNORMAL HIGH (ref 70–99)
Glucose-Capillary: 148 mg/dL — ABNORMAL HIGH (ref 70–99)

## 2023-04-23 MED ORDER — CARVEDILOL 6.25 MG PO TABS
3.1250 mg | ORAL_TABLET | Freq: Two times a day (BID) | ORAL | Status: DC
  Administered 2023-04-24 – 2023-04-25 (×2): 3.125 mg via ORAL
  Filled 2023-04-23 (×2): qty 1

## 2023-04-23 NOTE — Progress Notes (Signed)
[facility]  ROUNDING NOTE   Subjective:   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  He was admitted to [facility] on 04/17/2023 for Right leg pain [M79.604] Cellulitis of right leg [L03.115] Cellulitis of right lower extremity [L03.115]  Patient seen today sitting up in the chair.  Clothes are off. States that he has "bad thoughts" about [facility]. Understands that his dialysis treatment will be scheduled for tomorrow.   Objective:  Vital signs in last 24 hours:  Temp:  [97.8 F (36.6 C)-98 F (36.7 C)] 98 F (36.7 C) (09/01 0828) Pulse Rate:  [67-89] 74 (09/01 0828) Resp:  [16-20] 18 (09/01 0828) BP: (93-111)/(67-80) 93/68 (09/01 0828) SpO2:  [97 %-100 %] 100 % (09/01 0828) Weight:  [95 kg] 95 kg (09/01 0500)  Weight change: 2 kg Filed Weights   04/21/23 1233 04/22/23 0217 04/23/23 0500  Weight: 92.4 kg 94.7 kg 95 kg    Intake/Output: I/O last 3 completed shifts: In: 518.1 [P.O.:480; IV Piggyback:38.1] Out: 0    Intake/Output this shift:  No intake/output data recorded.  Physical Exam: General: NAD,   Head: Normocephalic, atraumatic. Moist oral mucosal membranes  Eyes: Anicteric  Lungs:  Clear to auscultation, normal effort  Heart: Regular rate and rhythm  Abdomen:  Soft, nontender  Extremities:  Right LE ++ weeping edema, erythema and induration, scrotal edema  Neurologic: Alert, able to follow simple commands.  Skin: No lesions  Access: RIJ permcath    Basic Metabolic Panel: Recent Labs  Lab 04/19/23 0907 04/20/23 0339 04/21/23 0850 04/22/23 0437 04/23/23 0452  NA 135 136 136 135 134*  K  3.7 3.7 3.8 3.7 3.7  CL 101 95* 93* 96* 95*  CO2 20* 25 24 24  21*  GLUCOSE 158* 133* 111* 158* 146*  BUN 53* 37* 35* 32* 47*  CREATININE 6.76* 4.83* 4.09* 3.39* 4.96*  CALCIUM 8.3* 8.5* 8.5* 8.3* 8.5*  MG  --   --   --  2.0  --   PHOS 2.9  --  3.0  --   --     Liver Function Tests: Recent Labs  Lab 04/17/23 0933 04/19/23 0907 04/21/23 0850 04/22/23 0437  AST 117*  --   --  188*  ALT 69*  --   --  274*  ALKPHOS 146*  --   --  219*  BILITOT 0.9  --   --  1.2  PROT 7.0  --   --  6.5  ALBUMIN 3.3* 2.7* 2.7* 2.6*   No results for input(s): "LIPASE", "AMYLASE" in the last 168 hours.  No results for input(s): "AMMONIA" in the last 168 hours.  CBC: Recent Labs  Lab 04/17/23 0933 04/18/23 0535 04/19/23 0907 04/20/23 0339 04/21/23 0850 04/22/23 0437 04/23/23 0452  WBC 4.5   < > 7.4 8.2 10.0 8.2 8.1  NEUTROABS 3.9  --   --   --   --   --   --   HGB 13.2   < > 12.1* 11.5* 12.1* 11.8* 12.7*  HCT 40.9   < > 36.8* 34.2* 36.6* 34.9* 38.2*  MCV 101.5*   < > 99.2 98.3 99.5 96.7 98.5  PLT 153   < > 126* 120*  133* 109* 119*   < > = values in this interval not displayed.    Cardiac Enzymes: No results for input(s): "CKTOTAL", "CKMB", "CKMBINDEX", "TROPONINI" in the last 168 hours.  BNP: Invalid input(s): "POCBNP"  CBG: Recent Labs  Lab 04/22/23 0816 04/22/23 1151 04/22/23 1800 04/22/23 2135 04/23/23 0829  GLUCAP 123* 178* 186* 192* 148*    Microbiology: Results for orders placed or performed during the hospital encounter of 04/17/23  SARS Coronavirus 2 by RT PCR (hospital order, performed in [facility] hospital lab) *cepheid single result test* Anterior Nasal Swab     Status: None   Collection Time: 04/17/23 10:57 AM   Specimen: Anterior Nasal Swab  Result Value Ref Range Status   SARS Coronavirus 2 by RT PCR NEGATIVE NEGATIVE Final    Comment: (NOTE) SARS-CoV-2 target nucleic acids are NOT DETECTED.  The SARS-CoV-2 RNA is generally detectable in upper and  lower respiratory specimens during the acute phase of infection. The lowest concentration of SARS-CoV-2 viral copies this assay can detect is 250 copies / mL. A negative result does not preclude SARS-CoV-2 infection and should not be used as the sole basis for treatment or other patient management decisions.  A negative result may occur with improper specimen collection / handling, submission of specimen other than nasopharyngeal swab, presence of viral mutation(s) within the areas targeted by this assay, and inadequate number of viral copies (<250 copies / mL). A negative result must be combined with clinical observations, patient history, and epidemiological information.  Fact Sheet for Patients:   RoadLapTop.co.za  Fact Sheet for Healthcare Providers: http://kim-miller.com/  This test is not yet approved or  cleared by the [place] FDA and has been authorized for detection and/or diagnosis of SARS-CoV-2 by FDA under an Emergency Use Authorization (EUA).  This EUA will remain in effect (meaning this test can be used) for the duration of the COVID-19 declaration under Section 564(b)(1) of the Act, 21 U.S.C. section 360bbb-3(b)(1), unless the authorization is terminated or revoked sooner.  Performed at [facility], [address] [place], [place] [address]   Blood culture (routine x 2)     Status: None   Collection Time: 04/17/23 10:57 AM   Specimen: BLOOD  Result Value Ref Range Status   Specimen Description BLOOD LARM  Final   Special Requests   Final    BOTTLES DRAWN AEROBIC AND ANAEROBIC Blood Culture results may not be optimal due to an inadequate volume of blood received in culture bottles   Culture   Final    NO GROWTH 5 DAYS Performed at [facility], [address] [place], [place] [address]    Report Status 04/22/2023 FINAL  Final  Blood culture (routine x 2)     Status: None   Collection Time:  04/17/23 10:57 AM   Specimen: BLOOD  Result Value Ref Range Status   Specimen Description BLOOD RARM  Final   Special Requests   Final    BOTTLES DRAWN AEROBIC AND ANAEROBIC Blood Culture adequate volume   Culture   Final    NO GROWTH 5 DAYS Performed at [facility], [address] [place], [place] [address]    Report Status 04/22/2023 FINAL  Final  MRSA Next Gen by PCR, Nasal     Status: None   Collection Time: 04/22/23  2:14 AM   Specimen: Nasal Mucosa; Nasal Swab  Result Value Ref Range Status   MRSA by PCR Next Gen NOT DETECTED NOT DETECTED  Final    Comment: (NOTE) The GeneXpert MRSA Assay (FDA approved for NASAL specimens only), is one component of a comprehensive MRSA colonization surveillance program. It is not intended to diagnose MRSA infection nor to guide or monitor treatment for MRSA infections. Test performance is not FDA approved in patients less than 75 years old. Performed at [facility], [address] [place], [place] [address]     Coagulation Studies: No results for input(s): "LABPROT", "INR" in the last 72 hours.  Urinalysis: No results for input(s): "COLORURINE", "LABSPEC", "PHURINE", "GLUCOSEU", "HGBUR", "BILIRUBINUR", "KETONESUR", "PROTEINUR", "UROBILINOGEN", "NITRITE", "LEUKOCYTESUR" in the last 72 hours.  Invalid input(s): "APPERANCEUR"    Imaging: No results found.   Medications:    cefTRIAXone (ROCEPHIN)  IV 2 g (04/23/23 0854)     apixaban  2.5 mg Oral BID   atorvastatin  80 mg Oral QHS   bisacodyl  10 mg Oral Daily   carvedilol  12.5 mg Oral BID WC   Chlorhexidine Gluconate Cloth  6 each Topical Q0600   folic acid  1 mg Oral Daily   gabapentin  100 mg Oral QHS   insulin aspart  0-6 Units Subcutaneous TID WC   melatonin  5 mg Oral QHS   pantoprazole  40 mg Oral Q12H   polyethylene glycol  17 g Oral QHS   predniSONE  10 mg Oral Q breakfast   senna-docusate  2 tablet Oral BID   sodium chloride flush  3 mL  Intravenous Q12H   tamsulosin  0.4 mg Oral QHS   torsemide  60 mg Oral Daily   acetaminophen **OR** acetaminophen, ondansetron **OR** ondansetron (ZOFRAN) IV, oxyCODONE, polyethylene glycol, simethicone  Assessment/ Plan:  [name] is a 67 y.o.  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  He was admitted to [facility] on 04/17/2023 for Right leg pain [M79.604] Cellulitis of right leg [L03.115] Cellulitis of right lower extremity [L03.115]  [facility]  End Stage Renal Disease: requiring four days a week for volume control - Next treatment scheduled for Monday.   Hypertension with chronic kidney disease: hypotensive on encounter.  -Agree with holding amlodipine -Lower temperature during dialysis to prevent hypotension  Anemia of chronic kidney disease: hemoglobin acceptable, 12.7. Holding ESA.  Patient has a history of metastatic prostate cancer  Secondary Hyperparathyroidism: not currently phosphate binders. Calcium and phosphorus at goal  5.  Cellulitis, right lower leg.  Currently on IV Rocephin.     LOS: 5 Wilhelmine Krogstad 9/1/202411:18 AM

## 2023-04-23 NOTE — Plan of Care (Signed)

## 2023-04-23 NOTE — Progress Notes (Addendum)
PROGRESS NOTE    Micheal Aguirre  UJW:[id number] DOB: 1955-10-08 DOA: 04/17/2023 PCP: Almetta Lovely, [facility]    Brief Narrative:    Micheal Aguirre is a 67 y.o. male with medical history significant of ESRD secondary to immune mediated GN on HD, metastatic prostate cancer, CAD s/p CABG, ischemic cardiomyopathy, atrial fibrillation on Eliquis, type 2 diabetes, hypertension, chronic renal disease, duodenal ulcers, cognitive deficits, history of alcohol use disorder, presented to the hospital from [facility] with complains of leg pain from the knee down with some weeping inside of the right leg..  In the ED, patient was started on IV cefepime vancomycin and Flagyl and was admitted hospital for right leg cellulitis.  At this time physical therapy has recommend home health PT on discharge but facility requesting rehabilitation prior to discharge.  TOC on disposition to skilled nursing facility.  04/23/2023: The patient was seen and examined at his bedside.  Reports having some pain in his right lower extremity.  No other complaints.  Pain management is in place.  Currently on antibiotics.  MRSA negative.  Assessment and plan.  Cellulitis of right leg, POA Mild/no erythema but edema noted.  No evidence of sepsis.  Patient received IV Rocephin and vancomycin.  Blood cultures negative so far.  Continue oxycodone for pain, recommend elevation of the leg.  Will discontinue vancomycin and continue Rocephin to complete 7-day course.  Will change to oral antibiotic on discharge.  ESRD on dialysis MT[facility], 4 days a week for volume control Undergoing hemodialysis as per nephrology.    Abdominal discomfort bloating nausea.  Continue with PPI twice daily, on Tums & simethicone. continue bowel regimen.  On Senokot, Dulcolax suppository and MiraLAX.  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 Continue outpatient  follow-up with oncology  Chronic diastolic CHF (congestive heart failure)  On hemodialysis for fluid management.  Compensated  HTN (hypertension) Continue Coreg and Demadex. Coreg dose reduced to 3.125 mg twice daily due to soft BPs. Continue to closely monitor vital signs  Elevated LFTs Likely secondary to metastatic liver lesion.  Follow-up as outpatient.  Hepatitis B surface antigen nonreactive.    Type II diabetes mellitus with renal manifestations (HCC) On sliding scale insulin during hospitalization with optimal control..  Continue diabetic diet.  Patient is on Lantus 8 units at home with NovoLog with meals.    Atrial fibrillation, chronic (HCC) Continue Eliquis for anticoagulation.  On Coreg, home dose reduced. Continue to closely monitor  Deconditioning, debility.  Continue physical therapy while in the hospital.  Plan for skilled nursing facility placement.  He is working with [name]   Time: 35 minutes.    DVT prophylaxis: apixaban (ELIQUIS) tablet 2.5 mg Start: 04/17/23 2200 apixaban (ELIQUIS) tablet 2.5 mg   Code Status:     Code Status: Full Code  Disposition: Patient's daughter wishes to ALF with home health.  Checked with TOC and have been told that ALF recommends rehab. Medically stable for disposition.  Status is: Inpatient  Remains inpatient appropriate because: need for skilled nursing facility placement.   Family Communication:   None at bedside  Consultants:  Nephrology  Procedures:  Hemodialysis  Antimicrobials:  Rocephin IV  Anti-infectives (From admission, onward)    Start     Dose/Rate Route Frequency Ordered Stop   04/22/23 1000  cefTRIAXone (ROCEPHIN) 2 g in sodium chloride 0.9 % 100 mL IVPB  2 g 200 mL/hr over 30 Minutes Intravenous Every 24 hours 04/22/23 0842 04/25/23 0959   04/19/23 1200  vancomycin (VANCOCIN) IVPB 1000 mg/200 mL premix  Status:  Discontinued        1,000 mg 200 mL/hr over 60 Minutes Intravenous Every  M-W-F (Hemodialysis) 04/18/23 1216 04/20/23 1256   04/18/23 1200  cefTRIAXone (ROCEPHIN) 2 g in sodium chloride 0.9 % 100 mL IVPB  Status:  Discontinued        2 g 200 mL/hr over 30 Minutes Intravenous Every 24 hours 04/17/23 1347 04/20/23 1256   04/17/23 2200  metroNIDAZOLE (FLAGYL) IVPB 500 mg  Status:  Discontinued        500 mg 100 mL/hr over 60 Minutes Intravenous Every 8 hours 04/17/23 1347 04/17/23 1348   04/17/23 1900  vancomycin (VANCOCIN) IVPB 1000 mg/200 mL premix        1,000 mg 200 mL/hr over 60 Minutes Intravenous  Once 04/17/23 1652 04/17/23 2047   04/17/23 1245  vancomycin (VANCOREADY) IVPB 2000 mg/400 mL        2,000 mg 200 mL/hr over 120 Minutes Intravenous  Once 04/17/23 1234 04/17/23 2048   04/17/23 1230  ceFEPIme (MAXIPIME) 2 g in sodium chloride 0.9 % 100 mL IVPB        2 g 200 mL/hr over 30 Minutes Intravenous  Once 04/17/23 1217 04/17/23 1315   04/17/23 1230  metroNIDAZOLE (FLAGYL) IVPB 500 mg        500 mg 100 mL/hr over 60 Minutes Intravenous  Once 04/17/23 1217 04/17/23 1441   04/17/23 1230  vancomycin (VANCOCIN) IVPB 1000 mg/200 mL premix  Status:  Discontinued        1,000 mg 200 mL/hr over 60 Minutes Intravenous  Once 04/17/23 1217 04/17/23 1234      Subjective:  Today, patient was seen and examined at bedside.  Seen during hemodialysis.  Complains of not feeling well with weakness fatigue, nausea and gaseous feeling.  Has not had a bowel movement in 2 days.   Objective: Vitals:   04/22/23 2319 04/23/23 0347 04/23/23 0500 04/23/23 0828  BP: 104/80 96/68  93/68  Pulse: 73 89  74  Resp: 18 20  18   Temp:  97.8 F (36.6 C)  98 F (36.7 C)  TempSrc:  Oral    SpO2: 100% 97%  100%  Weight:   95 kg   Height:        Intake/Output Summary (Last 24 hours) at 04/23/2023 1416 Last data filed at 04/23/2023 0644 Gross per 24 hour  Intake 38.14 ml  Output 0 ml  Net 38.14 ml   Filed Weights   04/21/23 1233 04/22/23 0217 04/23/23 0500  Weight: 92.4 kg 94.7  kg 95 kg    Physical Examination: Body mass index is 30.93 kg/m.   General: Obese built, appears deconditioned, alert awake Communicative. HENT:   No scleral pallor or icterus noted. Oral mucosa is moist.  Chest:   Diminished breath sounds bilaterally.   Right chest wall dialysis catheter in place. CVS: S1 &S2 heard. No murmur.  Regular rate and rhythm. Abdomen: Soft, nontender, nondistended.  Bowel sounds are heard.   Extremities: No cyanosis, clubbing . Right lower extremity with erythema and edema covered with dressing.  Erythema has improved. Psych: Alert, awake and oriented, anxious mood, CNS:  No cranial nerve deficits.  Moves all extremities. Skin: Warm and dry.  Right lower extremity erythema edema covered with dressing.  Data Reviewed:   CBC: Recent Labs  Lab 04/17/23 0933 04/18/23 0535 04/19/23 0907 04/20/23 0339 04/21/23 0850 04/22/23 0437 04/23/23 0452  WBC 4.5   < > 7.4 8.2 10.0 8.2 8.1  NEUTROABS 3.9  --   --   --   --   --   --   HGB 13.2   < > 12.1* 11.5* 12.1* 11.8* 12.7*  HCT 40.9   < > 36.8* 34.2* 36.6* 34.9* 38.2*  MCV 101.5*   < > 99.2 98.3 99.5 96.7 98.5  PLT 153   < > 126* 120* 133* 109* 119*   < > = values in this interval not displayed.    Basic Metabolic Panel: Recent Labs  Lab 04/19/23 0907 04/20/23 0339 04/21/23 0850 04/22/23 0437 04/23/23 0452  NA 135 136 136 135 134*  K 3.7 3.7 3.8 3.7 3.7  CL 101 95* 93* 96* 95*  CO2 20* 25 24 24  21*  GLUCOSE 158* 133* 111* 158* 146*  BUN 53* 37* 35* 32* 47*  CREATININE 6.76* 4.83* 4.09* 3.39* 4.96*  CALCIUM 8.3* 8.5* 8.5* 8.3* 8.5*  MG  --   --   --  2.0  --   PHOS 2.9  --  3.0  --   --     Liver Function Tests: Recent Labs  Lab 04/17/23 0933 04/19/23 0907 04/21/23 0850 04/22/23 0437  AST 117*  --   --  188*  ALT 69*  --   --  274*  ALKPHOS 146*  --   --  219*  BILITOT 0.9  --   --  1.2  PROT 7.0  --   --  6.5  ALBUMIN 3.3* 2.7* 2.7* 2.6*     Radiology Studies: No results  found.    LOS: 5 days  Time spent 25 minutes  Darlin Drop, MD [facility] Available via Epic secure chat 7am-7pm After these hours, please refer to coverage provider listed on [email or web address] 04/23/2023, 2:16 PM

## 2023-04-24 DIAGNOSIS — L03115 Cellulitis of right lower limb: Secondary | ICD-10-CM | POA: Diagnosis not present

## 2023-04-24 LAB — RENAL FUNCTION PANEL
Albumin: 2.7 g/dL — ABNORMAL LOW (ref 3.5–5.0)
Anion gap: 21 — ABNORMAL HIGH (ref 5–15)
BUN: 68 mg/dL — ABNORMAL HIGH (ref 8–23)
CO2: 21 mmol/L — ABNORMAL LOW (ref 22–32)
Calcium: 8.6 mg/dL — ABNORMAL LOW (ref 8.9–10.3)
Chloride: 93 mmol/L — ABNORMAL LOW (ref 98–111)
Creatinine, Ser: 6.28 mg/dL — ABNORMAL HIGH (ref 0.61–1.24)
GFR, Estimated: 9 mL/min — ABNORMAL LOW (ref >60)
Glucose, Bld: 132 mg/dL — ABNORMAL HIGH (ref 70–99)
Phosphorus: 4.6 mg/dL (ref 2.5–4.6)
Potassium: 4.5 mmol/L (ref 3.5–5.1)
Sodium: 135 mmol/L (ref 135–145)

## 2023-04-24 LAB — GLUCOSE, CAPILLARY
Glucose-Capillary: 87 mg/dL (ref 70–99)
Glucose-Capillary: 110 mg/dL — ABNORMAL HIGH (ref 70–99)
Glucose-Capillary: 122 mg/dL — ABNORMAL HIGH (ref 70–99)

## 2023-04-24 LAB — CBC
HCT: 36.7 % — ABNORMAL LOW (ref 39.0–52.0)
Hemoglobin: 12.5 g/dL — ABNORMAL LOW (ref 13.0–17.0)
MCH: 32.9 pg (ref 26.0–34.0)
MCHC: 34.1 g/dL (ref 30.0–36.0)
MCV: 96.6 fL (ref 80.0–100.0)
Platelets: 107 10*3/uL — ABNORMAL LOW (ref 150–400)
RBC: 3.8 MIL/uL — ABNORMAL LOW (ref 4.22–5.81)
RDW: 15.9 % — ABNORMAL HIGH (ref 11.5–15.5)
WBC: 8.5 10*3/uL (ref 4.0–10.5)
nRBC: 2.4 % — ABNORMAL HIGH (ref 0.0–0.2)

## 2023-04-24 LAB — LACTIC ACID, PLASMA: Lactic Acid, Venous: 2 mmol/L (ref 0.5–1.9)

## 2023-04-24 MED ORDER — HEPARIN SODIUM (PORCINE) 1000 UNIT/ML IJ SOLN
1000.0000 [IU] | INTRAMUSCULAR | Status: DC | PRN
  Administered 2023-04-24: 1000 [IU]

## 2023-04-24 NOTE — Progress Notes (Signed)
[facility]  ROUNDING NOTE   Subjective:   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  He was admitted to [facility] on 04/17/2023 for Right leg pain [M79.604] Cellulitis of right leg [L03.115] Cellulitis of right lower extremity [L03.115]    HEMODIALYSIS FLOWSHEET:  Blood Flow Rate (mL/min): 399 mL/min Arterial Pressure (mmHg): -252.51 mmHg Venous Pressure (mmHg): 234.13 mmHg TMP (mmHg): -14.14 mmHg Ultrafiltration Rate (mL/min): 0 mL/min Dialysate Flow Rate (mL/min): 299 ml/min Dialysis Fluid Bolus: Normal Saline Bolus Amount (mL): 100 mL   Seen during dialysis.  Tolerating well.    Objective:  Vital signs in last 24 hours:  Temp:  [97.5 F (36.4 C)-98 F (36.7 C)] 97.5 F (36.4 C) (09/02 0328) Pulse Rate:  [28-94] 92 (09/02 1100) Resp:  [8-31] 12 (09/02 1100) BP: (83-111)/(55-82) 88/71 (09/02 1100) SpO2:  [93 %-100 %] 98 % (09/02 1100) Weight:  [93.6 kg-94.8 kg] 93.6 kg (09/02 0803)  Weight change: -0.2 kg Filed Weights   04/23/23 0500 04/24/23 0400 04/24/23 0803  Weight: 95 kg 94.8 kg 93.6 kg    Intake/Output: No intake/output data recorded.   Intake/Output this shift:  No intake/output data recorded.  Physical Exam: General: NAD,   Head: Normocephalic, atraumatic. Moist oral mucosal membranes  Eyes: Anicteric  Lungs:  Clear to auscultation, normal effort  Heart: Regular rate and rhythm  Abdomen:  Soft, nontender  Extremities:  Right LE ++  edema, bandage in place  Neurologic: Alert, able to follow simple commands.  Skin: No lesions  Access: RIJ permcath    Basic Metabolic Panel: Recent Labs   Lab 04/19/23 0907 04/20/23 0339 04/21/23 0850 04/22/23 0437 04/23/23 0452 04/24/23 0556  NA 135 136 136 135 134* 135  K 3.7 3.7 3.8 3.7 3.7 4.5  CL 101 95* 93* 96* 95* 93*  CO2 20* 25 24 24  21* 21*  GLUCOSE 158* 133* 111* 158* 146* 132*  BUN 53* 37* 35* 32* 47* 68*  CREATININE 6.76* 4.83* 4.09* 3.39* 4.96* 6.28*  CALCIUM 8.3* 8.5* 8.5* 8.3* 8.5* 8.6*  MG  --   --   --  2.0  --   --   PHOS 2.9  --  3.0  --   --  4.6    Liver Function Tests: Recent Labs  Lab 04/19/23 0907 04/21/23 0850 04/22/23 0437 04/24/23 0556  AST  --   --  188*  --   ALT  --   --  274*  --   ALKPHOS  --   --  219*  --   BILITOT  --   --  1.2  --   PROT  --   --  6.5  --   ALBUMIN 2.7* 2.7* 2.6* 2.7*   No results for input(s): "LIPASE", "AMYLASE" in the last 168 hours.  No results for input(s): "AMMONIA" in the last 168 hours.  CBC: Recent Labs  Lab 04/20/23 0339 04/21/23 0850 04/22/23 0437 04/23/23 0452 04/24/23 0556  WBC 8.2 10.0 8.2 8.1 8.5  HGB 11.5* 12.1* 11.8* 12.7* 12.5*  HCT 34.2* 36.6* 34.9* 38.2* 36.7*  MCV 98.3 99.5 96.7 98.5 96.6  PLT 120* 133* 109* 119* 107*    Cardiac Enzymes: No results for input(s): "  CKTOTAL", "CKMB", "CKMBINDEX", "TROPONINI" in the last 168 hours.  BNP: Invalid input(s): "POCBNP"  CBG: Recent Labs  Lab 04/23/23 0829 04/23/23 1202 04/23/23 1621 04/23/23 2155 04/23/23 2353  GLUCAP 148* 124* 119* 127* 132*    Microbiology: Results for orders placed or performed during the hospital encounter of 04/17/23  SARS Coronavirus 2 by RT PCR (hospital order, performed in [facility] hospital lab) *cepheid single result test* Anterior Nasal Swab     Status: None   Collection Time: 04/17/23 10:57 AM   Specimen: Anterior Nasal Swab  Result Value Ref Range Status   SARS Coronavirus 2 by RT PCR NEGATIVE NEGATIVE Final    Comment: (NOTE) SARS-CoV-2 target nucleic acids are NOT DETECTED.  The SARS-CoV-2 RNA is generally detectable in upper and  lower respiratory specimens during the acute phase of infection. The lowest concentration of SARS-CoV-2 viral copies this assay can detect is 250 copies / mL. A negative result does not preclude SARS-CoV-2 infection and should not be used as the sole basis for treatment or other patient management decisions.  A negative result may occur with improper specimen collection / handling, submission of specimen other than nasopharyngeal swab, presence of viral mutation(s) within the areas targeted by this assay, and inadequate number of viral copies (<250 copies / mL). A negative result must be combined with clinical observations, patient history, and epidemiological information.  Fact Sheet for Patients:   RoadLapTop.co.za  Fact Sheet for Healthcare Providers: http://kim-miller.com/  This test is not yet approved or  cleared by the [place] FDA and has been authorized for detection and/or diagnosis of SARS-CoV-2 by FDA under an Emergency Use Authorization (EUA).  This EUA will remain in effect (meaning this test can be used) for the duration of the COVID-19 declaration under Section 564(b)(1) of the Act, 21 U.S.C. section 360bbb-3(b)(1), unless the authorization is terminated or revoked sooner.  Performed at [facility], [address] [place], [place] [address]   Blood culture (routine x 2)     Status: None   Collection Time: 04/17/23 10:57 AM   Specimen: BLOOD  Result Value Ref Range Status   Specimen Description BLOOD LARM  Final   Special Requests   Final    BOTTLES DRAWN AEROBIC AND ANAEROBIC Blood Culture results may not be optimal due to an inadequate volume of blood received in culture bottles   Culture   Final    NO GROWTH 5 DAYS Performed at [facility], [address] [place], [place] [address]    Report Status 04/22/2023 FINAL  Final  Blood culture (routine x 2)     Status: None   Collection Time:  04/17/23 10:57 AM   Specimen: BLOOD  Result Value Ref Range Status   Specimen Description BLOOD RARM  Final   Special Requests   Final    BOTTLES DRAWN AEROBIC AND ANAEROBIC Blood Culture adequate volume   Culture   Final    NO GROWTH 5 DAYS Performed at [facility], [address], [place], [place] [address]    Report Status 04/22/2023 FINAL  Final  MRSA Next Gen by PCR, Nasal     Status: None   Collection Time: 04/22/23  2:14 AM   Specimen: Nasal Mucosa; Nasal Swab  Result Value Ref Range Status   MRSA by PCR Next Gen NOT DETECTED NOT DETECTED Final    Comment: (NOTE) The GeneXpert MRSA Assay (FDA approved for NASAL specimens only), is one component of a comprehensive MRSA  colonization surveillance program. It is not intended to diagnose MRSA infection nor to guide or monitor treatment for MRSA infections. Test performance is not FDA approved in patients less than 56 years old. Performed at [facility], [address] [place], [place] [address]     Coagulation Studies: No results for input(s): "LABPROT", "INR" in the last 72 hours.  Urinalysis: No results for input(s): "COLORURINE", "LABSPEC", "PHURINE", "GLUCOSEU", "HGBUR", "BILIRUBINUR", "KETONESUR", "PROTEINUR", "UROBILINOGEN", "NITRITE", "LEUKOCYTESUR" in the last 72 hours.  Invalid input(s): "APPERANCEUR"    Imaging: No results found.   Medications:    cefTRIAXone (ROCEPHIN)  IV 2 g (04/23/23 0854)     apixaban  2.5 mg Oral BID   atorvastatin  80 mg Oral QHS   bisacodyl  10 mg Oral Daily   carvedilol  3.125 mg Oral BID WC   Chlorhexidine Gluconate Cloth  6 each Topical Q0600   folic acid  1 mg Oral Daily   gabapentin  100 mg Oral QHS   insulin aspart  0-6 Units Subcutaneous TID WC   melatonin  5 mg Oral QHS   pantoprazole  40 mg Oral Q12H   polyethylene glycol  17 g Oral QHS   predniSONE  10 mg Oral Q breakfast   senna-docusate  2 tablet Oral BID   sodium chloride flush  3 mL  Intravenous Q12H   tamsulosin  0.4 mg Oral QHS   torsemide  60 mg Oral Daily   acetaminophen **OR** acetaminophen, ondansetron **OR** ondansetron (ZOFRAN) IV, oxyCODONE, polyethylene glycol, simethicone  Assessment/ Plan:  [name] is a 67 y.o.  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  He was admitted to [facility] on 04/17/2023 for Right leg pain [M79.604] Cellulitis of right leg [L03.115] Cellulitis of right lower extremity [L03.115]  [facility] Nephrology [facility]  End Stage Renal Disease: requiring four days a week for volume control -Patient seen during dialysis.  Tolerating well.  UF goal 2 L as tolerated.  Hypertension with chronic kidney disease:  -Agree with holding amlodipine due to low blood pressure. -Lower temperature during dialysis to prevent hypotension -Torsemide 60 mg p.o. daily for volume control. -Can consider midodrine if blood pressure remains consistently low.  Anemia of chronic kidney disease: hemoglobin acceptable, 12.5. Holding ESA.  Patient has a history of metastatic prostate cancer  Secondary Hyperparathyroidism: not currently phosphate binders. Calcium and phosphorus acceptable.  5.  Cellulitis, right lower leg.  Completed  IV Rocephin.     LOS: 6 Micheal Aguirre 9/2/202411:10 AM

## 2023-04-24 NOTE — Progress Notes (Signed)
Received patient in bed to unit.  Alert and oriented.  Informed consent signed and in chart.   TX duration: 3.5 hours  Mid treatment HD was paused due to soft blood pressure SBP<90.   Transported back to the room  Alert, without acute distress.  Hand-off given to patient's nurse.   Access used: Dialysis Catheter right chest Access issues: None  Total UF removed: 700 mL Medication(s) given: None Post HD VS: 99/72 Post HD weight: 93.5 kg (Bed scale)   [facility]

## 2023-04-24 NOTE — TOC Progression Note (Signed)
Transition of Care [facility]) - Progression Note    Patient Details  Name: Micheal Aguirre MRN: [id number] Date of Birth: 07-18-1956  Transition of Care [facility]) CM/SW Contact  Darolyn Rua, [place] Phone Number: 04/24/2023, 1:34 PM  Clinical Narrative:     CSW spoke with patient's daughter Gordy Councilman who reports she had informed covering RNCM on Saturday they choose [facility] and to start auth.   CSW started insurance auth today and notified [facility] of choice, patient's Berkley Harvey was approved hopeful of discharge to [facility].   Expected Discharge Plan: Skilled Nursing Facility    Expected Discharge Plan and Services                                               Social Determinants of Health (SDOH) Interventions SDOH Screenings   Food Insecurity: No Food Insecurity (04/17/2023)  Housing: Low Risk  (04/17/2023)  Transportation Needs: No Transportation Needs (04/17/2023)  Utilities: Not At Risk (04/17/2023)  Tobacco Use: Medium Risk (04/17/2023)    Readmission Risk Interventions    04/20/2023   11:32 AM  Readmission Risk Prevention Plan  Transportation Screening Complete  Medication Review (RN Care Manager) Complete  HRI or Home Care Consult Complete  SW Recovery Care/Counseling Consult Complete  Palliative Care Screening Complete  Skilled Nursing Facility Not Applicable

## 2023-04-24 NOTE — Progress Notes (Signed)
PROGRESS NOTE    Micheal Aguirre  ZOX:[id number] DOB: 02/03/1956 DOA: 04/17/2023 PCP: Almetta Lovely, [facility]    Brief Narrative:    Micheal Aguirre is a 67 y.o. male with medical history significant of ESRD secondary to immune mediated GN on HD, metastatic prostate cancer, CAD s/p CABG, ischemic cardiomyopathy, atrial fibrillation on Eliquis, type 2 diabetes, hypertension, chronic renal disease, duodenal ulcers, cognitive deficits, history of alcohol use disorder, presented to the hospital from [facility] with complains of leg pain from the knee down with some weeping inside of the right leg..  In the ED, patient was started on IV cefepime vancomycin and Flagyl and was admitted hospital for right leg cellulitis.  At this time physical therapy has recommend home health PT on discharge but facility requesting rehabilitation prior to discharge.  TOC on disposition to skilled nursing facility.  04/24/2023: The patient was seen and examined at his bedside after he returned from hemodialysis.  He had some nausea and abdominal cramping.  No other complaints.  Assessment and plan.  Cellulitis of right leg, POA Mild/no erythema but edema noted.  No evidence of sepsis.  Patient received IV Rocephin and vancomycin.  Blood cultures negative so far.  Continue oxycodone for pain, recommend elevation of the leg.  Will discontinue vancomycin and continue Rocephin to complete 7-day course.  Will change to oral antibiotic on discharge.  ESRD on dialysis [facility], 4 days a week for volume control Undergoing hemodialysis as per nephrology.   Latest HD 04/24/23  Abdominal discomfort bloating nausea.  Continue with PPI twice daily, on Tums & simethicone. continue bowel regimen.  On Senokot, Dulcolax suppository and MiraLAX.  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 Continue outpatient follow-up with  oncology  Chronic diastolic CHF (congestive heart failure)  On hemodialysis for fluid management.  Compensated  HTN (hypertension) Continue Coreg and Demadex. Coreg dose reduced to 3.125 mg twice daily due to soft BPs. Continue to closely monitor vital signs  Elevated LFTs Likely secondary to metastatic liver lesion.  Follow-up as outpatient.  Hepatitis B surface antigen nonreactive.    Type II diabetes mellitus with renal manifestations (HCC) On sliding scale insulin during hospitalization with optimal control..  Continue diabetic diet.  Patient is on Lantus 8 units at home with NovoLog with meals.    Atrial fibrillation, chronic (HCC) Continue Eliquis for anticoagulation.  On Coreg, home dose reduced. Continue to closely monitor  Deconditioning, debility.  Continue physical therapy while in the hospital.  Plan for skilled nursing facility placement.  He is working with [name]   Time: 35 minutes.    DVT prophylaxis: apixaban (ELIQUIS) tablet 2.5 mg Start: 04/17/23 2200 apixaban (ELIQUIS) tablet 2.5 mg   Code Status:     Code Status: Full Code  Disposition: Patient's daughter wishes to ALF with home health.  Checked with TOC and have been told that ALF recommends rehab. Medically stable for disposition.  Status is: Inpatient  Remains inpatient appropriate because: need for skilled nursing facility placement.   Family Communication:   None at bedside  Consultants:  Nephrology  Procedures:  Hemodialysis  Antimicrobials:  Rocephin IV  Anti-infectives (From admission, onward)    Start     Dose/Rate Route Frequency Ordered Stop   04/22/23 1000  cefTRIAXone (ROCEPHIN) 2 g in sodium chloride 0.9 % 100 mL IVPB        2 g 200  mL/hr over 30 Minutes Intravenous Every 24 hours 04/22/23 0842 04/24/23 1412   04/19/23 1200  vancomycin (VANCOCIN) IVPB 1000 mg/200 mL premix  Status:  Discontinued        1,000 mg 200 mL/hr over 60 Minutes Intravenous Every M-W-F  (Hemodialysis) 04/18/23 1216 04/20/23 1256   04/18/23 1200  cefTRIAXone (ROCEPHIN) 2 g in sodium chloride 0.9 % 100 mL IVPB  Status:  Discontinued        2 g 200 mL/hr over 30 Minutes Intravenous Every 24 hours 04/17/23 1347 04/20/23 1256   04/17/23 2200  metroNIDAZOLE (FLAGYL) IVPB 500 mg  Status:  Discontinued        500 mg 100 mL/hr over 60 Minutes Intravenous Every 8 hours 04/17/23 1347 04/17/23 1348   04/17/23 1900  vancomycin (VANCOCIN) IVPB 1000 mg/200 mL premix        1,000 mg 200 mL/hr over 60 Minutes Intravenous  Once 04/17/23 1652 04/17/23 2047   04/17/23 1245  vancomycin (VANCOREADY) IVPB 2000 mg/400 mL        2,000 mg 200 mL/hr over 120 Minutes Intravenous  Once 04/17/23 1234 04/17/23 2048   04/17/23 1230  ceFEPIme (MAXIPIME) 2 g in sodium chloride 0.9 % 100 mL IVPB        2 g 200 mL/hr over 30 Minutes Intravenous  Once 04/17/23 1217 04/17/23 1315   04/17/23 1230  metroNIDAZOLE (FLAGYL) IVPB 500 mg        500 mg 100 mL/hr over 60 Minutes Intravenous  Once 04/17/23 1217 04/17/23 1441   04/17/23 1230  vancomycin (VANCOCIN) IVPB 1000 mg/200 mL premix  Status:  Discontinued        1,000 mg 200 mL/hr over 60 Minutes Intravenous  Once 04/17/23 1217 04/17/23 1234      Subjective:  Today, patient was seen and examined at bedside.  Seen during hemodialysis.  Complains of not feeling well with weakness fatigue, nausea and gaseous feeling.  Has not had a bowel movement in 2 days.   Objective: Vitals:   04/24/23 1148 04/24/23 1207 04/24/23 1220 04/24/23 1609  BP: 99/72  92/60 106/67  Pulse: 88  90 (!) 108  Resp: 11  18 18   Temp: (!) 97.5 F (36.4 C)  (!) 97.5 F (36.4 C) 98.2 F (36.8 C)  TempSrc: Axillary  Oral Oral  SpO2: 100%  98% 93%  Weight:  93.5 kg    Height:        Intake/Output Summary (Last 24 hours) at 04/24/2023 1746 Last data filed at 04/24/2023 1640 Gross per 24 hour  Intake 120 ml  Output 700 ml  Net -580 ml   Filed Weights   04/24/23 0400 04/24/23  0803 04/24/23 1207  Weight: 94.8 kg 93.6 kg 93.5 kg    Physical Examination: Body mass index is 30.44 kg/m.   General: Obese built, appears deconditioned, alert awake Communicative. HENT:   No scleral pallor or icterus noted. Oral mucosa is moist.  Chest:   Diminished breath sounds bilaterally.   Right chest wall dialysis catheter in place. CVS: S1 &S2 heard. No murmur.  Regular rate and rhythm. Abdomen: Soft, nontender, nondistended.  Bowel sounds are heard.   Extremities: No cyanosis, clubbing . Right lower extremity with erythema and edema covered with dressing.  Erythema has improved. Psych: Alert, awake and oriented, anxious mood, CNS:  No cranial nerve deficits.  Moves all extremities. Skin: Warm and dry.  Right lower extremity erythema edema covered with dressing.  Data Reviewed:   CBC:  Recent Labs  Lab 04/20/23 0339 04/21/23 0850 04/22/23 0437 04/23/23 0452 04/24/23 0556  WBC 8.2 10.0 8.2 8.1 8.5  HGB 11.5* 12.1* 11.8* 12.7* 12.5*  HCT 34.2* 36.6* 34.9* 38.2* 36.7*  MCV 98.3 99.5 96.7 98.5 96.6  PLT 120* 133* 109* 119* 107*    Basic Metabolic Panel: Recent Labs  Lab 04/19/23 0907 04/20/23 0339 04/21/23 0850 04/22/23 0437 04/23/23 0452 04/24/23 0556  NA 135 136 136 135 134* 135  K 3.7 3.7 3.8 3.7 3.7 4.5  CL 101 95* 93* 96* 95* 93*  CO2 20* 25 24 24  21* 21*  GLUCOSE 158* 133* 111* 158* 146* 132*  BUN 53* 37* 35* 32* 47* 68*  CREATININE 6.76* 4.83* 4.09* 3.39* 4.96* 6.28*  CALCIUM 8.3* 8.5* 8.5* 8.3* 8.5* 8.6*  MG  --   --   --  2.0  --   --   PHOS 2.9  --  3.0  --   --  4.6    Liver Function Tests: Recent Labs  Lab 04/19/23 0907 04/21/23 0850 04/22/23 0437 04/24/23 0556  AST  --   --  188*  --   ALT  --   --  274*  --   ALKPHOS  --   --  219*  --   BILITOT  --   --  1.2  --   PROT  --   --  6.5  --   ALBUMIN 2.7* 2.7* 2.6* 2.7*     Radiology Studies: No results found.    LOS: 6 days  Time spent 25 minutes  Darlin Drop, MD [facility] Available via Epic secure chat 7am-7pm After these hours, please refer to coverage provider listed on [email or web address] 04/24/2023, 5:46 PM

## 2023-04-24 NOTE — Care Management Important Message (Signed)
Important Message  Patient Details  Name: Micheal Aguirre MRN: [id number] Date of Birth: 1955/09/16   Medicare Important Message Given:  Yes  Patient out of room upon time of visit, no family in room.  Copy of Medicare IM left in room for reference.    Johnell Comings 04/24/2023, 11:36 AM

## 2023-04-25 DIAGNOSIS — L03115 Cellulitis of right lower limb: Secondary | ICD-10-CM | POA: Diagnosis not present

## 2023-04-25 LAB — GLUCOSE, CAPILLARY
Glucose-Capillary: 100 mg/dL — ABNORMAL HIGH (ref 70–99)
Glucose-Capillary: 109 mg/dL — ABNORMAL HIGH (ref 70–99)
Glucose-Capillary: 128 mg/dL — ABNORMAL HIGH (ref 70–99)

## 2023-04-25 MED ORDER — CEPHALEXIN 500 MG PO CAPS: 500.0000 mg | ORAL_CAPSULE | ORAL | Status: DC

## 2023-04-25 MED ORDER — CEPHALEXIN 500 MG PO CAPS: 500.0000 mg | ORAL_CAPSULE | Freq: Three times a day (TID) | ORAL | 0 refills | Status: DC

## 2023-04-25 MED ORDER — CEPHALEXIN 500 MG PO CAPS: 500.0000 mg | ORAL_CAPSULE | Freq: Every day | ORAL | 0 refills | Status: AC

## 2023-04-25 MED ORDER — CEPHALEXIN 500 MG PO CAPS: 500.0000 mg | ORAL_CAPSULE | Freq: Every day | ORAL | 0 refills | Status: DC

## 2023-04-25 MED ORDER — OXYCODONE HCL 5 MG PO TABS
5.0000 mg | ORAL_TABLET | Freq: Three times a day (TID) | ORAL | 0 refills | Status: AC | PRN
Start: 2023-04-25 — End: 2023-04-30

## 2023-04-25 NOTE — Discharge Summary (Signed)
Discharge Summary  [name] WNU:[id number] DOB: 06-Mar-1956  PCP: [facility]  Admit date: 04/17/2023 Discharge date: 04/25/2023  Time spent: 35 minutes   Recommendations for Outpatient Follow-up:   Follow up with your PCP in 1-2 weeks. Follow-up with your oncologist outpatient in 1 to 2 weeks. Take your medications as prescribed. Continue PT OT with assistance and fall precautions.   Discharge Diagnoses:  Active Hospital Problems   Diagnosis Date Noted   Cellulitis of right leg 04/17/2023    Priority: 1.   ESRD on dialysis [facility])     Priority: 2.   Prostate cancer (HCC) 11/30/2021    Priority: 3.   Chronic diastolic CHF (congestive heart failure) (HCC) 11/30/2021    Priority: 4.   HTN (hypertension) 02/03/2022    Priority: 5.   Elevated LFTs 04/17/2023    Priority: 6.   Type II diabetes mellitus with renal manifestations (HCC) 02/03/2022    Priority: 7.   Atrial fibrillation, chronic (HCC) 02/03/2022    Priority: 8.    Resolved Hospital Problems  No resolved problems to display.    Discharge Condition: Stable  Diet recommendation: Resume previous diet.  Vitals:   04/25/23 0440 04/25/23 0824  BP: 91/72 95/79  Pulse: 78 80  Resp: 16 14  Temp: 97.8 F (36.6 C) (!) 97.5 F (36.4 C)  SpO2: 98% 100%    History of present illness:  Micheal Aguirre is a 67 y.o. 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  In the ED, patient was started on IV cefepime, IV vancomycin and IV Flagyl.  Admitted for right leg cellulitis.  IV antibiotics were narrowed down to Rocephin and IV vancomycin, and later,  narrowed again to IV Rocephin.  Completed 8 days of IV antibiotics.  Evaluated by PT OT with recommendation for SNF.  TOC assisted with placement to SNF.   04/25/2023: The patient was seen and examined at his bedside.  There were no acute events overnight.  The patient has no new complaints.  Hospital Course:  Principal Problem:   Cellulitis of right leg Active Problems:   ESRD on dialysis [facility])   Prostate cancer (HCC)   Chronic diastolic CHF (congestive heart failure) (HCC)   HTN (hypertension)   Elevated LFTs   Type II diabetes mellitus with renal manifestations (HCC)   Atrial fibrillation, chronic (HCC)  Cellulitis of right leg, POA Completed 8 days of IV antibiotics.  Will add 5 days of Keflex 3 times daily. Continue local wound care as needed Elevate extremity Follow-up with your primary care provider.   ESRD on dialysis [facility], 4 days a week for volume control Resume outpatient hemodialysis.  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 Continue outpatient follow-up with oncology.   Chronic diastolic CHF (congestive heart failure)  Continue hemodialysis.   HTN (hypertension) Continue Coreg and Demadex. Home dose of Coreg reduced to 3.125 mg twice daily due to soft BPs.   Elevated LFTs Likely secondary to metastatic liver lesion.  Follow-up as outpatient.  Hepatitis B surface antigen nonreactive.     Type II diabetes  mellitus with renal manifestations (HCC) Continue diabetic diet.   Insulin held to avoid hypoglycemia Last hemoglobin A1c 5.6 on 11/27/2021.   Atrial fibrillation, chronic (HCC) Continue Eliquis for anticoagulation.   Rate controlled on Coreg   Deconditioning, debility.   Continue PT OT with assistance and fall precautions.        Code Status:     Code Status: Full Code    Discharge Exam: BP 95/79 (BP Location: Left Arm)   Pulse 80    Temp (!) 97.5 F (36.4 C) (Oral)   Resp 14   Ht 5\' 9"  (1.753 m)   Wt 93.6 kg   SpO2 100%   BMI 30.47 kg/m  General: 67 y.o. year-old male well developed well nourished in no acute distress.  Alert and oriented x3. Cardiovascular: Irregular rate and rhythm with no rubs or gallops.  No thyromegaly or JVD noted.   Respiratory: Clear to auscultation with no wheezes or rales. Good inspiratory effort. Abdomen: Soft nontender nondistended with normal bowel sounds x4 quadrants. Psychiatry: Mood is appropriate for condition and setting  Discharge Instructions You were cared for by a hospitalist during your hospital stay. If you have any questions about your discharge medications or the care you received while you were in the hospital after you are discharged, you can call the unit and asked to speak with the hospitalist on call if the hospitalist that took care of you is not available. Once you are discharged, your primary care physician will handle any further medical issues. Please note that NO REFILLS for any discharge medications will be authorized once you are discharged, as it is imperative that you return to your primary care physician (or establish a relationship with a primary care physician if you do not have one) for your aftercare needs so that they can reassess your need for medications and monitor your lab values.   Allergies as of 04/25/2023       Reactions   Hydralazine    Other reaction(s): Kidney Disorder, Other (See Comments) Concern for drug-induced AIN in 06/2021   Lisinopril Swelling, Other (See Comments)   Angioedema   Bimatoprost    Other reaction(s): Unknown   Empagliflozin Other (See Comments)   UTI Other reaction(s): uti        Medication List     STOP taking these medications    amLODipine 5 MG tablet Commonly known as: NORVASC   aspirin EC 81 MG tablet   calcium carbonate 500 MG chewable tablet Commonly known as: TUMS - dosed in mg elemental calcium    carvedilol 25 MG tablet Commonly known as: COREG   famotidine 10 MG tablet Commonly known as: PEPCID   ferrous fumarate-b12-vitamic C-folic acid capsule Commonly known as: TRINSICON / FOLTRIN   Lantus SoloStar 100 UNIT/ML Solostar Pen Generic drug: insulin glargine   methocarbamol 500 MG tablet Commonly known as: ROBAXIN   NovoLOG FlexPen 100 UNIT/ML FlexPen Generic drug: insulin aspart   oxybutynin 5 MG tablet Commonly known as: DITROPAN   Vitamin D (Ergocalciferol) 1.25 MG (50000 UNIT) Caps capsule Commonly known as: DRISDOL   zinc oxide 20 % ointment       TAKE these medications    acetaminophen 500 MG tablet Commonly known as: TYLENOL Take 1,000 mg by mouth every 8 (eight) hours as needed for mild pain or moderate pain.   apixaban 2.5 MG Tabs tablet Commonly known as: ELIQUIS Take 1 tablet (2.5 mg total) by mouth 2 (two) times daily.  atorvastatin 80 MG tablet Commonly known as: LIPITOR Take 80 mg by mouth daily.   bisacodyl 5 MG EC tablet Commonly known as: DULCOLAX Take 10 mg by mouth daily.   cephALEXin 500 MG capsule Commonly known as: KEFLEX Take 1 capsule (500 mg total) by mouth every 8 (eight) hours for 5 days.   folic acid 1 MG tablet Commonly known as: FOLVITE Take 1 mg by mouth daily.   gabapentin 100 MG capsule Commonly known as: NEURONTIN Take 100 mg by mouth at bedtime.   melatonin 3 MG Tabs tablet Take 6 mg by mouth at bedtime.   multivitamin with minerals tablet Take 1 tablet by mouth every evening.   ondansetron 4 MG tablet Commonly known as: ZOFRAN Take 4 mg by mouth every 8 (eight) hours as needed for nausea or vomiting.   oxyCODONE 5 MG immediate release tablet Commonly known as: Roxicodone Take 1 tablet (5 mg total) by mouth every 8 (eight) hours as needed for up to 5 days for severe pain. What changed: reasons to take this   pantoprazole 40 MG tablet Commonly known as: PROTONIX Take 40 mg by mouth every 12  (twelve) hours.   polyethylene glycol 17 g packet Commonly known as: MIRALAX / GLYCOLAX Take 17 g by mouth at bedtime.   predniSONE 10 MG tablet Commonly known as: DELTASONE Take 10 mg by mouth daily with breakfast.   senna-docusate 8.6-50 MG tablet Commonly known as: Senokot-S Take 2 tablets by mouth 2 (two) times daily.   simethicone 80 MG chewable tablet Commonly known as: MYLICON Chew 80 mg by mouth in the morning and at bedtime.   tamsulosin 0.4 MG Caps capsule Commonly known as: FLOMAX Take 0.4 mg by mouth at bedtime.   torsemide 20 MG tablet Commonly known as: DEMADEX Take 60 mg by mouth daily.       Allergies  Allergen Reactions   Hydralazine     Other reaction(s): Kidney Disorder, Other (See Comments) Concern for drug-induced AIN in 06/2021    Lisinopril Swelling and Other (See Comments)    Angioedema    Bimatoprost     Other reaction(s): Unknown   Empagliflozin Other (See Comments)    UTI Other reaction(s): uti     Contact information for follow-up providers     [facility] Call today.   Specialty: Geriatric Medicine Why: Please call for a post hospital follow up appointment Contact information: [address] [place] [address] [phone]              Contact information for after-discharge care     Destination     [facility] .   Service: Skilled Nursing Contact information: [address] [place] [address] [phone]                      The results of significant diagnostics from this hospitalization (including imaging, microbiology, ancillary and laboratory) are listed below for reference.    Significant Diagnostic Studies: DG Tibia/Fibula Right  Result Date: 04/17/2023 CLINICAL DATA:  Shortness of breath.  Leg pain EXAM: RIGHT TIBIA AND FIBULA - 2 VIEW COMPARISON:  11/27/2021 FINDINGS: Prior ORIF of the proximal right tibia and distal  femur. There is no evidence of acute fracture or other focal bone lesions. Healed fracture deformity of the proximal fibula. Generalized soft tissue swelling. Atherosclerotic vascular calcifications. IMPRESSION: Generalized soft tissue swelling.  No acute findings. Electronically Signed  By: Duanne Guess D.O.   On: 04/17/2023 13:41   DG Chest Portable 1 View  Result Date: 04/17/2023 CLINICAL DATA:  Shortness of breath.  Evaluate for edema. EXAM: PORTABLE CHEST 1 VIEW COMPARISON:  03/20/2023 FINDINGS: There is a right chest wall dialysis catheter with tips in the right atrium. Previous median sternotomy and CABG procedure. Heart size and mediastinal contours appear normal. Lung volumes are low. Scar versus platelike atelectasis noted in the left base. No pleural effusion, interstitial edema or airspace disease. IMPRESSION: 1. No signs of interstitial edema or pleural effusion. 2. Decreased lung volumes with bibasilar scar versus platelike atelectasis. 3. Low lung volumes. Electronically Signed   By: Signa Kell M.D.   On: 04/17/2023 13:13   US Venous Img Lower Unilateral Right  Result Date: 04/17/2023 CLINICAL DATA:  Right lower extremity edema. EXAM: RIGHT LOWER EXTREMITY VENOUS DOPPLER ULTRASOUND TECHNIQUE: Gray-scale sonography with graded compression, as well as color Doppler and duplex ultrasound were performed to evaluate the lower extremity deep venous systems from the level of the common femoral vein and including the common femoral, femoral, profunda femoral, popliteal and calf veins including the posterior tibial, peroneal and gastrocnemius veins when visible. The superficial great saphenous vein was also interrogated. Spectral Doppler was utilized to evaluate flow at rest and with distal augmentation maneuvers in the common femoral, femoral and popliteal veins. COMPARISON:  None Available. FINDINGS: Contralateral Common Femoral Vein: Respiratory phasicity is normal and symmetric with the  symptomatic side. No evidence of thrombus. Normal compressibility. Common Femoral Vein: No evidence of thrombus. Normal compressibility, respiratory phasicity and response to augmentation. Saphenofemoral Junction: No evidence of thrombus. Normal compressibility and flow on color Doppler imaging. Profunda Femoral Vein: No evidence of thrombus. Normal compressibility and flow on color Doppler imaging. Femoral Vein: No evidence of thrombus. Normal compressibility, respiratory phasicity and response to augmentation. Popliteal Vein: No evidence of thrombus. Normal compressibility, respiratory phasicity and response to augmentation. Calf Veins: No evidence of thrombus. Normal compressibility and flow on color Doppler imaging. Superficial Great Saphenous Vein: No evidence of thrombus. Normal compressibility. Venous Reflux:  None. Other Findings: No evidence of superficial thrombophlebitis or abnormal fluid collection. There is a single visualized right inguinal lymph node measuring roughly 3.0 x 1.9 x 2.0 cm that demonstrates atypical morphology without a visualized fatty hilum. This is nonspecific and may still potentially represent a reactive lymph node. Lymph node pathology cannot be excluded based on sonographic appearance. IMPRESSION: 1. No evidence of right lower extremity deep venous thrombosis. 2. Single visualized right inguinal lymph node measuring roughly 2 cm in short axis. This is nonspecific and may represent a reactive lymph node. Lymph node pathology cannot be excluded based on atypical sonographic appearance. Electronically Signed   By: Irish Lack M.D.   On: 04/17/2023 12:11   US ABDOMEN LIMITED RUQ (LIVER/GB)  Result Date: 04/11/2023 CLINICAL DATA:  Right upper quadrant abdominal pain. EXAM: ULTRASOUND ABDOMEN LIMITED RIGHT UPPER QUADRANT COMPARISON:  CT of the abdomen April 19, 2023 FINDINGS: Gallbladder: No gallstones or wall thickening visualized. No sonographic Murphy sign noted by  sonographer. Common bile duct: Diameter: 3.6 mm Liver: Irregular liver contour with increased echogenicity. Several hyperechoic liver masses are seen. The dominant liver mass in the right lobe of the liver measures 4.6 x 4.8 x 4.4 cm. Second right lobe liver mass measures 3.8 x 4.2 x 3.2 cm. Third liver mass in the right lobe measures 3.6 x 2.8 x 4.4 cm. Portal vein is patent on  color Doppler imaging with normal direction of blood flow towards the liver. Other: None. IMPRESSION: Several highly suspicious liver masses are concerning for primary hepatic malignancy or metastatic disease. Normal gallbladder. Electronically Signed   By: Ted Mcalpine M.D.   On: 04/11/2023 12:58   CT ABDOMEN PELVIS WO CONTRAST  Result Date: 04/11/2023 CLINICAL DATA:  Abdominal pain, acute, nonlocalized upper abd pain EXAM: CT ABDOMEN AND PELVIS WITHOUT CONTRAST TECHNIQUE: Multidetector CT imaging of the abdomen and pelvis was performed following the standard protocol without IV contrast. RADIATION DOSE REDUCTION: This exam was performed according to the departmental dose-optimization program which includes automated exposure control, adjustment of the mA and/or kV according to patient size and/or use of iterative reconstruction technique. COMPARISON:  CT abdomen and pelvis October 2023, ultrasound abdomen same day FINDINGS: Inferior chest: The lung bases are well-aerated. Hepatobiliary: There are new multiple hypodense lesions in the liver, for example measuring up to 5.1 x 5.1 cm in the right hepatic lobe. No intrahepatic or extrahepatic biliary ductal dilation. The gallbladder appears normal. Spleen: Normal in size without focal abnormality. Pancreas: Atrophic appearance of the pancreas with diffuse parenchymal calcifications. No focal lesion or ductal dilation identified. Adrenals/Urinary Tract: Adrenal glands are unremarkable. The kidneys are atrophic. No hydronephrosis. The urinary bladder is decompressed. Stomach/Bowel:  The stomach, small bowel and large bowel are normal in caliber without abnormal wall thickening or surrounding inflammatory changes. Scattered diverticula of the distal descending colon and proximal sigmoid colon. The appendix is normal. Reproductive: Prostate is unremarkable. Lymphatic: Multiple enlarged lymph nodes are noted in the right inguinal soft tissues and along the right external iliac chain. For example, measuring up to 1.8 cm in the short axis along the right external iliac chain. Additionally, there are borderline enlarged porta hepatis lymph nodes, for example measuring up to 1.0 cm in the short axis. Vasculature: The abdominal aorta is normal in caliber. Scattered atherosclerotic calcifications. Other: No abdominopelvic ascites. Musculoskeletal: No aggressive osseous lesions. Similar chronic compression deformities are noted involving the T10 and L2 vertebral bodies. The soft tissues are unremarkable. IMPRESSION: 1. There are new multiple hypodense lesions scattered throughout the liver, incompletely characterized on this noncontrasted exam. Borderline enlarged lymph nodes are also present in the porta hepatis. Findings raise concern for neoplastic process. Further evaluation with contrast-enhanced CT or MRI liver protocol is recommended. 2. There are new and asymmetrically enlarged right inguinal and external iliac chain lymph nodes, indeterminate but suspicious for underlying pathology given concomitant findings in the liver. These are amenable for image guided biopsy if clinically desired. 3. Colonic diverticulosis without active inflammatory changes. 4. Atrophic appearance of the pancreas with calcifications, presumed sequela of chronic pancreatitis. Electronically Signed   By: Olive Bass M.D.   On: 04/11/2023 11:38    Microbiology: Recent Results (from the past 240 hour(s))  SARS Coronavirus 2 by RT PCR (hospital order, performed in [facility] hospital lab) *cepheid single result test*  Anterior Nasal Swab     Status: None   Collection Time: 04/17/23 10:57 AM   Specimen: Anterior Nasal Swab  Result Value Ref Range Status   SARS Coronavirus 2 by RT PCR NEGATIVE NEGATIVE Final    Comment: (NOTE) SARS-CoV-2 target nucleic acids are NOT DETECTED.  The SARS-CoV-2 RNA is generally detectable in upper and lower respiratory specimens during the acute phase of infection. The lowest concentration of SARS-CoV-2 viral copies this assay can detect is 250 copies / mL. A negative result does not preclude SARS-CoV-2 infection and should  not be used as the sole basis for treatment or other patient management decisions.  A negative result may occur with improper specimen collection / handling, submission of specimen other than nasopharyngeal swab, presence of viral mutation(s) within the areas targeted by this assay, and inadequate number of viral copies (<250 copies / mL). A negative result must be combined with clinical observations, patient history, and epidemiological information.  Fact Sheet for Patients:   RoadLapTop.co.za  Fact Sheet for Healthcare Providers: http://kim-miller.com/  This test is not yet approved or  cleared by the [place] FDA and has been authorized for detection and/or diagnosis of SARS-CoV-2 by FDA under an Emergency Use Authorization (EUA).  This EUA will remain in effect (meaning this test can be used) for the duration of the COVID-19 declaration under Section 564(b)(1) of the Act, 21 U.S.C. section 360bbb-3(b)(1), unless the authorization is terminated or revoked sooner.  Performed at [facility], [address] [place], [place] [address]   Blood culture (routine x 2)     Status: None   Collection Time: 04/17/23 10:57 AM   Specimen: BLOOD  Result Value Ref Range Status   Specimen Description BLOOD LARM  Final   Special Requests   Final    BOTTLES DRAWN AEROBIC AND ANAEROBIC Blood  Culture results may not be optimal due to an inadequate volume of blood received in culture bottles   Culture   Final    NO GROWTH 5 DAYS Performed at [facility], [address], [place], [place] [address]    Report Status 04/22/2023 FINAL  Final  Blood culture (routine x 2)     Status: None   Collection Time: 04/17/23 10:57 AM   Specimen: BLOOD  Result Value Ref Range Status   Specimen Description BLOOD RARM  Final   Special Requests   Final    BOTTLES DRAWN AEROBIC AND ANAEROBIC Blood Culture adequate volume   Culture   Final    NO GROWTH 5 DAYS Performed at [facility], [address], [place], [place] [address]    Report Status 04/22/2023 FINAL  Final  MRSA Next Gen by PCR, Nasal     Status: None   Collection Time: 04/22/23  2:14 AM   Specimen: Nasal Mucosa; Nasal Swab  Result Value Ref Range Status   MRSA by PCR Next Gen NOT DETECTED NOT DETECTED Final    Comment: (NOTE) The GeneXpert MRSA Assay (FDA approved for NASAL specimens only), is one component of a comprehensive MRSA colonization surveillance program. It is not intended to diagnose MRSA infection nor to guide or monitor treatment for MRSA infections. Test performance is not FDA approved in patients less than 81 years old. Performed at [facility], [address] [place], [place] [address]      Labs: Basic Metabolic Panel: Recent Labs  Lab 04/19/23 [phone] 04/20/23 0339 04/21/23 0850 04/22/23 0437 04/23/23 0452 04/24/23 0556  NA 135 136 136 135 134* 135  K 3.7 3.7 3.8 3.7 3.7 4.5  CL 101 95* 93* 96* 95* 93*  CO2 20* 25 24 24  21* 21*  GLUCOSE 158* 133* 111* 158* 146* 132*  BUN 53* 37* 35* 32* 47* 68*  CREATININE 6.76* 4.83* 4.09* 3.39* 4.96* 6.28*  CALCIUM 8.3* 8.5* 8.5* 8.3* 8.5* 8.6*  MG  --   --   --  2.0  --   --   PHOS 2.9  --  3.0  --   --  4.6   Liver  Function Tests: Recent Labs  Lab 04/19/23 [phone] 04/21/23 0850 04/22/23 0437 04/24/23 0556  AST  --   --   188*  --   ALT  --   --  274*  --   ALKPHOS  --   --  219*  --   BILITOT  --   --  1.2  --   PROT  --   --  6.5  --   ALBUMIN 2.7* 2.7* 2.6* 2.7*   No results for input(s): "LIPASE", "AMYLASE" in the last 168 hours. No results for input(s): "AMMONIA" in the last 168 hours. CBC: Recent Labs  Lab 04/20/23 0339 04/21/23 0850 04/22/23 0437 04/23/23 0452 04/24/23 0556  WBC 8.2 10.0 8.2 8.1 8.5  HGB 11.5* 12.1* 11.8* 12.7* 12.5*  HCT 34.2* 36.6* 34.9* 38.2* 36.7*  MCV 98.3 99.5 96.7 98.5 96.6  PLT 120* 133* 109* 119* 107*   Cardiac Enzymes: No results for input(s): "CKTOTAL", "CKMB", "CKMBINDEX", "TROPONINI" in the last 168 hours. BNP: BNP (last 3 results) No results for input(s): "BNP" in the last 8760 hours.  ProBNP (last 3 results) No results for input(s): "PROBNP" in the last 8760 hours.  CBG: Recent Labs  Lab 04/23/23 2353 04/24/23 1227 04/24/23 1611 04/24/23 2212 04/25/23 0822  GLUCAP 132* 87 110* 122* 100*       Signed:  Darlin Drop, MD [facility] 04/25/2023, 11:21 AM

## 2023-04-25 NOTE — TOC Progression Note (Addendum)
Transition of Care [facility]) - Progression Note    Patient Details  Name: Micheal Aguirre MRN: [id number] Date of Birth: 1956-08-12  Transition of Care [facility]) CM/SW Contact  Margarito Liner, LCSW Phone Number: 04/25/2023, 11:38 AM  Clinical Narrative:  CSW left voicemail for daughter. Will set up EMS transport once she calls back.   11:54 am: Daughter called back. CSW provided update. CSW left voicemail for SNF admissions coordinator to ensure that they are aware patient needs HD on Monday, Wednesday, Thursday, Friday instead of MWF.   Expected Discharge Plan: Skilled Nursing Facility    Expected Discharge Plan and Services         Expected Discharge Date: 04/25/23                                     Social Determinants of Health (SDOH) Interventions SDOH Screenings   Food Insecurity: No Food Insecurity (04/17/2023)  Housing: Low Risk  (04/17/2023)  Transportation Needs: No Transportation Needs (04/17/2023)  Utilities: Not At Risk (04/17/2023)  Tobacco Use: Medium Risk (04/17/2023)    Readmission Risk Interventions    04/20/2023   11:32 AM  Readmission Risk Prevention Plan  Transportation Screening Complete  Medication Review (RN Care Manager) Complete  HRI or Home Care Consult Complete  SW Recovery Care/Counseling Consult Complete  Palliative Care Screening Complete  Skilled Nursing Facility Not Applicable

## 2023-04-25 NOTE — Progress Notes (Signed)
IV's removed. Patient discharged to the care of EMS in stable condition.  Micheal Aguirre

## 2023-04-25 NOTE — Progress Notes (Signed)
Report called to [phone] Rm [id number]   [name]

## 2023-04-25 NOTE — Plan of Care (Signed)

## 2023-04-25 NOTE — Progress Notes (Signed)
[facility]  ROUNDING NOTE   Subjective:   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  He was admitted to [facility] on 04/17/2023 for Right leg pain [M79.604] Cellulitis of right leg [L03.115] Cellulitis of right lower extremity [L03.115]  Patient sitting up in bed Partially completed breakfast tray at bedside Denies pain  Lower extremity edema improved.    Objective:  Vital signs in last 24 hours:  Temp:  [97.5 F (36.4 C)-98.4 F (36.9 C)] 97.5 F (36.4 C) (09/03 0824) Pulse Rate:  [72-108] 80 (09/03 0824) Resp:  [14-18] 14 (09/03 0824) BP: (91-112)/(67-79) 95/79 (09/03 0824) SpO2:  [93 %-100 %] 100 % (09/03 0824) Weight:  [93.6 kg] 93.6 kg (09/03 0500)  Weight change: -1.2 kg Filed Weights   04/24/23 0803 04/24/23 1207 04/25/23 0500  Weight: 93.6 kg 93.5 kg 93.6 kg    Intake/Output: I/O last 3 completed shifts: In: 120 [P.O.:120] Out: 700 [Other:700]   Intake/Output this shift:  No intake/output data recorded.  Physical Exam: General: NAD,   Head: Normocephalic, atraumatic. Moist oral mucosal membranes  Eyes: Anicteric  Lungs:  Clear to auscultation, normal effort  Heart: Regular rate and rhythm  Abdomen:  Soft, nontender  Extremities:  Right LE ++  edema, bandage in place  Neurologic: Alert, able to follow simple commands.  Skin: No lesions  Access: RIJ permcath    Basic Metabolic Panel: Recent Labs  Lab 04/19/23 0907 04/20/23 0339 04/21/23 0850 04/22/23 0437 04/23/23 0452 04/24/23 0556  NA 135 136 136 135 134* 135  K 3.7 3.7 3.8 3.7 3.7 4.5  CL 101 95* 93* 96* 95* 93*  CO2 20* 25 24 24  21*  21*  GLUCOSE 158* 133* 111* 158* 146* 132*  BUN 53* 37* 35* 32* 47* 68*  CREATININE 6.76* 4.83* 4.09* 3.39* 4.96* 6.28*  CALCIUM 8.3* 8.5* 8.5* 8.3* 8.5* 8.6*  MG  --   --   --  2.0  --   --   PHOS 2.9  --  3.0  --   --  4.6    Liver Function Tests: Recent Labs  Lab 04/19/23 0907 04/21/23 0850 04/22/23 0437 04/24/23 0556  AST  --   --  188*  --   ALT  --   --  274*  --   ALKPHOS  --   --  219*  --   BILITOT  --   --  1.2  --   PROT  --   --  6.5  --   ALBUMIN 2.7* 2.7* 2.6* 2.7*   No results for input(s): "LIPASE", "AMYLASE" in the last 168 hours.  No results for input(s): "AMMONIA" in the last 168 hours.  CBC: Recent Labs  Lab 04/20/23 0339 04/21/23 0850 04/22/23 0437 04/23/23 0452 04/24/23 0556  WBC 8.2 10.0 8.2 8.1 8.5  HGB 11.5* 12.1* 11.8* 12.7* 12.5*  HCT 34.2* 36.6* 34.9* 38.2* 36.7*  MCV 98.3 99.5 96.7 98.5 96.6  PLT 120* 133* 109* 119* 107*    Cardiac Enzymes: No results for input(s): "CKTOTAL", "CKMB", "CKMBINDEX", "TROPONINI" in the last 168 hours.  BNP: Invalid input(s): "POCBNP"  CBG: Recent Labs  Lab 04/24/23 1611 04/24/23 2212 04/25/23 0822 04/25/23 1221 04/25/23 1226  GLUCAP 110* 122* 100* 128* 109*    Microbiology: Results for orders placed or performed during the hospital encounter of 04/17/23  SARS Coronavirus 2 by RT PCR (hospital order, performed in [facility] [place] hospital lab) *cepheid single result test* Anterior Nasal Swab     Status: None   Collection Time: 04/17/23 10:57 AM   Specimen: Anterior Nasal Swab  Result Value Ref Range Status   SARS Coronavirus 2 by RT PCR NEGATIVE NEGATIVE Final    Comment: (NOTE) SARS-CoV-2 target nucleic acids are NOT DETECTED.  The SARS-CoV-2 RNA is generally detectable in upper and lower respiratory specimens during the acute phase of infection. The lowest concentration of SARS-CoV-2 viral copies this assay can detect is 250 copies / mL. A negative result does not preclude SARS-CoV-2  infection and should not be used as the sole basis for treatment or other patient management decisions.  A negative result may occur with improper specimen collection / handling, submission of specimen other than nasopharyngeal swab, presence of viral mutation(s) within the areas targeted by this assay, and inadequate number of viral copies (<250 copies / mL). A negative result must be combined with clinical observations, patient history, and epidemiological information.  Fact Sheet for Patients:   RoadLapTop.co.za  Fact Sheet for Healthcare Providers: http://kim-miller.com/  This test is not yet approved or  cleared by the [place] FDA and has been authorized for detection and/or diagnosis of SARS-CoV-2 by FDA under an Emergency Use Authorization (EUA).  This EUA will remain in effect (meaning this test can be used) for the duration of the COVID-19 declaration under Section 564(b)(1) of the Act, 21 U.S.C. section 360bbb-3(b)(1), unless the authorization is terminated or revoked sooner.  Performed at [facility], [address] [place], [place] [address]   Blood culture (routine x 2)     Status: None   Collection Time: 04/17/23 10:57 AM   Specimen: BLOOD  Result Value Ref Range Status   Specimen Description BLOOD LARM  Final   Special Requests   Final    BOTTLES DRAWN AEROBIC AND ANAEROBIC Blood Culture results may not be optimal due to an inadequate volume of blood received in culture bottles   Culture   Final    NO GROWTH 5 DAYS Performed at [facility], [address] [place], [place] [address]    Report Status 04/22/2023 FINAL  Final  Blood culture (routine x 2)     Status: None   Collection Time: 04/17/23 10:57 AM   Specimen: BLOOD  Result Value Ref Range Status   Specimen Description BLOOD RARM  Final   Special Requests   Final    BOTTLES DRAWN AEROBIC AND ANAEROBIC Blood Culture adequate volume    Culture   Final    NO GROWTH 5 DAYS Performed at [facility], [address], [place], [place] [address]    Report Status 04/22/2023 FINAL  Final  MRSA Next Gen by PCR, Nasal     Status: None   Collection Time: 04/22/23  2:14 AM   Specimen: Nasal Mucosa; Nasal Swab  Result Value Ref Range Status   MRSA by PCR Next Gen NOT DETECTED NOT DETECTED Final    Comment: (NOTE) The GeneXpert MRSA Assay (FDA approved for NASAL specimens only), is one component of a comprehensive MRSA colonization surveillance program. It is not intended to diagnose MRSA infection nor to guide or monitor treatment for MRSA infections. Test performance is not FDA approved in patients less than 2  years old. Performed at [facility], [address] [place], [place] [address]     Coagulation Studies: No results for input(s): "LABPROT", "INR" in the last 72 hours.  Urinalysis: No results for input(s): "COLORURINE", "LABSPEC", "PHURINE", "GLUCOSEU", "HGBUR", "BILIRUBINUR", "KETONESUR", "PROTEINUR", "UROBILINOGEN", "NITRITE", "LEUKOCYTESUR" in the last 72 hours.  Invalid input(s): "APPERANCEUR"    Imaging: No results found.   Medications:       apixaban  2.5 mg Oral BID   atorvastatin  80 mg Oral QHS   bisacodyl  10 mg Oral Daily   carvedilol  3.125 mg Oral BID WC   cephALEXin  500 mg Oral Q24H   [START ON 04/26/2023] cephALEXin  500 mg Oral Q M,W,F-HD   Chlorhexidine Gluconate Cloth  6 each Topical Q0600   folic acid  1 mg Oral Daily   gabapentin  100 mg Oral QHS   insulin aspart  0-6 Units Subcutaneous TID WC   melatonin  5 mg Oral QHS   pantoprazole  40 mg Oral Q12H   polyethylene glycol  17 g Oral QHS   predniSONE  10 mg Oral Q breakfast   senna-docusate  2 tablet Oral BID   sodium chloride flush  3 mL Intravenous Q12H   tamsulosin  0.4 mg Oral QHS   torsemide  60 mg Oral Daily   acetaminophen **OR** acetaminophen, heparin sodium (porcine), ondansetron **OR**  ondansetron (ZOFRAN) IV, oxyCODONE, polyethylene glycol, simethicone  Assessment/ Plan:  [name] is a 67 y.o.  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  He was admitted to [facility] on 04/17/2023 for Right leg pain [M79.604] Cellulitis of right leg [L03.115] Cellulitis of right lower extremity [L03.115]  [facility]  End Stage Renal Disease: requiring four days a week for volume control -Next treatment scheduled for Wednesday  Hypertension with chronic kidney disease:  -Agree with holding amlodipine due to low blood pressure. - Blood pressure stable today -Torsemide 60 mg p.o. daily for volume control. -Can consider midodrine if blood pressure remains consistently low.  Anemia of chronic kidney disease: hemoglobin, 12.5 on 04/24/23. Holding ESA.  Patient has a history of metastatic prostate cancer  Secondary Hyperparathyroidism: not currently phosphate binders. Calcium and phosphorus acceptable.  5.  Cellulitis, right lower leg.  Completed  IV Rocephin.     LOS: 7   9/3/20243:07 PM

## 2023-04-25 NOTE — Progress Notes (Signed)
PHARMACY NOTE:  ANTIMICROBIAL RENAL DOSAGE ADJUSTMENT  Current antimicrobial regimen includes a mismatch between antimicrobial dosage and estimated renal function.  As per policy approved by the Pharmacy & Therapeutics and Medical Executive Committees, the antimicrobial dosage will be adjusted accordingly.  Current antimicrobial dosage:  Keflex 500mg  3 times daily   Indication: cellulitis   Renal Function:  Estimated Creatinine Clearance: 12.9 mL/min (A) (by C-G formula based on SCr of 6.28 mg/dL (H)). [x]      On intermittent HD, scheduled:     Antimicrobial dosage has been changed to:  Keflex every 24 hours, plus Keflex 500mg  after HD on HD days.    Thank you for allowing pharmacy to be a part of this patient's care.  Gardner Candle, PharmD, BCPS Clinical Pharmacist 04/25/2023 11:23 AM

## 2023-04-25 NOTE — TOC Transition Note (Signed)
Transition of Care [facility]) - CM/SW Discharge Note   Patient Details  Name: Micheal Aguirre MRN: [id number] Date of Birth: [birth year]  Transition of [facility]) CM/SW Contact:  Margarito Liner, LCSW Phone Number: 04/25/2023, 12:26 PM   Clinical Narrative:   Patient has orders to discharge to [facility] SNF today. RN has already called report. SNF admissions coordinator confirmed they are able to give HD on Mondays, Wednesdays, Thursdays, and Fridays. EMS transport has been set up for 1:00. SNF will call daughter when he arrives per daughter request. No further concerns. CSW signing off.  Final next level of care: Skilled Nursing Facility Barriers to Discharge: Barriers Resolved   Patient Goals and CMS Choice   Choice offered to / list presented to : Adult Children  Discharge Placement     Existing PASRR number confirmed : 04/20/23          Patient chooses bed at: Other - please specify in the comment section below: ([facility]) Patient to be transferred to facility by: EMS Name of family member notified: Destin Jonasson Patient and family notified of of transfer: 04/25/23  Discharge Plan and Services Additional resources added to the After Visit Summary for                                       Social Determinants of Health (SDOH) Interventions SDOH Screenings   Food Insecurity: No Food Insecurity (04/17/2023)  Housing: Low Risk  (04/17/2023)  Transportation Needs: No Transportation Needs (04/17/2023)  Utilities: Not At Risk (04/17/2023)  Tobacco Use: Medium Risk (04/17/2023)     Readmission Risk Interventions    04/20/2023   11:32 AM  Readmission Risk Prevention Plan  Transportation Screening Complete  Medication Review (RN Care Manager) Complete  HRI or Home Care Consult Complete  SW Recovery Care/Counseling Consult Complete  Palliative Care Screening Complete  Skilled Nursing Facility Not Applicable

## 2023-05-01 ENCOUNTER — Inpatient Hospital Stay
Admission: EM | Admit: 2023-05-01 | Discharge: 2023-05-23 | DRG: 871 | Disposition: E | Payer: Medicare HMO | Source: Skilled Nursing Facility | Attending: Internal Medicine | Admitting: Internal Medicine

## 2023-05-01 ENCOUNTER — Emergency Department: Payer: Medicare HMO

## 2023-05-01 ENCOUNTER — Inpatient Hospital Stay: Payer: Medicare HMO

## 2023-05-01 ENCOUNTER — Other Ambulatory Visit: Payer: Self-pay

## 2023-05-01 DIAGNOSIS — E11649 Type 2 diabetes mellitus with hypoglycemia without coma: Secondary | ICD-10-CM | POA: Diagnosis present

## 2023-05-01 DIAGNOSIS — G928 Other toxic encephalopathy: Secondary | ICD-10-CM | POA: Diagnosis present

## 2023-05-01 DIAGNOSIS — Z79899 Other long term (current) drug therapy: Secondary | ICD-10-CM

## 2023-05-01 DIAGNOSIS — Z7952 Long term (current) use of systemic steroids: Secondary | ICD-10-CM

## 2023-05-01 DIAGNOSIS — F0390 Unspecified dementia without behavioral disturbance: Secondary | ICD-10-CM | POA: Diagnosis present

## 2023-05-01 DIAGNOSIS — E872 Acidosis, unspecified: Secondary | ICD-10-CM | POA: Diagnosis present

## 2023-05-01 DIAGNOSIS — C779 Secondary and unspecified malignant neoplasm of lymph node, unspecified: Secondary | ICD-10-CM | POA: Diagnosis present

## 2023-05-01 DIAGNOSIS — E871 Hypo-osmolality and hyponatremia: Secondary | ICD-10-CM | POA: Diagnosis present

## 2023-05-01 DIAGNOSIS — L03115 Cellulitis of right lower limb: Secondary | ICD-10-CM | POA: Diagnosis present

## 2023-05-01 DIAGNOSIS — I6381 Other cerebral infarction due to occlusion or stenosis of small artery: Secondary | ICD-10-CM | POA: Diagnosis present

## 2023-05-01 DIAGNOSIS — C61 Malignant neoplasm of prostate: Secondary | ICD-10-CM | POA: Diagnosis present

## 2023-05-01 DIAGNOSIS — K7200 Acute and subacute hepatic failure without coma: Secondary | ICD-10-CM | POA: Diagnosis present

## 2023-05-01 DIAGNOSIS — Z515 Encounter for palliative care: Secondary | ICD-10-CM | POA: Diagnosis not present

## 2023-05-01 DIAGNOSIS — R6521 Severe sepsis with septic shock: Secondary | ICD-10-CM | POA: Diagnosis present

## 2023-05-01 DIAGNOSIS — A419 Sepsis, unspecified organism: Principal | ICD-10-CM | POA: Diagnosis present

## 2023-05-01 DIAGNOSIS — Z1152 Encounter for screening for COVID-19: Secondary | ICD-10-CM

## 2023-05-01 DIAGNOSIS — C7951 Secondary malignant neoplasm of bone: Secondary | ICD-10-CM | POA: Diagnosis present

## 2023-05-01 DIAGNOSIS — Z992 Dependence on renal dialysis: Secondary | ICD-10-CM

## 2023-05-01 DIAGNOSIS — Z7189 Other specified counseling: Secondary | ICD-10-CM | POA: Diagnosis not present

## 2023-05-01 DIAGNOSIS — I5042 Chronic combined systolic (congestive) and diastolic (congestive) heart failure: Secondary | ICD-10-CM | POA: Diagnosis present

## 2023-05-01 DIAGNOSIS — Z7901 Long term (current) use of anticoagulants: Secondary | ICD-10-CM

## 2023-05-01 DIAGNOSIS — I132 Hypertensive heart and chronic kidney disease with heart failure and with stage 5 chronic kidney disease, or end stage renal disease: Secondary | ICD-10-CM | POA: Diagnosis present

## 2023-05-01 DIAGNOSIS — I700 Atherosclerosis of aorta: Secondary | ICD-10-CM | POA: Diagnosis present

## 2023-05-01 DIAGNOSIS — D631 Anemia in chronic kidney disease: Secondary | ICD-10-CM | POA: Diagnosis present

## 2023-05-01 DIAGNOSIS — N2581 Secondary hyperparathyroidism of renal origin: Secondary | ICD-10-CM | POA: Diagnosis present

## 2023-05-01 DIAGNOSIS — Z66 Do not resuscitate: Secondary | ICD-10-CM | POA: Diagnosis present

## 2023-05-01 DIAGNOSIS — N186 End stage renal disease: Secondary | ICD-10-CM | POA: Diagnosis present

## 2023-05-01 DIAGNOSIS — Z87891 Personal history of nicotine dependence: Secondary | ICD-10-CM

## 2023-05-01 DIAGNOSIS — E1122 Type 2 diabetes mellitus with diabetic chronic kidney disease: Secondary | ICD-10-CM | POA: Diagnosis present

## 2023-05-01 DIAGNOSIS — R6520 Severe sepsis without septic shock: Secondary | ICD-10-CM | POA: Diagnosis not present

## 2023-05-01 DIAGNOSIS — Z888 Allergy status to other drugs, medicaments and biological substances status: Secondary | ICD-10-CM

## 2023-05-01 DIAGNOSIS — R7989 Other specified abnormal findings of blood chemistry: Secondary | ICD-10-CM

## 2023-05-01 DIAGNOSIS — I4891 Unspecified atrial fibrillation: Secondary | ICD-10-CM | POA: Diagnosis present

## 2023-05-01 DIAGNOSIS — I255 Ischemic cardiomyopathy: Secondary | ICD-10-CM | POA: Diagnosis present

## 2023-05-01 DIAGNOSIS — Z8249 Family history of ischemic heart disease and other diseases of the circulatory system: Secondary | ICD-10-CM

## 2023-05-01 DIAGNOSIS — R4182 Altered mental status, unspecified: Secondary | ICD-10-CM | POA: Diagnosis present

## 2023-05-01 DIAGNOSIS — I2510 Atherosclerotic heart disease of native coronary artery without angina pectoris: Secondary | ICD-10-CM | POA: Diagnosis present

## 2023-05-01 DIAGNOSIS — E7800 Pure hypercholesterolemia, unspecified: Secondary | ICD-10-CM | POA: Diagnosis present

## 2023-05-01 DIAGNOSIS — E875 Hyperkalemia: Secondary | ICD-10-CM | POA: Diagnosis present

## 2023-05-01 DIAGNOSIS — R680 Hypothermia, not associated with low environmental temperature: Secondary | ICD-10-CM | POA: Diagnosis present

## 2023-05-01 DIAGNOSIS — E162 Hypoglycemia, unspecified: Secondary | ICD-10-CM

## 2023-05-01 DIAGNOSIS — Z951 Presence of aortocoronary bypass graft: Secondary | ICD-10-CM

## 2023-05-01 DIAGNOSIS — C787 Secondary malignant neoplasm of liver and intrahepatic bile duct: Secondary | ICD-10-CM | POA: Diagnosis present

## 2023-05-01 DIAGNOSIS — T68XXXA Hypothermia, initial encounter: Secondary | ICD-10-CM

## 2023-05-01 LAB — GASTROINTESTINAL PANEL BY PCR, STOOL (REPLACES STOOL CULTURE)

## 2023-05-01 LAB — C DIFFICILE QUICK SCREEN W PCR REFLEX
C Diff antigen: NEGATIVE
C Diff interpretation: NOT DETECTED
C Diff toxin: NEGATIVE

## 2023-05-01 LAB — CBC
HCT: 33.2 % — ABNORMAL LOW (ref 39.0–52.0)
Hemoglobin: 11.4 g/dL — ABNORMAL LOW (ref 13.0–17.0)
MCH: 33.3 pg (ref 26.0–34.0)
MCHC: 34.3 g/dL (ref 30.0–36.0)
MCV: 97.1 fL (ref 80.0–100.0)
Platelets: 71 10*3/uL — ABNORMAL LOW (ref 150–400)
RBC: 3.42 MIL/uL — ABNORMAL LOW (ref 4.22–5.81)
RDW: 17.7 % — ABNORMAL HIGH (ref 11.5–15.5)
WBC: 9.5 10*3/uL (ref 4.0–10.5)
nRBC: 6.7 % — ABNORMAL HIGH (ref 0.0–0.2)

## 2023-05-01 LAB — BLOOD GAS, VENOUS
Acid-base deficit: 7.8 mmol/L — ABNORMAL HIGH (ref 0.0–2.0)
Bicarbonate: 17.6 mmol/L — ABNORMAL LOW (ref 20.0–28.0)
O2 Saturation: 52.3 %
Patient temperature: 37
pCO2, Ven: 35 mmHg — ABNORMAL LOW (ref 44–60)
pH, Ven: 7.31 (ref 7.25–7.43)
pO2, Ven: 34 mmHg (ref 32–45)

## 2023-05-01 LAB — CBG MONITORING, ED
Glucose-Capillary: 60 mg/dL — ABNORMAL LOW (ref 70–99)
Glucose-Capillary: 128 mg/dL — ABNORMAL HIGH (ref 70–99)

## 2023-05-01 LAB — COMPREHENSIVE METABOLIC PANEL
ALT: 808 U/L — ABNORMAL HIGH (ref 0–44)
AST: 1892 U/L — ABNORMAL HIGH (ref 15–41)
Albumin: 2.3 g/dL — ABNORMAL LOW (ref 3.5–5.0)
Alkaline Phosphatase: 403 U/L — ABNORMAL HIGH (ref 38–126)
Anion gap: 22 — ABNORMAL HIGH (ref 5–15)
BUN: 75 mg/dL — ABNORMAL HIGH (ref 8–23)
CO2: 18 mmol/L — ABNORMAL LOW (ref 22–32)
Calcium: 7.1 mg/dL — ABNORMAL LOW (ref 8.9–10.3)
Chloride: 90 mmol/L — ABNORMAL LOW (ref 98–111)
Creatinine, Ser: 6.68 mg/dL — ABNORMAL HIGH (ref 0.61–1.24)
GFR, Estimated: 8 mL/min — ABNORMAL LOW (ref >60)
Glucose, Bld: 182 mg/dL — ABNORMAL HIGH (ref 70–99)
Potassium: 7.4 mmol/L (ref 3.5–5.1)
Sodium: 130 mmol/L — ABNORMAL LOW (ref 135–145)
Total Bilirubin: 3 mg/dL — ABNORMAL HIGH (ref 0.3–1.2)
Total Protein: 5.7 g/dL — ABNORMAL LOW (ref 6.5–8.1)

## 2023-05-01 LAB — CBC WITH DIFFERENTIAL/PLATELET
Abs Immature Granulocytes: 0.14 10*3/uL — ABNORMAL HIGH (ref 0.00–0.07)
Basophils Absolute: 0 10*3/uL (ref 0.0–0.1)
Basophils Relative: 0 %
Eosinophils Absolute: 0 10*3/uL (ref 0.0–0.5)
Eosinophils Relative: 0 %
HCT: 36 % — ABNORMAL LOW (ref 39.0–52.0)
Hemoglobin: 11.7 g/dL — ABNORMAL LOW (ref 13.0–17.0)
Immature Granulocytes: 2 %
Lymphocytes Relative: 0 %
Lymphs Abs: 0 10*3/uL — ABNORMAL LOW (ref 0.7–4.0)
MCH: 33.1 pg (ref 26.0–34.0)
MCHC: 32.5 g/dL (ref 30.0–36.0)
MCV: 101.7 fL — ABNORMAL HIGH (ref 80.0–100.0)
Monocytes Absolute: 0.4 10*3/uL (ref 0.1–1.0)
Monocytes Relative: 4 %
Neutro Abs: 8.8 10*3/uL — ABNORMAL HIGH (ref 1.7–7.7)
Neutrophils Relative %: 94 %
Platelets: 77 10*3/uL — ABNORMAL LOW (ref 150–400)
RBC: 3.54 MIL/uL — ABNORMAL LOW (ref 4.22–5.81)
RDW: 17.5 % — ABNORMAL HIGH (ref 11.5–15.5)
WBC: 9.4 10*3/uL (ref 4.0–10.5)
nRBC: 12.3 % — ABNORMAL HIGH (ref 0.0–0.2)

## 2023-05-01 LAB — FIBRINOGEN: Fibrinogen: 451 mg/dL (ref 210–475)

## 2023-05-01 LAB — RENAL FUNCTION PANEL
Albumin: 1.9 g/dL — ABNORMAL LOW (ref 3.5–5.0)
Anion gap: 20 — ABNORMAL HIGH (ref 5–15)
BUN: 65 mg/dL — ABNORMAL HIGH (ref 8–23)
CO2: 21 mmol/L — ABNORMAL LOW (ref 22–32)
Calcium: 7.5 mg/dL — ABNORMAL LOW (ref 8.9–10.3)
Chloride: 92 mmol/L — ABNORMAL LOW (ref 98–111)
Creatinine, Ser: 5.59 mg/dL — ABNORMAL HIGH (ref 0.61–1.24)
GFR, Estimated: 10 mL/min — ABNORMAL LOW (ref >60)
Glucose, Bld: 165 mg/dL — ABNORMAL HIGH (ref 70–99)
Phosphorus: 5.9 mg/dL — ABNORMAL HIGH (ref 2.5–4.6)
Potassium: 5.1 mmol/L (ref 3.5–5.1)
Sodium: 133 mmol/L — ABNORMAL LOW (ref 135–145)

## 2023-05-01 LAB — MRSA NEXT GEN BY PCR, NASAL: MRSA by PCR Next Gen: NOT DETECTED

## 2023-05-01 LAB — RESP PANEL BY RT-PCR (RSV, FLU A&B, COVID)  RVPGX2
Influenza A by PCR: NEGATIVE
Influenza B by PCR: NEGATIVE
Resp Syncytial Virus by PCR: NEGATIVE
SARS Coronavirus 2 by RT PCR: NEGATIVE

## 2023-05-01 LAB — BASIC METABOLIC PANEL
Anion gap: 20 — ABNORMAL HIGH (ref 5–15)
BUN: 70 mg/dL — ABNORMAL HIGH (ref 8–23)
CO2: 21 mmol/L — ABNORMAL LOW (ref 22–32)
Calcium: 7.1 mg/dL — ABNORMAL LOW (ref 8.9–10.3)
Chloride: 91 mmol/L — ABNORMAL LOW (ref 98–111)
Creatinine, Ser: 6.31 mg/dL — ABNORMAL HIGH (ref 0.61–1.24)
GFR, Estimated: 9 mL/min — ABNORMAL LOW (ref >60)
Glucose, Bld: 169 mg/dL — ABNORMAL HIGH (ref 70–99)
Potassium: 5.3 mmol/L — ABNORMAL HIGH (ref 3.5–5.1)
Sodium: 132 mmol/L — ABNORMAL LOW (ref 135–145)

## 2023-05-01 LAB — GLUCOSE, CAPILLARY
Glucose-Capillary: 107 mg/dL — ABNORMAL HIGH (ref 70–99)
Glucose-Capillary: 139 mg/dL — ABNORMAL HIGH (ref 70–99)
Glucose-Capillary: 157 mg/dL — ABNORMAL HIGH (ref 70–99)

## 2023-05-01 LAB — HEMOGLOBIN A1C
Hgb A1c MFr Bld: 6.1 % — ABNORMAL HIGH (ref 4.8–5.6)
Mean Plasma Glucose: 128.37 mg/dL

## 2023-05-01 LAB — PROTIME-INR
INR: 3.7 — ABNORMAL HIGH (ref 0.8–1.2)
Prothrombin Time: 37 s — ABNORMAL HIGH (ref 11.4–15.2)

## 2023-05-01 LAB — APTT: aPTT: 86 s — ABNORMAL HIGH (ref 24–36)

## 2023-05-01 LAB — LACTIC ACID, PLASMA
Lactic Acid, Venous: 4.8 mmol/L (ref 0.5–1.9)
Lactic Acid, Venous: 5.3 mmol/L (ref 0.5–1.9)
Lactic Acid, Venous: 3.1 mmol/L (ref 0.5–1.9)
Lactic Acid, Venous: 2.2 mmol/L (ref 0.5–1.9)

## 2023-05-01 LAB — PSA: Prostatic Specific Antigen: 12.13 ng/mL — ABNORMAL HIGH (ref 0.00–4.00)

## 2023-05-01 LAB — ETHANOL: Alcohol, Ethyl (B): <10 mg/dL (ref <10)

## 2023-05-01 LAB — D-DIMER, QUANTITATIVE: D-Dimer, Quant: 13.8 ug{FEU}/mL — ABNORMAL HIGH (ref 0.00–0.50)

## 2023-05-01 LAB — PROCALCITONIN: Procalcitonin: 79.84 ng/mL

## 2023-05-01 MED ORDER — PRISMASOL BGK 2/3.5 32-2-3.5 MEQ/L EC SOLN: Status: DC

## 2023-05-01 MED ORDER — PIPERACILLIN-TAZOBACTAM 3.375 G IVPB
3.3750 g | Freq: Three times a day (TID) | INTRAVENOUS | Status: AC
  Administered 2023-05-01 – 2023-05-03 (×7): 3.375 g via INTRAVENOUS
  Filled 2023-05-01 (×7): qty 50

## 2023-05-01 MED ORDER — DOCUSATE SODIUM 100 MG PO CAPS: 100.0000 mg | ORAL_CAPSULE | Freq: Two times a day (BID) | ORAL | Status: DC | PRN

## 2023-05-01 MED ORDER — MIDAZOLAM HCL 2 MG/2ML IJ SOLN
INTRAMUSCULAR | Status: AC
  Administered 2023-05-01: 1 mg
  Filled 2023-05-01: qty 2

## 2023-05-01 MED ORDER — VANCOMYCIN HCL 2000 MG/400ML IV SOLN
2000.0000 mg | Freq: Once | INTRAVENOUS | Status: AC
  Administered 2023-05-01: 2000 mg via INTRAVENOUS
  Filled 2023-05-01 (×2): qty 400

## 2023-05-01 MED ORDER — THROMBIN 20000 UNITS EX KIT
20000.0000 [IU] | PACK | Freq: Once | CUTANEOUS | Status: AC
  Administered 2023-05-02: 20000 [IU] via TOPICAL
  Filled 2023-05-01: qty 1

## 2023-05-01 MED ORDER — ALBUTEROL SULFATE (2.5 MG/3ML) 0.083% IN NEBU
10.0000 mg | INHALATION_SOLUTION | Freq: Once | RESPIRATORY_TRACT | Status: AC
  Administered 2023-05-01: 10 mg via RESPIRATORY_TRACT
  Filled 2023-05-01: qty 12

## 2023-05-01 MED ORDER — PANTOPRAZOLE SODIUM 40 MG IV SOLR
40.0000 mg | Freq: Two times a day (BID) | INTRAVENOUS | Status: DC
  Administered 2023-05-01 – 2023-05-03 (×6): 40 mg via INTRAVENOUS
  Filled 2023-05-01 (×6): qty 10

## 2023-05-01 MED ORDER — VASOPRESSIN 20 UNITS/100 ML INFUSION FOR SHOCK
0.0000 [IU]/min | INTRAVENOUS | Status: DC
  Administered 2023-05-01 – 2023-05-03 (×6): 0.03 [IU]/min via INTRAVENOUS
  Filled 2023-05-01 (×7): qty 100

## 2023-05-01 MED ORDER — NEPRO/CARBSTEADY PO LIQD: 237.0000 mL | Freq: Three times a day (TID) | ORAL | Status: DC

## 2023-05-01 MED ORDER — MIDAZOLAM HCL 2 MG/2ML IJ SOLN
1.0000 mg | INTRAMUSCULAR | Status: AC
  Administered 2023-05-01: 1 mg via INTRAVENOUS

## 2023-05-01 MED ORDER — INSULIN ASPART 100 UNIT/ML IJ SOLN
0.0000 [IU] | INTRAMUSCULAR | Status: DC
  Administered 2023-05-01 – 2023-05-02 (×2): 1 [IU] via SUBCUTANEOUS
  Filled 2023-05-01 (×2): qty 1

## 2023-05-01 MED ORDER — FUROSEMIDE 10 MG/ML IJ SOLN
40.0000 mg | Freq: Once | INTRAMUSCULAR | Status: AC
  Administered 2023-05-01: 40 mg via INTRAVENOUS
  Filled 2023-05-01: qty 4

## 2023-05-01 MED ORDER — SODIUM BICARBONATE 8.4 % IV SOLN
INTRAVENOUS | Status: AC
  Filled 2023-05-01: qty 50

## 2023-05-01 MED ORDER — MIDAZOLAM HCL 2 MG/2ML IJ SOLN
2.0000 mg | INTRAMUSCULAR | Status: DC | PRN
  Administered 2023-05-02 – 2023-05-03 (×3): 1 mg via INTRAVENOUS
  Administered 2023-05-04: 2 mg via INTRAVENOUS
  Filled 2023-05-01 (×5): qty 2

## 2023-05-01 MED ORDER — NOREPINEPHRINE 4 MG/250ML-% IV SOLN: 2.0000 ug/min | INTRAVENOUS | Status: DC

## 2023-05-01 MED ORDER — RENA-VITE PO TABS: 1.0000 | ORAL_TABLET | Freq: Every day | ORAL | Status: DC

## 2023-05-01 MED ORDER — SODIUM CHLORIDE 0.9 % IV SOLN
250.0000 mL | INTRAVENOUS | Status: DC
  Administered 2023-05-01 – 2023-05-04 (×5): 250 mL via INTRAVENOUS

## 2023-05-01 MED ORDER — HYDROCORTISONE SOD SUC (PF) 100 MG IJ SOLR
100.0000 mg | Freq: Three times a day (TID) | INTRAMUSCULAR | Status: DC
  Administered 2023-05-01 – 2023-05-02 (×3): 100 mg via INTRAVENOUS
  Filled 2023-05-01 (×3): qty 2

## 2023-05-01 MED ORDER — INSULIN ASPART 100 UNIT/ML IV SOLN
10.0000 [IU] | Freq: Once | INTRAVENOUS | Status: AC
  Administered 2023-05-01: 10 [IU] via INTRAVENOUS
  Filled 2023-05-01: qty 0.1

## 2023-05-01 MED ORDER — VANCOMYCIN HCL IN DEXTROSE 1-5 GM/200ML-% IV SOLN
1000.0000 mg | INTRAVENOUS | Status: AC
  Administered 2023-05-02: 1000 mg via INTRAVENOUS
  Filled 2023-05-01 (×2): qty 200

## 2023-05-01 MED ORDER — VANCOMYCIN HCL IN DEXTROSE 1-5 GM/200ML-% IV SOLN: 1000.0000 mg | Freq: Once | INTRAVENOUS | Status: DC

## 2023-05-01 MED ORDER — DEXTROSE 50 % IV SOLN
12.5000 g | Freq: Once | INTRAVENOUS | Status: AC
  Administered 2023-05-01: 12.5 g via INTRAVENOUS
  Filled 2023-05-01: qty 50

## 2023-05-01 MED ORDER — LACTATED RINGERS IV BOLUS
1000.0000 mL | Freq: Once | INTRAVENOUS | Status: AC
  Administered 2023-05-01: 1000 mL via INTRAVENOUS

## 2023-05-01 MED ORDER — METRONIDAZOLE 500 MG/100ML IV SOLN
500.0000 mg | Freq: Once | INTRAVENOUS | Status: AC
  Administered 2023-05-01: 500 mg via INTRAVENOUS
  Filled 2023-05-01: qty 100

## 2023-05-01 MED ORDER — SODIUM ZIRCONIUM CYCLOSILICATE 5 G PO PACK
10.0000 g | PACK | Freq: Once | ORAL | Status: DC
  Filled 2023-05-01: qty 1

## 2023-05-01 MED ORDER — ORAL CARE MOUTH RINSE: 15.0000 mL | OROMUCOSAL | Status: DC | PRN

## 2023-05-01 MED ORDER — AMIODARONE IV BOLUS ONLY 150 MG/100ML
INTRAVENOUS | Status: AC
  Administered 2023-05-01: 150 mg
  Filled 2023-05-01: qty 100

## 2023-05-01 MED ORDER — NOREPINEPHRINE 4 MG/250ML-% IV SOLN
0.0000 ug/min | INTRAVENOUS | Status: DC
  Administered 2023-05-01: 2 ug/min via INTRAVENOUS
  Filled 2023-05-01: qty 250

## 2023-05-01 MED ORDER — FENTANYL CITRATE PF 50 MCG/ML IJ SOSY
12.5000 ug | PREFILLED_SYRINGE | INTRAMUSCULAR | Status: DC | PRN
  Administered 2023-05-01 – 2023-05-02 (×3): 12.5 ug via INTRAVENOUS
  Filled 2023-05-01 (×4): qty 1

## 2023-05-01 MED ORDER — CHLORHEXIDINE GLUCONATE CLOTH 2 % EX PADS
6.0000 | MEDICATED_PAD | Freq: Every day | CUTANEOUS | Status: DC
  Administered 2023-05-02: 6 via TOPICAL

## 2023-05-01 MED ORDER — DEXTROSE 50 % IV SOLN
1.0000 | Freq: Once | INTRAVENOUS | Status: AC
  Administered 2023-05-01: 50 mL via INTRAVENOUS
  Filled 2023-05-01: qty 50

## 2023-05-01 MED ORDER — VITAMIN C 500 MG PO TABS: 500.0000 mg | ORAL_TABLET | Freq: Two times a day (BID) | ORAL | Status: DC

## 2023-05-01 MED ORDER — CALCIUM GLUCONATE-NACL 1-0.675 GM/50ML-% IV SOLN
1.0000 g | Freq: Once | INTRAVENOUS | Status: AC
  Administered 2023-05-01: 1000 mg via INTRAVENOUS
  Filled 2023-05-01: qty 50

## 2023-05-01 MED ORDER — NOREPINEPHRINE 16 MG/250ML-% IV SOLN
0.0000 ug/min | INTRAVENOUS | Status: DC
  Administered 2023-05-01: 15 ug/min via INTRAVENOUS
  Administered 2023-05-02: 11 ug/min via INTRAVENOUS
  Administered 2023-05-03: 9 ug/min via INTRAVENOUS
  Administered 2023-05-04: 12 ug/min via INTRAVENOUS
  Filled 2023-05-01 (×4): qty 250

## 2023-05-01 MED ORDER — HEPARIN SODIUM (PORCINE) 5000 UNIT/ML IJ SOLN
5000.0000 [IU] | Freq: Two times a day (BID) | INTRAMUSCULAR | Status: DC
  Administered 2023-05-01: 5000 [IU] via SUBCUTANEOUS
  Filled 2023-05-01 (×2): qty 1

## 2023-05-01 MED ORDER — SODIUM BICARBONATE 8.4 % IV SOLN
50.0000 meq | Freq: Once | INTRAVENOUS | Status: AC
  Administered 2023-05-01: 50 meq via INTRAVENOUS

## 2023-05-01 MED ORDER — POLYETHYLENE GLYCOL 3350 17 G PO PACK: 17.0000 g | PACK | Freq: Every day | ORAL | Status: DC | PRN

## 2023-05-01 MED ORDER — SODIUM CHLORIDE 0.9 % IV SOLN
2.0000 g | Freq: Once | INTRAVENOUS | Status: AC
  Administered 2023-05-01: 2 g via INTRAVENOUS
  Filled 2023-05-01: qty 12.5

## 2023-05-01 MED ORDER — SODIUM BICARBONATE 8.4 % IV SOLN
50.0000 meq | Freq: Once | INTRAVENOUS | Status: AC
  Administered 2023-05-01: 50 meq via INTRAVENOUS
  Filled 2023-05-01: qty 50

## 2023-05-01 MED ORDER — VITAMIN K1 10 MG/ML IJ SOLN
10.0000 mg | Freq: Once | INTRAVENOUS | Status: AC
  Administered 2023-05-01: 10 mg via INTRAVENOUS
  Filled 2023-05-01: qty 1

## 2023-05-01 MED ORDER — THROMBIN 20000 UNITS EX KIT
20000.0000 [IU] | PACK | Freq: Once | CUTANEOUS | Status: DC
  Filled 2023-05-01: qty 1

## 2023-05-01 NOTE — Progress Notes (Signed)
Updated pts daughter Josia Litwiller regarding pts condition and current plan of care. All questions were answered and she was appreciative to receive an update.  She stated she will arrive at the hospital today around 1400  Zada Girt, [facility]  Pulmonary/Critical Care Pager [phone] (please enter 7 digits) PCCM Consult Pager [phone] (please enter 7 digits)

## 2023-05-01 NOTE — Consult Note (Addendum)
Consultation Note Date: 02-May-2023   Patient Name: RAMOND BOLEYN  DOB: 1956/07/05  MRN: [id number]  Age / Sex: 67 y.o., male  PCP: [facility] Referring Physician: Erin Fulling, MD  Reason for Consultation: Establishing goals of care  HPI/Patient Profile:  Kamaron Brymer is a 66 y.o. 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   Clinical Assessment and Goals of Care: Consult placed by CCM.  Spoke with CCM regarding patient's status and their current concerns.  Notes and labs reviewed for this hospitalization.  Notes reviewed in care everywhere including cardiology and oncology notes.  Into see patient.  He is currently resting in bed with eyes closed at this time.  No family at bedside.  Per staff daughter is flying in to the area and should be here this afternoon.  Will attempt to follow-up at a later time.  ADDENDUM: Returned to bedside and daughter was present.  Daughter states she lives in [place].  She states her father lives in a facility and she is able to call and check on him and see him intermittently.  She discusses that prior to hospitalization in July her father was able to complete his ADLs and do his laundry.  CCM and PMT had a joint conversation with daughter moving forward.  We discussed his status and prognosis.  Daughter states she remembers her father completing advanced directives but is unsure of the information contained in the advanced directive.  She states she will look for this.  PMT will continue to follow.  SUMMARY OF RECOMMENDATIONS   PMT will  follow   Prognosis:  Poor      Primary Diagnoses: Present on Admission:  Severe sepsis with acute organ dysfunction (HCC)   I have reviewed the medical record, interviewed the patient and family, and examined the patient. The following aspects are pertinent.  Past Medical History:  Diagnosis Date   Diabetes mellitus without complication (HCC)    ESRD (end stage renal disease) (HCC)    Hypercholesteremia    Hypertension    Renal disorder    Social History   Socioeconomic History   Marital status: Divorced    Spouse name: Not on file   Number of children: Not on file   Years of education: Not on file   Highest education level: Not on file  Occupational History   Not on file  Tobacco Use   Smoking status: Former    Types: Cigarettes   Smokeless tobacco: Never  Vaping Use   Vaping status: Never Used  Substance and Sexual Activity   Alcohol use: Not Currently   Drug use: Never   Sexual activity: Not on file  Other Topics Concern   Not on file  Social History Narrative  Not on file   Social Determinants of Health   Financial Resource Strain: Not on file  Food Insecurity: Patient Unable To Answer (05-22-23)   Hunger Vital Sign    Worried About Running Out of Food in the Last Year: Patient unable to answer    Ran Out of Food in the Last Year: Patient unable to answer  Transportation Needs: Patient Unable To Answer (2023-05-22)   PRAPARE - Administrator, Civil Service (Medical): Patient unable to answer    Lack of Transportation (Non-Medical): Patient unable to answer  Physical Activity: Not on file  Stress: Not on file  Social Connections: Not on file   Family History  Problem Relation Age of Onset   Heart disease Mother    Heart disease Father    Scheduled Meds:  vitamin C  500 mg Oral BID   [START ON 05/02/2023] feeding supplement (NEPRO CARB STEADY)  237 mL Oral TID BM   heparin injection (subcutaneous)  5,000 Units Subcutaneous Q12H    insulin aspart  0-6 Units Subcutaneous Q4H   multivitamin  1 tablet Per Tube QHS   pantoprazole (PROTONIX) IV  40 mg Intravenous Q12H   sodium zirconium cyclosilicate  10 g Oral Once   Continuous Infusions:  sodium chloride 250 mL (05/22/23 1035)   norepinephrine (LEVOPHED) Adult infusion 18 mcg/min (May 22, 2023 1200)   PrismaSol BGK 2/3.5     PrismaSol BGK 2/3.5     PrismaSol BGK 2/3.5     vasopressin 0.03 Units/min (05/22/2023 1200)   PRN Meds:.docusate sodium, fentaNYL (SUBLIMAZE) injection, midazolam, polyethylene glycol Medications Prior to Admission:  Prior to Admission medications   Medication Sig Start Date End Date Taking? Authorizing Provider  acetaminophen (TYLENOL) 500 MG tablet Take 1,000 mg by mouth every 8 (eight) hours as needed for mild pain or moderate pain.   Yes [provider]  apixaban (ELIQUIS) 2.5 MG TABS tablet Take 1 tablet (2.5 mg total) by mouth 2 (two) times daily. 02/05/22  Yes Evon Slack, PA-C  atorvastatin (LIPITOR) 80 MG tablet Take 80 mg by mouth daily.   Yes [provider]  bisacodyl (DULCOLAX) 5 MG EC tablet Take 10 mg by mouth daily.   Yes [provider]  folic acid (FOLVITE) 1 MG tablet Take 1 mg by mouth daily. 09/07/21  Yes [provider]  gabapentin (NEURONTIN) 100 MG capsule Take 100 mg by mouth at bedtime.   Yes [provider]  hydrOXYzine (ATARAX) 50 MG tablet Take 50 mg by mouth every 8 (eight) hours as needed for anxiety. 04/27/23  Yes [provider]  melatonin 3 MG TABS tablet Take 6 mg by mouth at bedtime.   Yes [provider]  Multiple Vitamins-Minerals (MULTIVITAMIN WITH MINERALS) tablet Take 1 tablet by mouth daily.   Yes [provider]  ondansetron (ZOFRAN) 4 MG tablet Take 4 mg by mouth every 8 (eight) hours as needed for nausea or vomiting.   Yes [provider]  oxyCODONE (OXY IR/ROXICODONE) 5 MG immediate release tablet Take 5 mg by mouth every 8  (eight) hours as needed for severe pain.   Yes [provider]  pantoprazole (PROTONIX) 40 MG tablet Take 40 mg by mouth every 12 (twelve) hours.   Yes [provider]  polyethylene glycol (MIRALAX / GLYCOLAX) 17 g packet Take 17 g by mouth at bedtime.   Yes [provider]  predniSONE (DELTASONE) 10 MG tablet Take 10 mg by mouth daily with breakfast.  Yes [provider]  senna-docusate (SENOKOT-S) 8.6-50 MG tablet Take 2 tablets by mouth 2 (two) times daily.   Yes [provider]  simethicone (MYLICON) 80 MG chewable tablet Chew 80 mg by mouth in the morning and at bedtime.   Yes [provider]  tamsulosin (FLOMAX) 0.4 MG CAPS capsule Take 0.4 mg by mouth at bedtime.   Yes [provider]  torsemide (DEMADEX) 20 MG tablet Take 60 mg by mouth daily.   Yes [provider]   Allergies  Allergen Reactions   Hydralazine     Other reaction(s): Kidney Disorder, Other (See Comments) Concern for drug-induced AIN in 06/2021    Lisinopril Swelling and Other (See Comments)    Angioedema    Bimatoprost     Other reaction(s): Unknown   Empagliflozin Other (See Comments)    UTI Other reaction(s): uti    Review of Systems  Unable to perform ROS   Physical Exam Constitutional:      Comments: Eyes closed  Pulmonary:     Effort: Pulmonary effort is normal.     Vital Signs: BP 107/66   Pulse 78   Temp 97.9 F (36.6 C)   Resp 16   Ht 5\' 9"  (1.753 m)   Wt 94.1 kg   SpO2 100%   BMI 30.64 kg/m          SpO2: SpO2: 100 % O2 Device:SpO2: 100 % O2 Flow Rate: .O2 Flow Rate (L/min): 4 L/min  IO: Intake/output summary:  Intake/Output Summary (Last 24 hours) at 05/10/2023 1250 Last data filed at 05/09/2023 1200 Gross per 24 hour  Intake 2686.88 ml  Output --  Net 2686.88 ml    LBM: Last BM Date : 04/30/2023 Baseline Weight: Weight: 94.1 kg Most recent weight: Weight: 94.1 kg        Signed by: Morton Stall, NP   Please contact Palliative Medicine Team phone at [phone] for questions and concerns.  For individual provider: See Loretha Stapler

## 2023-05-01 NOTE — IPAL (Signed)
Interdisciplinary Goals of Care Family Meeting   Date carried out: 05/15/2023  Location of the meeting: Conference room  Member's involved: Nurse Practitioner and Family Member or next of kin  Durable Power of Attorney or acting medical decision maker: Pts daughter and POA Micheal Aguirre   Discussion: We discussed goals of care for Micheal Aguirre .  Extensive conversation regarding code status.  Following discussion pts daughter decided she WOULD NOT WANT CPR or Chest Compressions, however she has NOT made a decision regarding mechanical intubation at this time.     Code status:   Code Status: Do not attempt resuscitation (DNR) PRE-ARREST INTERVENTIONS DESIRED   Disposition: Continue current acute care  Time spent for the meeting: 15 minutes    Zada Girt, AGNP  Pulmonary/Critical Care Pager [phone] (please enter 7 digits) PCCM Consult Pager [phone] (please enter 7 digits)

## 2023-05-01 NOTE — Progress Notes (Addendum)
Final    NO GROWTH < 12 HOURS Performed at [facility], [address] [place], [place] [address]    Report Status PENDING  Incomplete  Blood Culture (routine x 2)     Status: None (Preliminary result)   Collection Time: 04/24/2023  3:34 AM   Specimen: BLOOD LEFT ARM  Result Value Ref Range Status   Specimen Description BLOOD LEFT ARM  Final   Special Requests   Final    BOTTLES DRAWN AEROBIC AND ANAEROBIC Blood Culture adequate volume    Culture   Final    NO GROWTH < 12 HOURS Performed at [facility], [address] [place], [place] [address]    Report Status PENDING  Incomplete  Gastrointestinal Panel by PCR , Stool     Status: Abnormal   Collection Time: 05/12/2023  3:34 AM   Specimen: Stool  Result Value Ref Range Status   Campylobacter species NOT DETECTED NOT DETECTED Final   Plesimonas shigelloides NOT DETECTED NOT DETECTED Final   Salmonella species NOT DETECTED NOT DETECTED Final   Yersinia enterocolitica NOT DETECTED NOT DETECTED Final   Vibrio species NOT DETECTED NOT DETECTED Final   Vibrio cholerae NOT DETECTED NOT DETECTED Final   Enteroaggregative E coli (EAEC) DETECTED (A) NOT DETECTED Final    Comment: RESULT CALLED TO, READ BACK BY AND VERIFIED WITH: Encarnacion Slates [facility] 05/21/2023 8657 MW    Enteropathogenic E coli (EPEC) NOT DETECTED NOT DETECTED Final   Enterotoxigenic E coli (ETEC) NOT DETECTED NOT DETECTED Final   Shiga like toxin producing E coli (STEC) NOT DETECTED NOT DETECTED Final   Shigella/Enteroinvasive E coli (EIEC) NOT DETECTED NOT DETECTED Final   Cryptosporidium NOT DETECTED NOT DETECTED Final   Cyclospora cayetanensis NOT DETECTED NOT DETECTED Final   Entamoeba histolytica NOT DETECTED NOT DETECTED Final   Giardia lamblia NOT DETECTED NOT DETECTED Final   Adenovirus F40/41 NOT DETECTED NOT DETECTED Final   Astrovirus NOT DETECTED NOT DETECTED Final   Norovirus GI/GII NOT DETECTED NOT DETECTED Final   Rotavirus A NOT DETECTED NOT DETECTED Final   Sapovirus (I, II, IV, and V) NOT DETECTED NOT DETECTED Final    Comment: Performed at [facility], [address] [place], [place] [address]  C Difficile Quick Screen w PCR reflex     Status: None   Collection Time: 04/30/2023  3:34 AM   Specimen: STOOL  Result Value Ref Range Status   C Diff antigen NEGATIVE NEGATIVE Final   C Diff toxin NEGATIVE NEGATIVE Final   C Diff interpretation No C. difficile detected.  Final     Comment: Performed at [facility], [address] [place], [place] [address]    Coagulation Studies: Recent Labs    05/20/2023 0331  LABPROT 37.0*  INR 3.7*    Urinalysis: No results for input(s): "COLORURINE", "LABSPEC", "PHURINE", "GLUCOSEU", "HGBUR", "BILIRUBINUR", "KETONESUR", "PROTEINUR", "UROBILINOGEN", "NITRITE", "LEUKOCYTESUR" in the last 72 hours.  Invalid input(s): "APPERANCEUR"    Imaging: CT CHEST ABDOMEN PELVIS WO CONTRAST  Result Date: 05/18/2023 CLINICAL DATA:  67 year old male with altered mental status, no speech. Possible sepsis. Recently diagnosed with liver masses. EXAM: CT CHEST, ABDOMEN AND PELVIS WITHOUT CONTRAST TECHNIQUE: Multidetector CT imaging of the chest, abdomen and pelvis was performed following the standard protocol without IV contrast. RADIATION DOSE REDUCTION: This exam was performed according to the departmental dose-optimization program which includes automated exposure control, adjustment of the mA and/or kV according to patient size and/or use of iterative  use of iterative reconstruction technique. COMPARISON:  None Available. FINDINGS: Brain: Study is mildly degraded by motion artifact despite repeated imaging attempts. No midline shift, mass effect, or evidence of intracranial mass lesion. No ventriculomegaly. Cerebral volume is probably normal for age. No acute intracranial hemorrhage identified. No cortically based acute infarct identified. There is patchy, moderate bilateral periventricular white matter hypodensity. And there is  asymmetric heterogeneity in the thalami, greater on the left (series 2, image 18) compatible with age indeterminate small vessel infarcts. However, on the coronal view the left thalamus hypodensity has a chronic appearance. No superimposed No cortically based acute infarct identified. Vascular: Calcified atherosclerosis at the skull base. No suspicious intracranial vascular hyperdensity. Skull: Pronounced skull base motion artifact. No acute osseous abnormality is evident. Sinuses/Orbits: Motion artifact. No definite sinus or mastoid opacification. Other: Orbits are obscured by motion. Visualized scalp soft tissues are within normal limits. IMPRESSION: 1. Motion degraded exam. 2. 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 3. No other acute intracranial abnormality. Electronically Signed   By: Odessa Fleming M.D.   On: 05/18/2023 05:20   DG Chest Port 1 View  Result Date: 05/05/2023 CLINICAL DATA:  Questionable sepsis. EXAM: PORTABLE CHEST 1 VIEW COMPARISON:  Portable chest 04/17/2023 FINDINGS: The lungs are expiratory obscuring the lung bases. The visualized lungs are clear. No pleural effusion is seen. 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 Right IJ dialysis catheter again terminates in the right atrium. No new osseous findings. IMPRESSION: Limited study due to expiratory technique. The visualized lungs clear but the lung bases are not seen due to expiration. Stable cardiomegaly and aortic atherosclerosis. Electronically Signed   By: Almira Bar M.D.   On: 04/23/2023 04:12     Medications:    sodium chloride 250 mL (05/22/2023 1035)   norepinephrine (LEVOPHED) Adult infusion 15 mcg/min (04/26/2023 1100)   PrismaSol BGK 2/3.5     PrismaSol BGK 2/3.5     PrismaSol BGK 2/3.5     vancomycin 200 mL/hr at 05/07/2023 1100   vasopressin 0.03 Units/min  (04/26/2023 1100)      heparin injection (subcutaneous)  5,000 Units Subcutaneous Q12H   insulin aspart  0-6 Units Subcutaneous Q4H   pantoprazole (PROTONIX) IV  40 mg Intravenous Q12H   sodium zirconium cyclosilicate  10 g Oral Once   docusate sodium, fentaNYL (SUBLIMAZE) injection, midazolam, polyethylene glycol  Assessment/ Plan:  [name] is a 67 y.o.  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  He was admitted to [facility] on 05/19/2023 for Hyperkalemia [E87.5] Hypoglycemia [E16.2] Elevated liver function tests [R79.89] Cellulitis of right lower extremity [L03.115] Hypothermia, initial encounter [T68.XXXA] Septic shock (HCC) [A41.9, R65.21] Severe sepsis with acute organ dysfunction (HCC) [A41.9, R65.20] Altered mental status, unspecified altered mental status type [R41.82]  [facility]  End Stage Renal Disease with hyperkalemia: requiring four days a week for volume control -Last treatment received on Friday -Potassium 7.4, primary team is ordered shifting measures. - Due to instability, will order CRRT with 2K bath and net even UF.  Hypotensive due to septic shock, suspected from right leg cellulitis.  Remains  [facility]  ROUNDING NOTE   Subjective:   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 He presents tot he ED with altered mental status. He was admitted to [facility] on 05/17/2023 for Hyperkalemia [E87.5] Hypoglycemia [E16.2] Elevated liver function tests [R79.89] Cellulitis of right lower extremity [L03.115] Hypothermia, initial encounter [T68.XXXA] Septic shock (HCC) [A41.9, R65.21] Severe sepsis with acute organ dysfunction (HCC) [A41.9, R65.20] Altered mental status, unspecified altered mental status type [R41.82]  Patient is known to our practice and currently receives outpatient dialysis treatments at [facility] as patient is there for rehab. Last treatment received on Friday. Patient is seen in ICU, alert but remains altered. Screams with light touch. Currently on Levophed.  4 L nasal cannula in place.  Lower extremity edema noted.  Blood pressure is also soft, 88/66 during rounds.  Labs significant for sodium 130, potassium 7.4, serum bicarb 18, BUN 75, creatinine 6.68 with GFR 8, calcium 7.1, anion gap 22, AST 1892, and ALT 808.  Lactic acid 5.3.  D-dimer 13.8.  Chest x-ray negative for acute findings.  CT head degraded by motion but shows indeterminate small vessel disease and a notable left thalamic lacunar infarct.  CT chest abdomen pelvis shows hypodense masses in the liver, seen on CT last month, uncertain if primary versus metastatic liver tumor.  We have been consulted to evaluate dialysis needs with this patient.   Objective:  Vital signs in last 24 hours:  Temp:  [93.5 F (34.2 C)-102.4 F (39.1 C)] 97.9 F (36.6 C) (09/09 1100) Pulse Rate:  [25-114] 89 (09/09 1100) Resp:  [10-30] 14 (09/09  1100) BP: (66-117)/(36-81) 88/53 (09/09 1100) SpO2:  [87 %-100 %] 95 % (09/09 1100) Weight:  [94.1 kg] 94.1 kg (09/09 0313)  Weight change:  Filed Weights   05/14/2023 0313  Weight: 94.1 kg    Intake/Output: I/O last 3 completed shifts: In: 2249.8 [IV Piggyback:2249.8] Out: -    Intake/Output this shift:  Total I/O In: 216.8 [I.V.:109.8; IV Piggyback:107] Out: -   Physical Exam: General: NAD  Head: Normocephalic, atraumatic. Moist oral mucosal membranes  Eyes: Anicteric  Lungs:  Clear to auscultation, normal effort  Heart: Tachycardia  Abdomen:  Soft, nontender  Extremities:  Right LE +  edema  Neurologic: Alert, unable to follow commands  Skin: No lesions  Access: RIJ permcath    Basic Metabolic Panel: Recent Labs  Lab 04/28/2023 0414  NA 130*  K 7.4*  CL 90*  CO2 18*  GLUCOSE 182*  BUN 75*  CREATININE 6.68*  CALCIUM 7.1*    Liver Function Tests: Recent Labs  Lab 05/15/2023 0414  AST 1,892*  ALT 808*  ALKPHOS 403*  BILITOT 3.0*  PROT 5.7*  ALBUMIN 2.3*   No results for input(s): "LIPASE", "AMYLASE" in the last 168 hours.  No results for input(s): "AMMONIA" in the last 168 hours.  CBC: Recent Labs  Lab 04/30/2023 0421  WBC 9.4  NEUTROABS 8.8*  HGB 11.7*  HCT 36.0*  MCV 101.7*  PLT 77*    Cardiac Enzymes: No results for input(s): "CKTOTAL", "CKMB", "CKMBINDEX", "TROPONINI" in the last 168 hours.  BNP: Invalid input(s): "POCBNP"  CBG: Recent Labs  Lab 04/25/23 1221 04/25/23 1226 05/10/2023 0348 05/07/2023 0613 04/27/2023 0713  GLUCAP 128* 109* 60* 128* 107*  use of iterative reconstruction technique. COMPARISON:  None Available. FINDINGS: Brain: Study is mildly degraded by motion artifact despite repeated imaging attempts. No midline shift, mass effect, or evidence of intracranial mass lesion. No ventriculomegaly. Cerebral volume is probably normal for age. No acute intracranial hemorrhage identified. No cortically based acute infarct identified. There is patchy, moderate bilateral periventricular white matter hypodensity. And there is  asymmetric heterogeneity in the thalami, greater on the left (series 2, image 18) compatible with age indeterminate small vessel infarcts. However, on the coronal view the left thalamus hypodensity has a chronic appearance. No superimposed No cortically based acute infarct identified. Vascular: Calcified atherosclerosis at the skull base. No suspicious intracranial vascular hyperdensity. Skull: Pronounced skull base motion artifact. No acute osseous abnormality is evident. Sinuses/Orbits: Motion artifact. No definite sinus or mastoid opacification. Other: Orbits are obscured by motion. Visualized scalp soft tissues are within normal limits. IMPRESSION: 1. Motion degraded exam. 2. 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 3. No other acute intracranial abnormality. Electronically Signed   By: Odessa Fleming M.D.   On: 05/18/2023 05:20   DG Chest Port 1 View  Result Date: 05/05/2023 CLINICAL DATA:  Questionable sepsis. EXAM: PORTABLE CHEST 1 VIEW COMPARISON:  Portable chest 04/17/2023 FINDINGS: The lungs are expiratory obscuring the lung bases. The visualized lungs are clear. No pleural effusion is seen. 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 Right IJ dialysis catheter again terminates in the right atrium. No new osseous findings. IMPRESSION: Limited study due to expiratory technique. The visualized lungs clear but the lung bases are not seen due to expiration. Stable cardiomegaly and aortic atherosclerosis. Electronically Signed   By: Almira Bar M.D.   On: 04/23/2023 04:12     Medications:    sodium chloride 250 mL (05/22/2023 1035)   norepinephrine (LEVOPHED) Adult infusion 15 mcg/min (04/26/2023 1100)   PrismaSol BGK 2/3.5     PrismaSol BGK 2/3.5     PrismaSol BGK 2/3.5     vancomycin 200 mL/hr at 05/07/2023 1100   vasopressin 0.03 Units/min  (04/26/2023 1100)      heparin injection (subcutaneous)  5,000 Units Subcutaneous Q12H   insulin aspart  0-6 Units Subcutaneous Q4H   pantoprazole (PROTONIX) IV  40 mg Intravenous Q12H   sodium zirconium cyclosilicate  10 g Oral Once   docusate sodium, fentaNYL (SUBLIMAZE) injection, midazolam, polyethylene glycol  Assessment/ Plan:  [name] is a 67 y.o.  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  He was admitted to [facility] on 05/19/2023 for Hyperkalemia [E87.5] Hypoglycemia [E16.2] Elevated liver function tests [R79.89] Cellulitis of right lower extremity [L03.115] Hypothermia, initial encounter [T68.XXXA] Septic shock (HCC) [A41.9, R65.21] Severe sepsis with acute organ dysfunction (HCC) [A41.9, R65.20] Altered mental status, unspecified altered mental status type [R41.82]  [facility]  End Stage Renal Disease with hyperkalemia: requiring four days a week for volume control -Last treatment received on Friday -Potassium 7.4, primary team is ordered shifting measures. - Due to instability, will order CRRT with 2K bath and net even UF.  Hypotensive due to septic shock, suspected from right leg cellulitis.  Remains  [facility]  ROUNDING NOTE   Subjective:   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 He presents tot he ED with altered mental status. He was admitted to [facility] on 05/17/2023 for Hyperkalemia [E87.5] Hypoglycemia [E16.2] Elevated liver function tests [R79.89] Cellulitis of right lower extremity [L03.115] Hypothermia, initial encounter [T68.XXXA] Septic shock (HCC) [A41.9, R65.21] Severe sepsis with acute organ dysfunction (HCC) [A41.9, R65.20] Altered mental status, unspecified altered mental status type [R41.82]  Patient is known to our practice and currently receives outpatient dialysis treatments at [facility] as patient is there for rehab. Last treatment received on Friday. Patient is seen in ICU, alert but remains altered. Screams with light touch. Currently on Levophed.  4 L nasal cannula in place.  Lower extremity edema noted.  Blood pressure is also soft, 88/66 during rounds.  Labs significant for sodium 130, potassium 7.4, serum bicarb 18, BUN 75, creatinine 6.68 with GFR 8, calcium 7.1, anion gap 22, AST 1892, and ALT 808.  Lactic acid 5.3.  D-dimer 13.8.  Chest x-ray negative for acute findings.  CT head degraded by motion but shows indeterminate small vessel disease and a notable left thalamic lacunar infarct.  CT chest abdomen pelvis shows hypodense masses in the liver, seen on CT last month, uncertain if primary versus metastatic liver tumor.  We have been consulted to evaluate dialysis needs with this patient.   Objective:  Vital signs in last 24 hours:  Temp:  [93.5 F (34.2 C)-102.4 F (39.1 C)] 97.9 F (36.6 C) (09/09 1100) Pulse Rate:  [25-114] 89 (09/09 1100) Resp:  [10-30] 14 (09/09  1100) BP: (66-117)/(36-81) 88/53 (09/09 1100) SpO2:  [87 %-100 %] 95 % (09/09 1100) Weight:  [94.1 kg] 94.1 kg (09/09 0313)  Weight change:  Filed Weights   05/14/2023 0313  Weight: 94.1 kg    Intake/Output: I/O last 3 completed shifts: In: 2249.8 [IV Piggyback:2249.8] Out: -    Intake/Output this shift:  Total I/O In: 216.8 [I.V.:109.8; IV Piggyback:107] Out: -   Physical Exam: General: NAD  Head: Normocephalic, atraumatic. Moist oral mucosal membranes  Eyes: Anicteric  Lungs:  Clear to auscultation, normal effort  Heart: Tachycardia  Abdomen:  Soft, nontender  Extremities:  Right LE +  edema  Neurologic: Alert, unable to follow commands  Skin: No lesions  Access: RIJ permcath    Basic Metabolic Panel: Recent Labs  Lab 04/28/2023 0414  NA 130*  K 7.4*  CL 90*  CO2 18*  GLUCOSE 182*  BUN 75*  CREATININE 6.68*  CALCIUM 7.1*    Liver Function Tests: Recent Labs  Lab 05/15/2023 0414  AST 1,892*  ALT 808*  ALKPHOS 403*  BILITOT 3.0*  PROT 5.7*  ALBUMIN 2.3*   No results for input(s): "LIPASE", "AMYLASE" in the last 168 hours.  No results for input(s): "AMMONIA" in the last 168 hours.  CBC: Recent Labs  Lab 04/30/2023 0421  WBC 9.4  NEUTROABS 8.8*  HGB 11.7*  HCT 36.0*  MCV 101.7*  PLT 77*    Cardiac Enzymes: No results for input(s): "CKTOTAL", "CKMB", "CKMBINDEX", "TROPONINI" in the last 168 hours.  BNP: Invalid input(s): "POCBNP"  CBG: Recent Labs  Lab 04/25/23 1221 04/25/23 1226 05/10/2023 0348 05/07/2023 0613 04/27/2023 0713  GLUCAP 128* 109* 60* 128* 107*  use of iterative reconstruction technique. COMPARISON:  None Available. FINDINGS: Brain: Study is mildly degraded by motion artifact despite repeated imaging attempts. No midline shift, mass effect, or evidence of intracranial mass lesion. No ventriculomegaly. Cerebral volume is probably normal for age. No acute intracranial hemorrhage identified. No cortically based acute infarct identified. There is patchy, moderate bilateral periventricular white matter hypodensity. And there is  asymmetric heterogeneity in the thalami, greater on the left (series 2, image 18) compatible with age indeterminate small vessel infarcts. However, on the coronal view the left thalamus hypodensity has a chronic appearance. No superimposed No cortically based acute infarct identified. Vascular: Calcified atherosclerosis at the skull base. No suspicious intracranial vascular hyperdensity. Skull: Pronounced skull base motion artifact. No acute osseous abnormality is evident. Sinuses/Orbits: Motion artifact. No definite sinus or mastoid opacification. Other: Orbits are obscured by motion. Visualized scalp soft tissues are within normal limits. IMPRESSION: 1. Motion degraded exam. 2. 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 3. No other acute intracranial abnormality. Electronically Signed   By: Odessa Fleming M.D.   On: 05/18/2023 05:20   DG Chest Port 1 View  Result Date: 05/05/2023 CLINICAL DATA:  Questionable sepsis. EXAM: PORTABLE CHEST 1 VIEW COMPARISON:  Portable chest 04/17/2023 FINDINGS: The lungs are expiratory obscuring the lung bases. The visualized lungs are clear. No pleural effusion is seen. 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 Right IJ dialysis catheter again terminates in the right atrium. No new osseous findings. IMPRESSION: Limited study due to expiratory technique. The visualized lungs clear but the lung bases are not seen due to expiration. Stable cardiomegaly and aortic atherosclerosis. Electronically Signed   By: Almira Bar M.D.   On: 04/23/2023 04:12     Medications:    sodium chloride 250 mL (05/22/2023 1035)   norepinephrine (LEVOPHED) Adult infusion 15 mcg/min (04/26/2023 1100)   PrismaSol BGK 2/3.5     PrismaSol BGK 2/3.5     PrismaSol BGK 2/3.5     vancomycin 200 mL/hr at 05/07/2023 1100   vasopressin 0.03 Units/min  (04/26/2023 1100)      heparin injection (subcutaneous)  5,000 Units Subcutaneous Q12H   insulin aspart  0-6 Units Subcutaneous Q4H   pantoprazole (PROTONIX) IV  40 mg Intravenous Q12H   sodium zirconium cyclosilicate  10 g Oral Once   docusate sodium, fentaNYL (SUBLIMAZE) injection, midazolam, polyethylene glycol  Assessment/ Plan:  [name] is a 67 y.o.  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  He was admitted to [facility] on 05/19/2023 for Hyperkalemia [E87.5] Hypoglycemia [E16.2] Elevated liver function tests [R79.89] Cellulitis of right lower extremity [L03.115] Hypothermia, initial encounter [T68.XXXA] Septic shock (HCC) [A41.9, R65.21] Severe sepsis with acute organ dysfunction (HCC) [A41.9, R65.20] Altered mental status, unspecified altered mental status type [R41.82]  [facility]  End Stage Renal Disease with hyperkalemia: requiring four days a week for volume control -Last treatment received on Friday -Potassium 7.4, primary team is ordered shifting measures. - Due to instability, will order CRRT with 2K bath and net even UF.  Hypotensive due to septic shock, suspected from right leg cellulitis.  Remains

## 2023-05-01 NOTE — ED Provider Notes (Signed)
by mouth daily.    [provider]    Physical Exam   Triage Vital Signs: ED Triage Vitals [05/02/2023 0313]  Encounter Vitals Group     BP      Systolic BP Percentile      Diastolic BP Percentile      Pulse      Resp      Temp      Temp src      SpO2      Weight 207 lb 7.3 oz (94.1 kg)     Height 5\' 9"  (1.753 m)     Head Circumference      Peak Flow      Pain Score      Pain Loc      Pain Education      Exclude from Growth Chart     Most recent vital signs: Vitals:   05/05/2023 0550 04/29/2023 0610  BP: 117/81 (!) 89/55  Pulse:    Resp: 17 20  Temp: (!) 95.3 F  (35.2 C) (!) 95.8 F (35.4 C)  SpO2: 98% 93%    CONSTITUTIONAL: Alert, keeps eyes closed but arousable to painful stimuli, will answer no and then fall back asleep, chronically ill-appearing HEAD: Normocephalic, atraumatic EYES: Conjunctivae clear, pupils appear equal, sclera nonicteric ENT: normal nose; moist mucous membranes NECK: Supple, normal ROM CARD: Regular and tachycardic; S1 and S2 appreciated RESP: Normal chest excursion without splinting or tachypnea; breath sounds clear and equal bilaterally; no wheezes, no rhonchi, no rales, no hypoxia or respiratory distress, speaking full sentences ABD/GI: Non-distended; soft, non-tender, no rebound, no guarding, no peritoneal signs BACK: The back appears normal EXT: Redness and mild warmth noted to the distal right lower extremity with some superficial open wounds with yellow drainage SKIN: Normal color for age and race; warm; no rash on exposed skin NEURO: Will open eyes to painful stimuli and will state no but does not answer questions or follow commands    ED Results / Procedures / Treatments   LABS: (all labs ordered are listed, but only abnormal results are displayed) Labs Reviewed  LACTIC ACID, PLASMA - Abnormal; Notable for the following components:      Result Value   Lactic Acid, Venous 4.8 (*)    All other components within normal limits  PROTIME-INR - Abnormal; Notable for the following components:   Prothrombin Time 37.0 (*)    INR 3.7 (*)    All other components within normal limits  APTT - Abnormal; Notable for the following components:   aPTT 86 (*)    All other components within normal limits  BLOOD GAS, VENOUS - Abnormal; Notable for the following components:   pCO2, Ven 35 (*)    Bicarbonate 17.6 (*)    Acid-base deficit 7.8 (*)    All other components within normal limits  CBC WITH DIFFERENTIAL/PLATELET - Abnormal; Notable for the following components:   RBC 3.54 (*)    Hemoglobin 11.7 (*)    HCT 36.0  (*)    MCV 101.7 (*)    RDW 17.5 (*)    Platelets 77 (*)    nRBC 12.3 (*)    Neutro Abs 8.8 (*)    Lymphs Abs 0.0 (*)    Abs Immature Granulocytes 0.14 (*)    All other components within normal limits  COMPREHENSIVE METABOLIC PANEL - Abnormal; Notable for the following components:   Sodium 130 (*)    Potassium 7.4 (*)    Chloride 90 (*)  Final diagnoses:  Altered mental status, unspecified altered mental status type  Cellulitis of right lower extremity  Septic shock (HCC)  Elevated liver function tests  Hyperkalemia  Hypoglycemia  Hypothermia, initial encounter     Rx / DC Orders   ED  Discharge Orders     None        Note:  This document was prepared using Dragon voice recognition software and may include unintentional dictation errors.   Micheal Aguirre, Layla Maw, DO 05/16/2023 [phone]  by mouth daily.    [provider]    Physical Exam   Triage Vital Signs: ED Triage Vitals [05/02/2023 0313]  Encounter Vitals Group     BP      Systolic BP Percentile      Diastolic BP Percentile      Pulse      Resp      Temp      Temp src      SpO2      Weight 207 lb 7.3 oz (94.1 kg)     Height 5\' 9"  (1.753 m)     Head Circumference      Peak Flow      Pain Score      Pain Loc      Pain Education      Exclude from Growth Chart     Most recent vital signs: Vitals:   05/05/2023 0550 04/29/2023 0610  BP: 117/81 (!) 89/55  Pulse:    Resp: 17 20  Temp: (!) 95.3 F  (35.2 C) (!) 95.8 F (35.4 C)  SpO2: 98% 93%    CONSTITUTIONAL: Alert, keeps eyes closed but arousable to painful stimuli, will answer no and then fall back asleep, chronically ill-appearing HEAD: Normocephalic, atraumatic EYES: Conjunctivae clear, pupils appear equal, sclera nonicteric ENT: normal nose; moist mucous membranes NECK: Supple, normal ROM CARD: Regular and tachycardic; S1 and S2 appreciated RESP: Normal chest excursion without splinting or tachypnea; breath sounds clear and equal bilaterally; no wheezes, no rhonchi, no rales, no hypoxia or respiratory distress, speaking full sentences ABD/GI: Non-distended; soft, non-tender, no rebound, no guarding, no peritoneal signs BACK: The back appears normal EXT: Redness and mild warmth noted to the distal right lower extremity with some superficial open wounds with yellow drainage SKIN: Normal color for age and race; warm; no rash on exposed skin NEURO: Will open eyes to painful stimuli and will state no but does not answer questions or follow commands    ED Results / Procedures / Treatments   LABS: (all labs ordered are listed, but only abnormal results are displayed) Labs Reviewed  LACTIC ACID, PLASMA - Abnormal; Notable for the following components:      Result Value   Lactic Acid, Venous 4.8 (*)    All other components within normal limits  PROTIME-INR - Abnormal; Notable for the following components:   Prothrombin Time 37.0 (*)    INR 3.7 (*)    All other components within normal limits  APTT - Abnormal; Notable for the following components:   aPTT 86 (*)    All other components within normal limits  BLOOD GAS, VENOUS - Abnormal; Notable for the following components:   pCO2, Ven 35 (*)    Bicarbonate 17.6 (*)    Acid-base deficit 7.8 (*)    All other components within normal limits  CBC WITH DIFFERENTIAL/PLATELET - Abnormal; Notable for the following components:   RBC 3.54 (*)    Hemoglobin 11.7 (*)    HCT 36.0  (*)    MCV 101.7 (*)    RDW 17.5 (*)    Platelets 77 (*)    nRBC 12.3 (*)    Neutro Abs 8.8 (*)    Lymphs Abs 0.0 (*)    Abs Immature Granulocytes 0.14 (*)    All other components within normal limits  COMPREHENSIVE METABOLIC PANEL - Abnormal; Notable for the following components:   Sodium 130 (*)    Potassium 7.4 (*)    Chloride 90 (*)  Final diagnoses:  Altered mental status, unspecified altered mental status type  Cellulitis of right lower extremity  Septic shock (HCC)  Elevated liver function tests  Hyperkalemia  Hypoglycemia  Hypothermia, initial encounter     Rx / DC Orders   ED  Discharge Orders     None        Note:  This document was prepared using Dragon voice recognition software and may include unintentional dictation errors.   Micheal Aguirre, Layla Maw, DO 05/16/2023 [phone]  Final diagnoses:  Altered mental status, unspecified altered mental status type  Cellulitis of right lower extremity  Septic shock (HCC)  Elevated liver function tests  Hyperkalemia  Hypoglycemia  Hypothermia, initial encounter     Rx / DC Orders   ED  Discharge Orders     None        Note:  This document was prepared using Dragon voice recognition software and may include unintentional dictation errors.   Micheal Aguirre, Layla Maw, DO 05/16/2023 [phone]  by mouth daily.    [provider]    Physical Exam   Triage Vital Signs: ED Triage Vitals [05/02/2023 0313]  Encounter Vitals Group     BP      Systolic BP Percentile      Diastolic BP Percentile      Pulse      Resp      Temp      Temp src      SpO2      Weight 207 lb 7.3 oz (94.1 kg)     Height 5\' 9"  (1.753 m)     Head Circumference      Peak Flow      Pain Score      Pain Loc      Pain Education      Exclude from Growth Chart     Most recent vital signs: Vitals:   05/05/2023 0550 04/29/2023 0610  BP: 117/81 (!) 89/55  Pulse:    Resp: 17 20  Temp: (!) 95.3 F  (35.2 C) (!) 95.8 F (35.4 C)  SpO2: 98% 93%    CONSTITUTIONAL: Alert, keeps eyes closed but arousable to painful stimuli, will answer no and then fall back asleep, chronically ill-appearing HEAD: Normocephalic, atraumatic EYES: Conjunctivae clear, pupils appear equal, sclera nonicteric ENT: normal nose; moist mucous membranes NECK: Supple, normal ROM CARD: Regular and tachycardic; S1 and S2 appreciated RESP: Normal chest excursion without splinting or tachypnea; breath sounds clear and equal bilaterally; no wheezes, no rhonchi, no rales, no hypoxia or respiratory distress, speaking full sentences ABD/GI: Non-distended; soft, non-tender, no rebound, no guarding, no peritoneal signs BACK: The back appears normal EXT: Redness and mild warmth noted to the distal right lower extremity with some superficial open wounds with yellow drainage SKIN: Normal color for age and race; warm; no rash on exposed skin NEURO: Will open eyes to painful stimuli and will state no but does not answer questions or follow commands    ED Results / Procedures / Treatments   LABS: (all labs ordered are listed, but only abnormal results are displayed) Labs Reviewed  LACTIC ACID, PLASMA - Abnormal; Notable for the following components:      Result Value   Lactic Acid, Venous 4.8 (*)    All other components within normal limits  PROTIME-INR - Abnormal; Notable for the following components:   Prothrombin Time 37.0 (*)    INR 3.7 (*)    All other components within normal limits  APTT - Abnormal; Notable for the following components:   aPTT 86 (*)    All other components within normal limits  BLOOD GAS, VENOUS - Abnormal; Notable for the following components:   pCO2, Ven 35 (*)    Bicarbonate 17.6 (*)    Acid-base deficit 7.8 (*)    All other components within normal limits  CBC WITH DIFFERENTIAL/PLATELET - Abnormal; Notable for the following components:   RBC 3.54 (*)    Hemoglobin 11.7 (*)    HCT 36.0  (*)    MCV 101.7 (*)    RDW 17.5 (*)    Platelets 77 (*)    nRBC 12.3 (*)    Neutro Abs 8.8 (*)    Lymphs Abs 0.0 (*)    Abs Immature Granulocytes 0.14 (*)    All other components within normal limits  COMPREHENSIVE METABOLIC PANEL - Abnormal; Notable for the following components:   Sodium 130 (*)    Potassium 7.4 (*)    Chloride 90 (*)  by mouth daily.    [provider]    Physical Exam   Triage Vital Signs: ED Triage Vitals [05/02/2023 0313]  Encounter Vitals Group     BP      Systolic BP Percentile      Diastolic BP Percentile      Pulse      Resp      Temp      Temp src      SpO2      Weight 207 lb 7.3 oz (94.1 kg)     Height 5\' 9"  (1.753 m)     Head Circumference      Peak Flow      Pain Score      Pain Loc      Pain Education      Exclude from Growth Chart     Most recent vital signs: Vitals:   05/05/2023 0550 04/29/2023 0610  BP: 117/81 (!) 89/55  Pulse:    Resp: 17 20  Temp: (!) 95.3 F  (35.2 C) (!) 95.8 F (35.4 C)  SpO2: 98% 93%    CONSTITUTIONAL: Alert, keeps eyes closed but arousable to painful stimuli, will answer no and then fall back asleep, chronically ill-appearing HEAD: Normocephalic, atraumatic EYES: Conjunctivae clear, pupils appear equal, sclera nonicteric ENT: normal nose; moist mucous membranes NECK: Supple, normal ROM CARD: Regular and tachycardic; S1 and S2 appreciated RESP: Normal chest excursion without splinting or tachypnea; breath sounds clear and equal bilaterally; no wheezes, no rhonchi, no rales, no hypoxia or respiratory distress, speaking full sentences ABD/GI: Non-distended; soft, non-tender, no rebound, no guarding, no peritoneal signs BACK: The back appears normal EXT: Redness and mild warmth noted to the distal right lower extremity with some superficial open wounds with yellow drainage SKIN: Normal color for age and race; warm; no rash on exposed skin NEURO: Will open eyes to painful stimuli and will state no but does not answer questions or follow commands    ED Results / Procedures / Treatments   LABS: (all labs ordered are listed, but only abnormal results are displayed) Labs Reviewed  LACTIC ACID, PLASMA - Abnormal; Notable for the following components:      Result Value   Lactic Acid, Venous 4.8 (*)    All other components within normal limits  PROTIME-INR - Abnormal; Notable for the following components:   Prothrombin Time 37.0 (*)    INR 3.7 (*)    All other components within normal limits  APTT - Abnormal; Notable for the following components:   aPTT 86 (*)    All other components within normal limits  BLOOD GAS, VENOUS - Abnormal; Notable for the following components:   pCO2, Ven 35 (*)    Bicarbonate 17.6 (*)    Acid-base deficit 7.8 (*)    All other components within normal limits  CBC WITH DIFFERENTIAL/PLATELET - Abnormal; Notable for the following components:   RBC 3.54 (*)    Hemoglobin 11.7 (*)    HCT 36.0  (*)    MCV 101.7 (*)    RDW 17.5 (*)    Platelets 77 (*)    nRBC 12.3 (*)    Neutro Abs 8.8 (*)    Lymphs Abs 0.0 (*)    Abs Immature Granulocytes 0.14 (*)    All other components within normal limits  COMPREHENSIVE METABOLIC PANEL - Abnormal; Notable for the following components:   Sodium 130 (*)    Potassium 7.4 (*)    Chloride 90 (*)

## 2023-05-01 NOTE — Progress Notes (Signed)
PHARMACY -  BRIEF ANTIBIOTIC NOTE   Pharmacy has received consult(s) for Vancomycin from an ED provider.  The patient's profile has been reviewed for ht / wt / allergies / indication / available labs.    One time order(s) placed for: Vancomycin 2000 mg per pt wt: 94.1 kg  Further antibiotics/pharmacy consults should be ordered by admitting physician if indicated.                       Thank you, Otelia Sergeant, PharmD, [facility] 05/10/2023 3:21 AM

## 2023-05-01 NOTE — Consult Note (Signed)
PHARMACY CONSULT NOTE  Pharmacy Consult for Electrolyte Monitoring and Replacement   Recent Labs: Potassium (mmol/L)  Date Value  05/05/2023 7.4 (HH)   Magnesium (mg/dL)  Date Value  16/05/9603 2.0   Calcium (mg/dL)  Date Value  54/04/8118 7.1 (L)   Albumin (g/dL)  Date Value  14/78/2956 2.3 (L)   Phosphorus (mg/dL)  Date Value  21/30/8657 4.6   Sodium (mmol/L)  Date Value  04/25/2023 130 (L)   Assessment: 67 y/o M with medical history including ESRD on HD, metastatic prostate cancer on ADT, CAD s/p CABG, ischemic cardiomyopathy, Afib on apixaban, DM, HTN, duodenal ulcers, cognitive deficits, alcohol use disorder admitted with encephalopathy in setting of sepsis / uremia.   Labs on admission notable for K 7.4, LA 5.3, elevated liver enzymes  Goal of Therapy:  Electrolytes within normal limits  Plan:  --K 7.4, management per PCCM team --Plan is to start CRRT, re-check labs in afternoon  Tressie Ellis 05/07/2023 9:24 AM

## 2023-05-01 NOTE — Progress Notes (Addendum)
CODE SEPSIS - PHARMACY COMMUNICATION  **Broad Spectrum Antibiotics should be administered within 1 hour of Sepsis diagnosis**  Time Code Sepsis Called/Page Received: 1610   Antibiotics Ordered: Cefepime, Flagyl, & Vancomycin  Time of 1st antibiotic administration: 0423  Otelia Sergeant, PharmD, [facility] 05/12/2023 3:22 AM

## 2023-05-01 NOTE — Procedures (Signed)
Central Venous Catheter Insertion Procedure Note  ERVEN WINKLE  [id number]  1956-03-22  Date:05/12/2023  Time:10:18 AM   Provider Performing:Conall Vangorder Earnest Conroy   Procedure: Insertion of Non-tunneled Central Venous [phone]) with US guidance ([id number])   Indication(s) Medication administration and Difficult access  Consent Risks of the procedure as well as the alternatives and risks of each were explained to the patient and/or caregiver.  Consent for the procedure was obtained and is signed in the bedside chart  Anesthesia Topical only with 1% lidocaine   Timeout Verified patient identification, verified procedure, site/side was marked, verified correct patient position, special equipment/implants available, medications/allergies/relevant history reviewed, required imaging and test results available.  Sterile Technique Maximal sterile technique including full sterile barrier drape, hand hygiene, sterile gown, sterile gloves, mask, hair covering, sterile ultrasound probe cover (if used).  Procedure Description Area of catheter insertion was cleaned with chlorhexidine and draped in sterile fashion.  With real-time ultrasound guidance a central venous catheter was placed into the left femoral vein. Nonpulsatile blood flow and easy flushing noted in all ports.  The catheter was sutured in place and sterile dressing applied.  Complications/Tolerance None; patient tolerated the procedure well. Chest X-ray is ordered to verify placement for internal jugular or subclavian cannulation.   Chest x-ray is not ordered for femoral cannulation.  EBL Minimal  Specimen(s) None  Zada Girt, AGNP  Pulmonary/Critical Care Pager [phone] (please enter 7 digits) PCCM Consult Pager [phone] (please enter 7 digits)

## 2023-05-01 NOTE — Progress Notes (Signed)
Patient alert to self, on 2 L [place]. Patient hypotensive, NP made aware.Pressor started for blood pressure support. Bear hugger in place as needed for temperature. Wounds on arrival, charted.   Central line placed, tolerated well. HR Afibb, HR sustaining in 150s. Amio bolus given per orders.   Central line site bleeding, NP made aware.Reinforced with Surgcell. Dressing replaced.   Patient started on CRRT, tolerating well.   NP made aware of 0 output, patient bladder scanned. O mls per scan.   Family at bedside, updated.  Femoral site oozing, NP made aware. Dressing reinforced. New orders given.  CRRT at goal of net zero. Patient tolerating well.

## 2023-05-01 NOTE — Consult Note (Signed)
Pharmacy Antibiotic Note  Micheal Aguirre is a 67 y.o. male with PMH including ESRD on HD, metastatic prostate cancer on ADT, CAD s/p CABG, Afib on Eliquis, T2DM, HTN, cognitive deficits, ischemic cardiomyopathy, duodenal ulcers, history of alcohol use disorder. admitted on 04/24/2023 with severe sepsis and AMS, who presented from [facility] and rehab facility. Patient recently admitted (8/26-9/3) for cellulitis of right leg, treated with IV antibiotics & discharged with 5 days Keflex. Possible sources include but are not limited to cellulitis of RLE  Pharmacy has been consulted for Vancomycin & Zosyn dosing.    Plan: Patient will start CRRT today per medical team  1) Zosyn 3.375g IV q8h (4 hour infusion).  2) Vancomycin  -- LD 2,000 mg in ED x1 -- Maintenance 1,000 mg q24h -- Goal trough 15-20 -- Check levels once at steady state   Height: 5\' 9"  (175.3 cm) Weight: 94.1 kg (207 lb 7.3 oz) IBW/kg (Calculated) : 70.7  Temp (24hrs), Avg:95.6 F (35.3 C), Min:93.5 F (34.2 C), Max:96.3 F (35.7 C)  Recent Labs  Lab 05/15/2023 0331 04/24/2023 0414 05/14/2023 0421 05/08/2023 0613  WBC  --   --  9.4  --   CREATININE  --  6.68*  --   --   LATICACIDVEN 4.8*  --   --  5.3*    Estimated Creatinine Clearance: 12.2 mL/min (A) (by C-G formula based on SCr of 6.68 mg/dL (H)).    Allergies  Allergen Reactions   Hydralazine     Other reaction(s): Kidney Disorder, Other (See Comments) Concern for drug-induced AIN in 06/2021    Lisinopril Swelling and Other (See Comments)    Angioedema    Bimatoprost     Other reaction(s): Unknown   Empagliflozin Other (See Comments)    UTI Other reaction(s): uti     Antimicrobials this admission: 9/9 Vancomycin >>  9/9 Zosyn >>  9/9 Flagyl 500mg  x1 9/9 Cefepime 2g x1  Dose adjustments this admission: N/A  Microbiology results: 9/9 BCx: NG <12h 9/9 GI Panel: Enteroaggregative E. Coli+  9/9 C. Diff: negative 9/9 Resp. Panel: negative  Thank  you for allowing pharmacy to be a part of this patient's care.  Darolyn Rua, PharmD Student [employer]

## 2023-05-01 NOTE — IPAL (Signed)
Interdisciplinary Goals of Care Family Meeting   Date carried out: 2023/05/23  Location of the meeting: Conference room  Member's involved: Physician, Nurse Practitioner, and Family Member or next of kin  Durable Power of Attorney or acting medical decision maker:  Daughter Micheal Aguirre   GOALS OF CARE DISCUSSION  The Clinical status was relayed to family in detail-  Updated and notified of patients medical condition- Patient remains unresponsive and will not open eyes to command.   Explained to family course of therapy and the modalities   Patient with Progressive multiorgan failure with a very high probablity of a very minimal chance of meaningful recovery despite all aggressive and optimal medical therapy.   PATIENT REMAINS FULL CODE  Family understands the situation. Multiorgan failure with metastatic disease with acute liver failure and acidosis and high risk for cardiac arrest and death  Plan is to assess documents that patient filled out several years ago to address GOC and Code Status   Family are satisfied with Plan of action and management. All questions answered  Additional CC time 35 mins   Micheal Aguirre Micheal Aguirre, M.D.  [facility]  Medical Director [facility] Medical Director [facility] Cardio-Pulmonary Department

## 2023-05-01 NOTE — ED Triage Notes (Signed)
Pt comes by EMS from [facility] and rehab for AMS. EMS was unsure of baseline and the nursing facility did not know as well. Pt is unable to speak duroing triage localizes to pain. 2L [place] at baseline     EMS VS  HR 110 BP 160/100 BG 85 CO2 22 Temp 97.7 ax.  GCS 10

## 2023-05-01 NOTE — ED Notes (Signed)
Report given to Beth RN at this time.

## 2023-05-01 NOTE — TOC CM/SW Note (Addendum)
Pt AAOx1. Daughter is flying into hospital and will be here this afternoon. Pt recently admitted from [facility]. Cm called admissions director of [facility]. Micheal Aguirre confirmed that pt was there for STR and they are willing to accept pt back. Pt is an HD pt [facility]. According to [facility], pt last day of dialysis was on Friday 04/28/23. CM will talk with daughter on arrival to hospital   1522- Cm called daughter Micheal Aguirre. She stated that she just spoke with the doctor and had to f/u with the rest of her family. She will give me a call back.

## 2023-05-01 NOTE — H&P (Signed)
NAME:  Micheal Aguirre, MRN:  [id number], DOB:  1956-07-10, LOS: 0 ADMISSION DATE:  05/02/2023, CONSULTATION DATE:  05/16/2023 REFERRING MD:  Rochele Raring, CHIEF COMPLAINT:  Altered Mental Status    HPI  HPI: Micheal Aguirre is a 67 y.o. 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  On review of chart patient was recently admitted from 04/17/2023 to 04/25/2023 with cellulitis of right leg for which she completed 8 days of IV antibiotics and was discharged on Keflex for additional 5 days.    ED Course: Initial vital signs showed HR of beats/minute, BP 117/81 mm Hg, the RR 17 breaths/minute, and the oxygen saturation 98% on and a temperature of 95.72F (35.2C).  Na+/ K+: 130/7.4 Glucose: 182 BUN/Cr.:75/6.68 Calcium: 7.1 AST/ALT:808/403, CO2 18, Anion Gap 22 WBC: 9.4 Hgb/Hct: 11.7/36.0 Plts: 77 PCT:79.84 Lactic acid: 4.8 COVID PCR: Negative,  VBG: pO2 34; pCO2 35; pH 7.31;  HCO3 17.6, %O2 Sat 52.3.  CXR> CTH> CTA Chest> CT Abd/pelvis>see below  Patient given 30 cc/kg of fluids and started on broad-spectrum antibiotics Vanco cefepime and Flagyl for sepsis with septic shock. Patient remained hypotensive despite IVF boluses therefore was started on Levophed.  (Sepsis reassessment completed). PCCM consulted.  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 The visualized lungs clear but the lung bases are not seen due to expiration. Stable cardiomegaly and aortic atherosclerosis.  9/9: Noncontrast CT head>IMPRESSION: 1. Motion degraded exam. 2. 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 3. No other acute intracranial abnormality.  IMPRESSION: 1. Progressed hepatomegaly and liver extensive infiltration by indistinct hypodense masses since CT last month. Unclear whether this is Primary Versus Metastatic Liver Tumor. But there is associated Pathologic Retroperitoneal and Pelvic/inguinal Lymphadenopathy. Plus diffusely heterogeneous bone mineralization raising the possibility of skeletal metastases.   2. But no superimposed new/acute or inflammatory process is identified in the noncontrast chest, abdomen, or pelvis.   3.  Aortic Atherosclerosis (ICD10-I70.0). 9/9: CT Chest, abdomen and pelvis>  Interim History / Subjective:    See significant events  Micro Data:  9/9: SARS-CoV-2 PCR> negative 9/9: Influenza PCR> negative 9/9: Blood culture x2> 9/9: MRSA PCR>>   Antimicrobials:  Vancomycin 9/9> Cefepime 9/9> Metronidazole 9/9>  OBJECTIVE  Blood pressure 117/81, pulse (!) 25, temperature (!) 95.3 F (35.2 C), resp. rate 17, height 5\' 9"  (1.753 m), weight 94.1 kg, SpO2 98%.       No intake or output data in the 24 hours ending 05/10/2023 0604 Filed Weights   04/30/2023 0313  Weight: 94.1 kg   Physical Examination  GENERAL: 67 year-old critically ill patient lying in the bed obtunded  EYES: PEERLA. No scleral icterus. Extraocular muscles intact.  HEENT: Head atraumatic, normocephalic. Oropharynx and nasopharynx  NAME:  Micheal Aguirre, MRN:  [id number], DOB:  1956-07-10, LOS: 0 ADMISSION DATE:  05/02/2023, CONSULTATION DATE:  05/16/2023 REFERRING MD:  Rochele Raring, CHIEF COMPLAINT:  Altered Mental Status    HPI  HPI: Micheal Aguirre is a 67 y.o. 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  On review of chart patient was recently admitted from 04/17/2023 to 04/25/2023 with cellulitis of right leg for which she completed 8 days of IV antibiotics and was discharged on Keflex for additional 5 days.    ED Course: Initial vital signs showed HR of beats/minute, BP 117/81 mm Hg, the RR 17 breaths/minute, and the oxygen saturation 98% on and a temperature of 95.72F (35.2C).  Na+/ K+: 130/7.4 Glucose: 182 BUN/Cr.:75/6.68 Calcium: 7.1 AST/ALT:808/403, CO2 18, Anion Gap 22 WBC: 9.4 Hgb/Hct: 11.7/36.0 Plts: 77 PCT:79.84 Lactic acid: 4.8 COVID PCR: Negative,  VBG: pO2 34; pCO2 35; pH 7.31;  HCO3 17.6, %O2 Sat 52.3.  CXR> CTH> CTA Chest> CT Abd/pelvis>see below  Patient given 30 cc/kg of fluids and started on broad-spectrum antibiotics Vanco cefepime and Flagyl for sepsis with septic shock. Patient remained hypotensive despite IVF boluses therefore was started on Levophed.  (Sepsis reassessment completed). PCCM consulted.  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 The visualized lungs clear but the lung bases are not seen due to expiration. Stable cardiomegaly and aortic atherosclerosis.  9/9: Noncontrast CT head>IMPRESSION: 1. Motion degraded exam. 2. 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 3. No other acute intracranial abnormality.  IMPRESSION: 1. Progressed hepatomegaly and liver extensive infiltration by indistinct hypodense masses since CT last month. Unclear whether this is Primary Versus Metastatic Liver Tumor. But there is associated Pathologic Retroperitoneal and Pelvic/inguinal Lymphadenopathy. Plus diffusely heterogeneous bone mineralization raising the possibility of skeletal metastases.   2. But no superimposed new/acute or inflammatory process is identified in the noncontrast chest, abdomen, or pelvis.   3.  Aortic Atherosclerosis (ICD10-I70.0). 9/9: CT Chest, abdomen and pelvis>  Interim History / Subjective:    See significant events  Micro Data:  9/9: SARS-CoV-2 PCR> negative 9/9: Influenza PCR> negative 9/9: Blood culture x2> 9/9: MRSA PCR>>   Antimicrobials:  Vancomycin 9/9> Cefepime 9/9> Metronidazole 9/9>  OBJECTIVE  Blood pressure 117/81, pulse (!) 25, temperature (!) 95.3 F (35.2 C), resp. rate 17, height 5\' 9"  (1.753 m), weight 94.1 kg, SpO2 98%.       No intake or output data in the 24 hours ending 05/10/2023 0604 Filed Weights   04/30/2023 0313  Weight: 94.1 kg   Physical Examination  GENERAL: 67 year-old critically ill patient lying in the bed obtunded  EYES: PEERLA. No scleral icterus. Extraocular muscles intact.  HEENT: Head atraumatic, normocephalic. Oropharynx and nasopharynx  clear.  NECK:  No JVD, supple  LUNGS: Normal breath sounds  bilaterally.  No use of accessory muscles of respiration.  CARDIOVASCULAR: S1, S2 normal. No murmurs, rubs, or gallops.  ABDOMEN: Soft, NTND EXTREMITIES:Right lower extremity: Significant uniform swelling and exquisite tenderness to palpation. Subtle erythema noted particularly over the medial inferior aspect where is skin breakdown with serous drainage. Erythema extends upwards. Increased warmth to touch  Capillary refill < 3 seconds in all extremities. Pulses palpable distally. NEUROLOGIC: The patient is altered but arouses to noxious stimuli. No focal neurological deficit appreciated. Cranial nerves are intact.  SKIN:see below    Labs/imaging that I havepersonally reviewed  (right click and "Reselect all SmartList Selections" daily)     Labs   CBC: Recent Labs  Lab 05/08/2023 0421  WBC 9.4  NEUTROABS 8.8*  HGB 11.7*  HCT 36.0*  MCV 101.7*  PLT 77*    Basic Metabolic Panel: Recent Labs  Lab 05/11/2023 0414  NA 130*  K 7.4*  CL 90*  CO2 18*  GLUCOSE 182*  BUN 75*  CREATININE 6.68*  CALCIUM 7.1*   GFR: Estimated Creatinine Clearance: 12.2 mL/min (A) (by C-G formula based on SCr of 6.68 mg/dL (H)). Recent Labs  Lab 04/25/2023 0331 05/07/2023 0414 04/26/2023 0421  PROCALCITON  --  79.84  --   WBC  --   --  9.4  LATICACIDVEN 4.8*  --   --     Liver Function Tests: Recent Labs  Lab 05/13/2023 0414  AST 1,892*  ALT 808*  ALKPHOS 403*  BILITOT 3.0*  PROT 5.7*  ALBUMIN 2.3*   No results for input(s): "LIPASE", "AMYLASE" in the last 168 hours. No results for input(s): "AMMONIA" in the last 168 hours.  ABG    Component Value Date/Time   HCO3 17.6 (L) 05/15/2023 0325   ACIDBASEDEF 7.8 (H) 04/25/2023 0325   O2SAT 52.3 04/26/2023 0325     Coagulation Profile: Recent Labs  Lab 05/13/2023 0331  INR 3.7*    Cardiac Enzymes: No results for input(s): "CKTOTAL", "CKMB", "CKMBINDEX", "TROPONINI" in the last 168 hours.  HbA1C: Hgb A1c MFr Bld  Date/Time Value Ref  Range Status  11/27/2021 05:59 AM 5.6 4.8 - 5.6 % Final    Comment:    (NOTE) Pre diabetes:          5.7%-6.4%  Diabetes:              >6.4%  Glycemic control for   <7.0% adults with diabetes     CBG: Recent Labs  Lab 04/24/23 2212 04/25/23 0822 04/25/23 1221 04/25/23 1226 04/30/2023 0348  GLUCAP 122* 100* 128* 109* 60*    Review of Systems:   Unable to be obtained secondary to the patient's altered mental status.    Past Medical History  He,  has a past medical history of Diabetes mellitus without complication (HCC), ESRD (end stage renal disease) (HCC), Hypercholesteremia, Hypertension, and Renal disorder.   Surgical History    Past Surgical History:  Procedure Laterality Date   ORIF FEMUR FRACTURE Right 02/03/2022   Procedure: OPEN REDUCTION INTERNAL FIXATION (ORIF) DISTAL FEMUR FRACTURE;  Surgeon: Christena Flake, MD;  Location: [facility];  Service: Orthopedics;  Laterality: Right;   ORIF TIBIA FRACTURE Right 11/27/2021   Procedure: OPEN REDUCTION INTERNAL FIXATION (ORIF) TIBIA FRACTURE;  Surgeon: Juanell Fairly, MD;  Location: [facility];  Service: Orthopedics;  Laterality: Right;     Social History   reports that he has quit smoking. His smoking  clear.  NECK:  No JVD, supple  LUNGS: Normal breath sounds  bilaterally.  No use of accessory muscles of respiration.  CARDIOVASCULAR: S1, S2 normal. No murmurs, rubs, or gallops.  ABDOMEN: Soft, NTND EXTREMITIES:Right lower extremity: Significant uniform swelling and exquisite tenderness to palpation. Subtle erythema noted particularly over the medial inferior aspect where is skin breakdown with serous drainage. Erythema extends upwards. Increased warmth to touch  Capillary refill < 3 seconds in all extremities. Pulses palpable distally. NEUROLOGIC: The patient is altered but arouses to noxious stimuli. No focal neurological deficit appreciated. Cranial nerves are intact.  SKIN:see below    Labs/imaging that I havepersonally reviewed  (right click and "Reselect all SmartList Selections" daily)     Labs   CBC: Recent Labs  Lab 05/08/2023 0421  WBC 9.4  NEUTROABS 8.8*  HGB 11.7*  HCT 36.0*  MCV 101.7*  PLT 77*    Basic Metabolic Panel: Recent Labs  Lab 05/11/2023 0414  NA 130*  K 7.4*  CL 90*  CO2 18*  GLUCOSE 182*  BUN 75*  CREATININE 6.68*  CALCIUM 7.1*   GFR: Estimated Creatinine Clearance: 12.2 mL/min (A) (by C-G formula based on SCr of 6.68 mg/dL (H)). Recent Labs  Lab 04/25/2023 0331 05/07/2023 0414 04/26/2023 0421  PROCALCITON  --  79.84  --   WBC  --   --  9.4  LATICACIDVEN 4.8*  --   --     Liver Function Tests: Recent Labs  Lab 05/13/2023 0414  AST 1,892*  ALT 808*  ALKPHOS 403*  BILITOT 3.0*  PROT 5.7*  ALBUMIN 2.3*   No results for input(s): "LIPASE", "AMYLASE" in the last 168 hours. No results for input(s): "AMMONIA" in the last 168 hours.  ABG    Component Value Date/Time   HCO3 17.6 (L) 05/15/2023 0325   ACIDBASEDEF 7.8 (H) 04/25/2023 0325   O2SAT 52.3 04/26/2023 0325     Coagulation Profile: Recent Labs  Lab 05/13/2023 0331  INR 3.7*    Cardiac Enzymes: No results for input(s): "CKTOTAL", "CKMB", "CKMBINDEX", "TROPONINI" in the last 168 hours.  HbA1C: Hgb A1c MFr Bld  Date/Time Value Ref  Range Status  11/27/2021 05:59 AM 5.6 4.8 - 5.6 % Final    Comment:    (NOTE) Pre diabetes:          5.7%-6.4%  Diabetes:              >6.4%  Glycemic control for   <7.0% adults with diabetes     CBG: Recent Labs  Lab 04/24/23 2212 04/25/23 0822 04/25/23 1221 04/25/23 1226 04/30/2023 0348  GLUCAP 122* 100* 128* 109* 60*    Review of Systems:   Unable to be obtained secondary to the patient's altered mental status.    Past Medical History  He,  has a past medical history of Diabetes mellitus without complication (HCC), ESRD (end stage renal disease) (HCC), Hypercholesteremia, Hypertension, and Renal disorder.   Surgical History    Past Surgical History:  Procedure Laterality Date   ORIF FEMUR FRACTURE Right 02/03/2022   Procedure: OPEN REDUCTION INTERNAL FIXATION (ORIF) DISTAL FEMUR FRACTURE;  Surgeon: Christena Flake, MD;  Location: [facility];  Service: Orthopedics;  Laterality: Right;   ORIF TIBIA FRACTURE Right 11/27/2021   Procedure: OPEN REDUCTION INTERNAL FIXATION (ORIF) TIBIA FRACTURE;  Surgeon: Juanell Fairly, MD;  Location: [facility];  Service: Orthopedics;  Laterality: Right;     Social History   reports that he has quit smoking. His smoking

## 2023-05-01 NOTE — Sepsis Progress Note (Signed)
[facility] following for sepsis protocol. 

## 2023-05-02 DIAGNOSIS — R6520 Severe sepsis without septic shock: Secondary | ICD-10-CM | POA: Diagnosis not present

## 2023-05-02 DIAGNOSIS — A419 Sepsis, unspecified organism: Secondary | ICD-10-CM

## 2023-05-02 LAB — CBC
HCT: 33.2 % — ABNORMAL LOW (ref 39.0–52.0)
HCT: 31.3 % — ABNORMAL LOW (ref 39.0–52.0)
Hemoglobin: 10.9 g/dL — ABNORMAL LOW (ref 13.0–17.0)
Hemoglobin: 10.6 g/dL — ABNORMAL LOW (ref 13.0–17.0)
MCH: 32.4 pg (ref 26.0–34.0)
MCH: 32.4 pg (ref 26.0–34.0)
MCHC: 32.8 g/dL (ref 30.0–36.0)
MCHC: 33.9 g/dL (ref 30.0–36.0)
MCV: 98.8 fL (ref 80.0–100.0)
MCV: 95.7 fL (ref 80.0–100.0)
Platelets: 63 10*3/uL — ABNORMAL LOW (ref 150–400)
Platelets: 53 10*3/uL — ABNORMAL LOW (ref 150–400)
RBC: 3.36 MIL/uL — ABNORMAL LOW (ref 4.22–5.81)
RBC: 3.27 MIL/uL — ABNORMAL LOW (ref 4.22–5.81)
RDW: 17.2 % — ABNORMAL HIGH (ref 11.5–15.5)
RDW: 18.5 % — ABNORMAL HIGH (ref 11.5–15.5)
WBC: 8.6 10*3/uL (ref 4.0–10.5)
WBC: 8.3 10*3/uL (ref 4.0–10.5)
nRBC: 6.2 % — ABNORMAL HIGH (ref 0.0–0.2)
nRBC: 9.1 % — ABNORMAL HIGH (ref 0.0–0.2)

## 2023-05-02 LAB — RENAL FUNCTION PANEL
Albumin: 2.2 g/dL — ABNORMAL LOW (ref 3.5–5.0)
Albumin: 2.2 g/dL — ABNORMAL LOW (ref 3.5–5.0)
Anion gap: 15 (ref 5–15)
Anion gap: 14 (ref 5–15)
BUN: 42 mg/dL — ABNORMAL HIGH (ref 8–23)
BUN: 30 mg/dL — ABNORMAL HIGH (ref 8–23)
CO2: 23 mmol/L (ref 22–32)
CO2: 22 mmol/L (ref 22–32)
Calcium: 8.4 mg/dL — ABNORMAL LOW (ref 8.9–10.3)
Calcium: 8.6 mg/dL — ABNORMAL LOW (ref 8.9–10.3)
Chloride: 96 mmol/L — ABNORMAL LOW (ref 98–111)
Chloride: 99 mmol/L (ref 98–111)
Creatinine, Ser: 3.35 mg/dL — ABNORMAL HIGH (ref 0.61–1.24)
Creatinine, Ser: 2.2 mg/dL — ABNORMAL HIGH (ref 0.61–1.24)
GFR, Estimated: 19 mL/min — ABNORMAL LOW (ref >60)
GFR, Estimated: 32 mL/min — ABNORMAL LOW (ref >60)
Glucose, Bld: 152 mg/dL — ABNORMAL HIGH (ref 70–99)
Glucose, Bld: 146 mg/dL — ABNORMAL HIGH (ref 70–99)
Phosphorus: 4.3 mg/dL (ref 2.5–4.6)
Phosphorus: 3.5 mg/dL (ref 2.5–4.6)
Potassium: 4 mmol/L (ref 3.5–5.1)
Potassium: 3.9 mmol/L (ref 3.5–5.1)
Sodium: 134 mmol/L — ABNORMAL LOW (ref 135–145)
Sodium: 135 mmol/L (ref 135–145)

## 2023-05-02 LAB — CBC WITH DIFFERENTIAL/PLATELET
Abs Immature Granulocytes: 0.06 10*3/uL (ref 0.00–0.07)
Basophils Absolute: 0 10*3/uL (ref 0.0–0.1)
Basophils Relative: 0 %
Eosinophils Absolute: 0 10*3/uL (ref 0.0–0.5)
Eosinophils Relative: 0 %
HCT: 32 % — ABNORMAL LOW (ref 39.0–52.0)
Hemoglobin: 10.6 g/dL — ABNORMAL LOW (ref 13.0–17.0)
Immature Granulocytes: 1 %
Lymphocytes Relative: 0 %
Lymphs Abs: 0 10*3/uL — ABNORMAL LOW (ref 0.7–4.0)
MCH: 32.3 pg (ref 26.0–34.0)
MCHC: 33.1 g/dL (ref 30.0–36.0)
MCV: 97.6 fL (ref 80.0–100.0)
Monocytes Absolute: 0.2 10*3/uL (ref 0.1–1.0)
Monocytes Relative: 3 %
Neutro Abs: 7.7 10*3/uL (ref 1.7–7.7)
Neutrophils Relative %: 96 %
Platelets: 53 10*3/uL — ABNORMAL LOW (ref 150–400)
RBC: 3.28 MIL/uL — ABNORMAL LOW (ref 4.22–5.81)
RDW: 18 % — ABNORMAL HIGH (ref 11.5–15.5)
Smear Review: DECREASED
WBC: 8 10*3/uL (ref 4.0–10.5)
nRBC: 8.3 % — ABNORMAL HIGH (ref 0.0–0.2)

## 2023-05-02 LAB — PROTIME-INR
INR: 3.3 — ABNORMAL HIGH (ref 0.8–1.2)
Prothrombin Time: 34.1 s — ABNORMAL HIGH (ref 11.4–15.2)

## 2023-05-02 LAB — GLUCOSE, CAPILLARY
Glucose-Capillary: 117 mg/dL — ABNORMAL HIGH (ref 70–99)
Glucose-Capillary: 125 mg/dL — ABNORMAL HIGH (ref 70–99)
Glucose-Capillary: 132 mg/dL — ABNORMAL HIGH (ref 70–99)
Glucose-Capillary: 139 mg/dL — ABNORMAL HIGH (ref 70–99)
Glucose-Capillary: 140 mg/dL — ABNORMAL HIGH (ref 70–99)
Glucose-Capillary: 142 mg/dL — ABNORMAL HIGH (ref 70–99)
Glucose-Capillary: 144 mg/dL — ABNORMAL HIGH (ref 70–99)
Glucose-Capillary: 157 mg/dL — ABNORMAL HIGH (ref 70–99)

## 2023-05-02 LAB — HEPATIC FUNCTION PANEL
ALT: 877 U/L — ABNORMAL HIGH (ref 0–44)
AST: 2233 U/L — ABNORMAL HIGH (ref 15–41)
Albumin: 2.2 g/dL — ABNORMAL LOW (ref 3.5–5.0)
Alkaline Phosphatase: 412 U/L — ABNORMAL HIGH (ref 38–126)
Bilirubin, Direct: 1.6 mg/dL — ABNORMAL HIGH (ref 0.0–0.2)
Indirect Bilirubin: 1.5 mg/dL — ABNORMAL HIGH (ref 0.3–0.9)
Total Bilirubin: 3.1 mg/dL — ABNORMAL HIGH (ref 0.3–1.2)
Total Protein: 5.8 g/dL — ABNORMAL LOW (ref 6.5–8.1)

## 2023-05-02 LAB — TYPE AND SCREEN
ABO/RH(D): O POS
Antibody Screen: NEGATIVE

## 2023-05-02 LAB — MAGNESIUM: Magnesium: 2.2 mg/dL (ref 1.7–2.4)

## 2023-05-02 LAB — LACTIC ACID, PLASMA
Lactic Acid, Venous: 2.3 mmol/L (ref 0.5–1.9)
Lactic Acid, Venous: 2.8 mmol/L (ref 0.5–1.9)

## 2023-05-02 LAB — PROCALCITONIN: Procalcitonin: 48.67 ng/mL

## 2023-05-02 MED ORDER — PRISMASOL BGK 4/2.5 32-4-2.5 MEQ/L EC SOLN: Status: DC

## 2023-05-02 MED ORDER — VITAMIN K1 10 MG/ML IJ SOLN
10.0000 mg | Freq: Once | INTRAVENOUS | Status: AC
  Administered 2023-05-02: 10 mg via INTRAVENOUS
  Filled 2023-05-02: qty 1

## 2023-05-02 MED ORDER — PRISMASOL BGK 4/2.5 32-4-2.5 MEQ/L REPLACEMENT SOLN
Status: DC
  Filled 2023-05-02: qty 5000

## 2023-05-02 MED ORDER — HYDROCORTISONE SOD SUC (PF) 100 MG IJ SOLR
50.0000 mg | Freq: Three times a day (TID) | INTRAMUSCULAR | Status: DC
  Administered 2023-05-02 – 2023-05-04 (×6): 50 mg via INTRAVENOUS
  Filled 2023-05-02 (×6): qty 2

## 2023-05-02 MED ORDER — FENTANYL CITRATE PF 50 MCG/ML IJ SOSY
12.5000 ug | PREFILLED_SYRINGE | INTRAMUSCULAR | Status: DC | PRN
  Administered 2023-05-02 (×3): 25 ug via INTRAVENOUS
  Administered 2023-05-03: 12.5 ug via INTRAVENOUS
  Administered 2023-05-03 – 2023-05-04 (×5): 25 ug via INTRAVENOUS
  Administered 2023-05-04: 12.5 ug via INTRAVENOUS
  Filled 2023-05-02 (×10): qty 1

## 2023-05-02 MED ORDER — SODIUM CHLORIDE 0.9% IV SOLUTION: Freq: Once | INTRAVENOUS | Status: DC

## 2023-05-02 MED ORDER — CHLORHEXIDINE GLUCONATE CLOTH 2 % EX PADS
6.0000 | MEDICATED_PAD | Freq: Every day | CUTANEOUS | Status: DC
  Administered 2023-05-02 – 2023-05-03 (×2): 6 via TOPICAL

## 2023-05-02 NOTE — Plan of Care (Signed)
  Problem: Clinical Measurements: Goal: Ability to maintain clinical measurements within normal limits will improve Outcome: Progressing Goal: Will remain free from infection Outcome: Progressing Goal: Diagnostic test results will improve Outcome: Progressing Goal: Cardiovascular complication will be avoided Outcome: Progressing   Problem: Elimination: Goal: Will not experience complications related to bowel motility Outcome: Progressing   Problem: Pain Managment: Goal: General experience of comfort will improve Outcome: Progressing   Problem: Safety: Goal: Ability to remain free from injury will improve Outcome: Progressing   Problem: Skin Integrity: Goal: Risk for impaired skin integrity will decrease Outcome: Progressing

## 2023-05-02 NOTE — Consult Note (Signed)
Pharmacy Antibiotic Note  Micheal Aguirre is a 67 y.o. male with PMH including ESRD on HD, metastatic prostate cancer on ADT, CAD s/p CABG, Afib on Eliquis, T2DM, HTN, cognitive deficits, ischemic cardiomyopathy, duodenal ulcers, history of alcohol use disorder. admitted on 05/19/2023 with severe sepsis and AMS, who presented from [facility] and rehab facility. Patient recently admitted (8/26-9/3) for cellulitis of right leg, treated with IV antibiotics & discharged with 5 days Keflex. Possible sources include but are not limited to cellulitis of RLE  Pharmacy has been consulted for Vancomycin & Zosyn dosing.   Patient is currently on CRRT.  Plan: 1) Continue Zosyn 3.375g IV q8h (4 hour infusion).  2) Continue Vancomycin maintenance 1,000 mg q24h -- LD 2,000 mg in ED x1 -- Goal trough 15-20 -- Check levels once at steady state   Height: 5\' 9"  (175.3 cm) Weight: 79.8 kg (175 lb 14.8 oz) IBW/kg (Calculated) : 70.7  Temp (24hrs), Avg:98.7 F (37.1 C), Min:97.7 F (36.5 C), Max:99.3 F (37.4 C)  Recent Labs  Lab 05/09/2023 0331 05/03/2023 0414 05/21/2023 0421 04/28/2023 0613 05/20/2023 1102 04/26/2023 1254 05/02/2023 1547 05/12/2023 2250 05/02/23 0404  WBC  --   --  9.4  --   --   --   --  9.5 8.6  CREATININE  --  6.68*  --   --  6.31*  --  5.59*  --  3.35*  LATICACIDVEN 4.8*  --   --  5.3* 3.1* 2.2*  --   --  2.3*    Estimated Creatinine Clearance: 21.4 mL/min (A) (by C-G formula based on SCr of 3.35 mg/dL (H)).    Allergies  Allergen Reactions   Hydralazine     Other reaction(s): Kidney Disorder, Other (See Comments) Concern for drug-induced AIN in 06/2021    Lisinopril Swelling and Other (See Comments)    Angioedema    Bimatoprost     Other reaction(s): Unknown   Empagliflozin Other (See Comments)    UTI Other reaction(s): uti     Antimicrobials this admission: 9/9 Vancomycin >>  9/9 Zosyn >>  9/9 Flagyl 500mg  x1 9/9 Cefepime 2g x1  Dose adjustments this  admission: N/A  Microbiology results: 9/9 BCx: NGTD 9/9 GI Panel: Enteroaggregative E. Coli+  9/9 C. Diff: negative 9/9 Resp. Panel: negative 9/9 MRSA PCR: negative  Thank you for allowing pharmacy to be a part of this patient's care.  Darolyn Rua, PharmD Student [employer]

## 2023-05-02 NOTE — Progress Notes (Signed)
Updated pts daughter at bedside regarding pts condition and current plan of care all questions were answered.  Oncology also consulted to discuss pts condition.  All questions were answered.  Zada Girt, AGNP  Pulmonary/Critical Care Pager [phone] (please enter 7 digits) PCCM Consult Pager [phone] (please enter 7 digits)

## 2023-05-02 NOTE — Progress Notes (Signed)
of the fibular neck noted. There is ossification between the distal tibia and fibula consistent with an old injury of the syndesmosis. Advanced degenerative changes are present at the 1st metatarsophalangeal joint with joint space narrowing and osteophytes. No significant joint effusions are identified. There is no evidence of osteomyelitis within the lower leg or foot. Right thigh findings are dictated separately. Ligaments Suboptimally assessed by CT. Muscles and Tendons Generalized muscular atrophy throughout the lower leg and foot. As evaluated by CT, the quadriceps and patellar tendons are intact. The ankle tendons appear intact without significant tenosynovitis. Soft tissues There is subcutaneous edema within the lower leg, greatest distally. Foot edema is greatest dorsally. No focal fluid collection, soft tissue emphysema or unexpected foreign bodies are identified in the lower leg or foot. IMPRESSION: 1. Nonspecific subcutaneous edema within the lower leg and foot, greatest dorsally. No focal fluid collection, soft tissue emphysema or  unexpected foreign bodies identified. 2. No evidence of osteomyelitis. 3. Posttraumatic deformity of the lateral tibial plateau status post ORIF. 4. Generalized muscular atrophy. 5. Advanced degenerative changes at the 1st metatarsophalangeal joint. Electronically Signed   By: Carey Bullocks M.D.   On: 05/06/2023 16:52   CT FEMUR RIGHT WO CONTRAST  Result Date: 05/09/2023 CLINICAL DATA:  Soft tissue infection suspected. History of right femur fracture with ORIF 02/03/2022. EXAM: CT OF THE LOWER RIGHT EXTREMITY WITHOUT CONTRAST TECHNIQUE: Multidetector CT imaging of the right thigh was performed according to the standard protocol. RADIATION DOSE REDUCTION: This exam was performed according to the departmental dose-optimization program which includes automated exposure control, adjustment of the mA and/or kV according to patient size and/or use of iterative reconstruction technique. COMPARISON:  Right femur radiographs 02/03/2022. Pelvic CT 04/11/2023. FINDINGS: Bones/Joint/Cartilage Status post right femoral intramedullary nail fixation of a comminuted fracture involving the distal femoral diaphysis. The hardware is intact without loosening. The distal femur fracture appears healed with bridging bone and mild medial displacement at the fracture by 8 mm. No evidence of acute fracture or dislocation. No evidence of osteomyelitis. Lower leg findings are dictated separately. There is no significant knee joint effusion. Mild right hip degenerative changes without significant hip joint effusion. Ligaments Suboptimally assessed by CT. Muscles and Tendons Generalized muscular atrophy noted. No intramuscular fluid collection identified. The quadriceps and patellar tendons are intact. Soft tissues Mild skin thickening and subcutaneous edema throughout the right thigh. The skin thickening appears greatest laterally in the proximal thigh and posteromedially in the distal thigh. Peripherally dense soft tissue nodule  superiorly in the popliteal fossa measures 2.5 x 1.7 cm on image 716/5, possibly a lymph node. Prominent external iliac and right inguinal lymph nodes are also noted, similar to recent pelvic CT. No focal fluid collections identified. There are diffuse vascular calcifications. IMPRESSION: 1. Nonspecific skin thickening and subcutaneous edema throughout the right thigh, most consistent with cellulitis. No focal fluid collections identified. 2. No evidence of osteomyelitis or septic arthritis. 3. Previous ORIF of a comminuted distal femoral fracture. The fracture appears healed with bridging bone. 4. Prominent pelvic and right inguinal lymph nodes. Possible lymph node in the superior aspect of the popliteal fossa. These lymph nodes are nonspecific and possibly reactive. 5. Lower leg findings are dictated separately. Electronically Signed   By: Carey Bullocks M.D.   On: 04/24/2023 16:40   CT CHEST ABDOMEN PELVIS WO CONTRAST  Result Date: 05/18/2023 CLINICAL DATA:  67 year old male with altered mental status, no speech. Possible sepsis. Recently diagnosed with liver masses. EXAM: CT CHEST,  of the fibular neck noted. There is ossification between the distal tibia and fibula consistent with an old injury of the syndesmosis. Advanced degenerative changes are present at the 1st metatarsophalangeal joint with joint space narrowing and osteophytes. No significant joint effusions are identified. There is no evidence of osteomyelitis within the lower leg or foot. Right thigh findings are dictated separately. Ligaments Suboptimally assessed by CT. Muscles and Tendons Generalized muscular atrophy throughout the lower leg and foot. As evaluated by CT, the quadriceps and patellar tendons are intact. The ankle tendons appear intact without significant tenosynovitis. Soft tissues There is subcutaneous edema within the lower leg, greatest distally. Foot edema is greatest dorsally. No focal fluid collection, soft tissue emphysema or unexpected foreign bodies are identified in the lower leg or foot. IMPRESSION: 1. Nonspecific subcutaneous edema within the lower leg and foot, greatest dorsally. No focal fluid collection, soft tissue emphysema or  unexpected foreign bodies identified. 2. No evidence of osteomyelitis. 3. Posttraumatic deformity of the lateral tibial plateau status post ORIF. 4. Generalized muscular atrophy. 5. Advanced degenerative changes at the 1st metatarsophalangeal joint. Electronically Signed   By: Carey Bullocks M.D.   On: 05/06/2023 16:52   CT FEMUR RIGHT WO CONTRAST  Result Date: 05/09/2023 CLINICAL DATA:  Soft tissue infection suspected. History of right femur fracture with ORIF 02/03/2022. EXAM: CT OF THE LOWER RIGHT EXTREMITY WITHOUT CONTRAST TECHNIQUE: Multidetector CT imaging of the right thigh was performed according to the standard protocol. RADIATION DOSE REDUCTION: This exam was performed according to the departmental dose-optimization program which includes automated exposure control, adjustment of the mA and/or kV according to patient size and/or use of iterative reconstruction technique. COMPARISON:  Right femur radiographs 02/03/2022. Pelvic CT 04/11/2023. FINDINGS: Bones/Joint/Cartilage Status post right femoral intramedullary nail fixation of a comminuted fracture involving the distal femoral diaphysis. The hardware is intact without loosening. The distal femur fracture appears healed with bridging bone and mild medial displacement at the fracture by 8 mm. No evidence of acute fracture or dislocation. No evidence of osteomyelitis. Lower leg findings are dictated separately. There is no significant knee joint effusion. Mild right hip degenerative changes without significant hip joint effusion. Ligaments Suboptimally assessed by CT. Muscles and Tendons Generalized muscular atrophy noted. No intramuscular fluid collection identified. The quadriceps and patellar tendons are intact. Soft tissues Mild skin thickening and subcutaneous edema throughout the right thigh. The skin thickening appears greatest laterally in the proximal thigh and posteromedially in the distal thigh. Peripherally dense soft tissue nodule  superiorly in the popliteal fossa measures 2.5 x 1.7 cm on image 716/5, possibly a lymph node. Prominent external iliac and right inguinal lymph nodes are also noted, similar to recent pelvic CT. No focal fluid collections identified. There are diffuse vascular calcifications. IMPRESSION: 1. Nonspecific skin thickening and subcutaneous edema throughout the right thigh, most consistent with cellulitis. No focal fluid collections identified. 2. No evidence of osteomyelitis or septic arthritis. 3. Previous ORIF of a comminuted distal femoral fracture. The fracture appears healed with bridging bone. 4. Prominent pelvic and right inguinal lymph nodes. Possible lymph node in the superior aspect of the popliteal fossa. These lymph nodes are nonspecific and possibly reactive. 5. Lower leg findings are dictated separately. Electronically Signed   By: Carey Bullocks M.D.   On: 04/24/2023 16:40   CT CHEST ABDOMEN PELVIS WO CONTRAST  Result Date: 05/18/2023 CLINICAL DATA:  67 year old male with altered mental status, no speech. Possible sepsis. Recently diagnosed with liver masses. EXAM: CT CHEST,  no speech. Possible sepsis. EXAM: CT HEAD WITHOUT CONTRAST TECHNIQUE: Contiguous axial images were obtained from the base of the skull through the vertex without intravenous contrast. RADIATION DOSE REDUCTION: This exam was performed according to the departmental dose-optimization program which includes automated exposure control, adjustment of the mA and/or kV according to patient size and/or use of iterative reconstruction  technique. COMPARISON:  None Available. FINDINGS: Brain: Study is mildly degraded by motion artifact despite repeated imaging attempts. No midline shift, mass effect, or evidence of intracranial mass lesion. No ventriculomegaly. Cerebral volume is probably normal for age. No acute intracranial hemorrhage identified. No cortically based acute infarct identified. There is patchy, moderate bilateral periventricular white matter hypodensity. And there is asymmetric heterogeneity in the thalami, greater on the left (series 2, image 18) compatible with age indeterminate small vessel infarcts. However, on the coronal view the left thalamus hypodensity has a chronic appearance. No superimposed No cortically based acute infarct identified. Vascular: Calcified atherosclerosis at the skull base. No suspicious intracranial vascular hyperdensity. Skull: Pronounced skull base motion artifact. No acute osseous abnormality is evident. Sinuses/Orbits: Motion artifact. No definite sinus or mastoid opacification. Other: Orbits are obscured by motion. Visualized scalp soft tissues are within normal limits. IMPRESSION: 1. Motion degraded exam. 2. 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 3. No other acute intracranial abnormality. Electronically Signed   By: Odessa Fleming M.D.   On: 04/28/2023 05:20   DG Chest Port 1 View  Result Date: 04/23/2023 CLINICAL DATA:  Questionable sepsis. EXAM: PORTABLE CHEST 1 VIEW COMPARISON:  Portable chest 04/17/2023 FINDINGS: The lungs are expiratory obscuring the lung bases. The visualized lungs are clear. No pleural effusion is seen. 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 Right IJ dialysis catheter again terminates in the right atrium. No new osseous findings. IMPRESSION: Limited study due to expiratory technique. The visualized lungs clear  but the lung bases are not seen due to expiration. Stable cardiomegaly and aortic atherosclerosis. Electronically Signed   By: Almira Bar M.D.   On: 05/12/2023 04:12     Medications:    sodium chloride Stopped (05/02/23 0748)   norepinephrine (LEVOPHED) Adult infusion 8 mcg/min (05/02/23 1000)   piperacillin-tazobactam (ZOSYN)  IV 12.5 mL/hr at 05/02/23 1000   PrismaSol BGK 2/3.5 400 mL/hr at 05/02/23 0216   PrismaSol BGK 2/3.5 400 mL/hr at 05/02/23 0215   PrismaSol BGK 2/3.5 1,500 mL/hr at 05/02/23 5784   vancomycin 200 mL/hr at 05/02/23 1000   vasopressin 0.03 Units/min (05/02/23 1000)      vitamin C  500 mg Oral BID   Chlorhexidine Gluconate Cloth  6 each Topical Daily   feeding supplement (NEPRO CARB STEADY)  237 mL Oral TID BM   heparin injection (subcutaneous)  5,000 Units Subcutaneous Q12H   hydrocortisone sod succinate (SOLU-CORTEF) inj  100 mg Intravenous Q8H   insulin aspart  0-6 Units Subcutaneous Q4H   multivitamin  1 tablet Per Tube QHS   pantoprazole (PROTONIX) IV  40 mg Intravenous Q12H   docusate sodium, fentaNYL (SUBLIMAZE) injection, midazolam, mouth rinse, polyethylene glycol  Assessment/ Plan:  [name] is a 67 y.o.  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 There is ossification between the distal tibia and fibula consistent with an old injury of the syndesmosis. Advanced degenerative changes are present at the 1st metatarsophalangeal joint with joint space narrowing and osteophytes. No significant joint effusions are identified. There is no evidence of osteomyelitis within the lower leg or foot. Right thigh findings are dictated separately. Ligaments Suboptimally assessed by CT. Muscles and Tendons Generalized muscular atrophy throughout the lower leg and foot. As evaluated by CT, the quadriceps and patellar tendons are intact. The ankle tendons appear intact without significant tenosynovitis. Soft tissues There is subcutaneous edema within the lower leg, greatest distally. Foot edema is greatest dorsally. No focal fluid collection, soft tissue emphysema or unexpected foreign bodies are identified in the lower leg or foot. IMPRESSION: 1. Nonspecific subcutaneous edema within the lower leg and foot, greatest dorsally. No focal fluid collection, soft tissue emphysema or  unexpected foreign bodies identified. 2. No evidence of osteomyelitis. 3. Posttraumatic deformity of the lateral tibial plateau status post ORIF. 4. Generalized muscular atrophy. 5. Advanced degenerative changes at the 1st metatarsophalangeal joint. Electronically Signed   By: Carey Bullocks M.D.   On: 05/06/2023 16:52   CT FEMUR RIGHT WO CONTRAST  Result Date: 05/09/2023 CLINICAL DATA:  Soft tissue infection suspected. History of right femur fracture with ORIF 02/03/2022. EXAM: CT OF THE LOWER RIGHT EXTREMITY WITHOUT CONTRAST TECHNIQUE: Multidetector CT imaging of the right thigh was performed according to the standard protocol. RADIATION DOSE REDUCTION: This exam was performed according to the departmental dose-optimization program which includes automated exposure control, adjustment of the mA and/or kV according to patient size and/or use of iterative reconstruction technique. COMPARISON:  Right femur radiographs 02/03/2022. Pelvic CT 04/11/2023. FINDINGS: Bones/Joint/Cartilage Status post right femoral intramedullary nail fixation of a comminuted fracture involving the distal femoral diaphysis. The hardware is intact without loosening. The distal femur fracture appears healed with bridging bone and mild medial displacement at the fracture by 8 mm. No evidence of acute fracture or dislocation. No evidence of osteomyelitis. Lower leg findings are dictated separately. There is no significant knee joint effusion. Mild right hip degenerative changes without significant hip joint effusion. Ligaments Suboptimally assessed by CT. Muscles and Tendons Generalized muscular atrophy noted. No intramuscular fluid collection identified. The quadriceps and patellar tendons are intact. Soft tissues Mild skin thickening and subcutaneous edema throughout the right thigh. The skin thickening appears greatest laterally in the proximal thigh and posteromedially in the distal thigh. Peripherally dense soft tissue nodule  superiorly in the popliteal fossa measures 2.5 x 1.7 cm on image 716/5, possibly a lymph node. Prominent external iliac and right inguinal lymph nodes are also noted, similar to recent pelvic CT. No focal fluid collections identified. There are diffuse vascular calcifications. IMPRESSION: 1. Nonspecific skin thickening and subcutaneous edema throughout the right thigh, most consistent with cellulitis. No focal fluid collections identified. 2. No evidence of osteomyelitis or septic arthritis. 3. Previous ORIF of a comminuted distal femoral fracture. The fracture appears healed with bridging bone. 4. Prominent pelvic and right inguinal lymph nodes. Possible lymph node in the superior aspect of the popliteal fossa. These lymph nodes are nonspecific and possibly reactive. 5. Lower leg findings are dictated separately. Electronically Signed   By: Carey Bullocks M.D.   On: 04/24/2023 16:40   CT CHEST ABDOMEN PELVIS WO CONTRAST  Result Date: 05/18/2023 CLINICAL DATA:  67 year old male with altered mental status, no speech. Possible sepsis. Recently diagnosed with liver masses. EXAM: CT CHEST,  ARM  Final   Special Requests   Final    BOTTLES DRAWN AEROBIC AND ANAEROBIC Blood Culture adequate volume   Culture   Final    NO GROWTH 1 DAY Performed at [facility], [address] [place], [place] [address]    Report Status PENDING  Incomplete  Blood Culture (routine x 2)     Status: None (Preliminary result)   Collection Time: 05/19/2023  3:34 AM   Specimen: BLOOD LEFT ARM  Result Value Ref Range Status   Specimen Description BLOOD LEFT ARM  Final   Special Requests   Final    BOTTLES DRAWN AEROBIC AND ANAEROBIC Blood Culture adequate volume   Culture   Final    NO  GROWTH 1 DAY Performed at [facility], [address], [place], [place] [address]    Report Status PENDING  Incomplete  Gastrointestinal Panel by PCR , Stool     Status: Abnormal   Collection Time: 05/17/2023  3:34 AM   Specimen: Stool  Result Value Ref Range Status   Campylobacter species NOT DETECTED NOT DETECTED Final   Plesimonas shigelloides NOT DETECTED NOT DETECTED Final   Salmonella species NOT DETECTED NOT DETECTED Final   Yersinia enterocolitica NOT DETECTED NOT DETECTED Final   Vibrio species NOT DETECTED NOT DETECTED Final   Vibrio cholerae NOT DETECTED NOT DETECTED Final   Enteroaggregative E coli (EAEC) DETECTED (A) NOT DETECTED Final    Comment: RESULT CALLED TO, READ BACK BY AND VERIFIED WITH: Encarnacion Slates [facility] 05/19/2023 2130 MW    Enteropathogenic E coli (EPEC) NOT DETECTED NOT DETECTED Final   Enterotoxigenic E coli (ETEC) NOT DETECTED NOT DETECTED Final   Shiga like toxin producing E coli (STEC) NOT DETECTED NOT DETECTED Final   Shigella/Enteroinvasive E coli (EIEC) NOT DETECTED NOT DETECTED Final   Cryptosporidium NOT DETECTED NOT DETECTED Final   Cyclospora cayetanensis NOT DETECTED NOT DETECTED Final   Entamoeba histolytica NOT DETECTED NOT DETECTED Final   Giardia lamblia NOT DETECTED NOT DETECTED Final   Adenovirus F40/41 NOT DETECTED NOT DETECTED Final   Astrovirus NOT DETECTED NOT DETECTED Final   Norovirus GI/GII NOT DETECTED NOT DETECTED Final   Rotavirus A NOT DETECTED NOT DETECTED Final   Sapovirus (I, II, IV, and V) NOT DETECTED NOT DETECTED Final    Comment: Performed at [facility], [address] [place], [place] [address]  C Difficile Quick Screen w PCR reflex     Status: None   Collection Time: 05/14/2023  3:34 AM   Specimen: STOOL  Result Value Ref Range Status   C Diff antigen NEGATIVE NEGATIVE Final   C Diff toxin NEGATIVE NEGATIVE Final   C Diff interpretation No C. difficile detected.  Final    Comment: Performed at [facility], [address] [place], [place] [address]  MRSA Next Gen by PCR, Nasal     Status: None   Collection Time: 05/11/2023 10:56 AM   Specimen: Nasal Mucosa; Nasal Swab  Result Value Ref Range Status   MRSA by PCR Next Gen NOT DETECTED NOT DETECTED Final    Comment: (NOTE) The GeneXpert MRSA Assay (FDA approved for NASAL specimens only), is one component of a comprehensive MRSA colonization surveillance program. It is not intended to diagnose MRSA infection nor to guide or monitor treatment for MRSA infections. Test performance is not FDA approved in patients less than 50 years old. Performed at [facility], [address], [place], [place] [address]  no speech. Possible sepsis. EXAM: CT HEAD WITHOUT CONTRAST TECHNIQUE: Contiguous axial images were obtained from the base of the skull through the vertex without intravenous contrast. RADIATION DOSE REDUCTION: This exam was performed according to the departmental dose-optimization program which includes automated exposure control, adjustment of the mA and/or kV according to patient size and/or use of iterative reconstruction  technique. COMPARISON:  None Available. FINDINGS: Brain: Study is mildly degraded by motion artifact despite repeated imaging attempts. No midline shift, mass effect, or evidence of intracranial mass lesion. No ventriculomegaly. Cerebral volume is probably normal for age. No acute intracranial hemorrhage identified. No cortically based acute infarct identified. There is patchy, moderate bilateral periventricular white matter hypodensity. And there is asymmetric heterogeneity in the thalami, greater on the left (series 2, image 18) compatible with age indeterminate small vessel infarcts. However, on the coronal view the left thalamus hypodensity has a chronic appearance. No superimposed No cortically based acute infarct identified. Vascular: Calcified atherosclerosis at the skull base. No suspicious intracranial vascular hyperdensity. Skull: Pronounced skull base motion artifact. No acute osseous abnormality is evident. Sinuses/Orbits: Motion artifact. No definite sinus or mastoid opacification. Other: Orbits are obscured by motion. Visualized scalp soft tissues are within normal limits. IMPRESSION: 1. Motion degraded exam. 2. 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 3. No other acute intracranial abnormality. Electronically Signed   By: Odessa Fleming M.D.   On: 04/28/2023 05:20   DG Chest Port 1 View  Result Date: 04/23/2023 CLINICAL DATA:  Questionable sepsis. EXAM: PORTABLE CHEST 1 VIEW COMPARISON:  Portable chest 04/17/2023 FINDINGS: The lungs are expiratory obscuring the lung bases. The visualized lungs are clear. No pleural effusion is seen. 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 Right IJ dialysis catheter again terminates in the right atrium. No new osseous findings. IMPRESSION: Limited study due to expiratory technique. The visualized lungs clear  but the lung bases are not seen due to expiration. Stable cardiomegaly and aortic atherosclerosis. Electronically Signed   By: Almira Bar M.D.   On: 05/12/2023 04:12     Medications:    sodium chloride Stopped (05/02/23 0748)   norepinephrine (LEVOPHED) Adult infusion 8 mcg/min (05/02/23 1000)   piperacillin-tazobactam (ZOSYN)  IV 12.5 mL/hr at 05/02/23 1000   PrismaSol BGK 2/3.5 400 mL/hr at 05/02/23 0216   PrismaSol BGK 2/3.5 400 mL/hr at 05/02/23 0215   PrismaSol BGK 2/3.5 1,500 mL/hr at 05/02/23 5784   vancomycin 200 mL/hr at 05/02/23 1000   vasopressin 0.03 Units/min (05/02/23 1000)      vitamin C  500 mg Oral BID   Chlorhexidine Gluconate Cloth  6 each Topical Daily   feeding supplement (NEPRO CARB STEADY)  237 mL Oral TID BM   heparin injection (subcutaneous)  5,000 Units Subcutaneous Q12H   hydrocortisone sod succinate (SOLU-CORTEF) inj  100 mg Intravenous Q8H   insulin aspart  0-6 Units Subcutaneous Q4H   multivitamin  1 tablet Per Tube QHS   pantoprazole (PROTONIX) IV  40 mg Intravenous Q12H   docusate sodium, fentaNYL (SUBLIMAZE) injection, midazolam, mouth rinse, polyethylene glycol  Assessment/ Plan:  [name] is a 67 y.o.  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 Possible sepsis. EXAM: CT HEAD WITHOUT CONTRAST TECHNIQUE: Contiguous axial images were obtained from the base of the skull through the vertex without intravenous contrast. RADIATION DOSE REDUCTION: This exam was performed according to the departmental dose-optimization program which includes automated exposure control, adjustment of the mA and/or kV according to patient size and/or use of iterative reconstruction  technique. COMPARISON:  None Available. FINDINGS: Brain: Study is mildly degraded by motion artifact despite repeated imaging attempts. No midline shift, mass effect, or evidence of intracranial mass lesion. No ventriculomegaly. Cerebral volume is probably normal for age. No acute intracranial hemorrhage identified. No cortically based acute infarct identified. There is patchy, moderate bilateral periventricular white matter hypodensity. And there is asymmetric heterogeneity in the thalami, greater on the left (series 2, image 18) compatible with age indeterminate small vessel infarcts. However, on the coronal view the left thalamus hypodensity has a chronic appearance. No superimposed No cortically based acute infarct identified. Vascular: Calcified atherosclerosis at the skull base. No suspicious intracranial vascular hyperdensity. Skull: Pronounced skull base motion artifact. No acute osseous abnormality is evident. Sinuses/Orbits: Motion artifact. No definite sinus or mastoid opacification. Other: Orbits are obscured by motion. Visualized scalp soft tissues are within normal limits. IMPRESSION: 1. Motion degraded exam. 2. 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 3. No other acute intracranial abnormality. Electronically Signed   By: Odessa Fleming M.D.   On: 04/28/2023 05:20   DG Chest Port 1 View  Result Date: 04/23/2023 CLINICAL DATA:  Questionable sepsis. EXAM: PORTABLE CHEST 1 VIEW COMPARISON:  Portable chest 04/17/2023 FINDINGS: The lungs are expiratory obscuring the lung bases. The visualized lungs are clear. No pleural effusion is seen. 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 Right IJ dialysis catheter again terminates in the right atrium. No new osseous findings. IMPRESSION: Limited study due to expiratory technique. The visualized lungs clear  but the lung bases are not seen due to expiration. Stable cardiomegaly and aortic atherosclerosis. Electronically Signed   By: Almira Bar M.D.   On: 05/12/2023 04:12     Medications:    sodium chloride Stopped (05/02/23 0748)   norepinephrine (LEVOPHED) Adult infusion 8 mcg/min (05/02/23 1000)   piperacillin-tazobactam (ZOSYN)  IV 12.5 mL/hr at 05/02/23 1000   PrismaSol BGK 2/3.5 400 mL/hr at 05/02/23 0216   PrismaSol BGK 2/3.5 400 mL/hr at 05/02/23 0215   PrismaSol BGK 2/3.5 1,500 mL/hr at 05/02/23 5784   vancomycin 200 mL/hr at 05/02/23 1000   vasopressin 0.03 Units/min (05/02/23 1000)      vitamin C  500 mg Oral BID   Chlorhexidine Gluconate Cloth  6 each Topical Daily   feeding supplement (NEPRO CARB STEADY)  237 mL Oral TID BM   heparin injection (subcutaneous)  5,000 Units Subcutaneous Q12H   hydrocortisone sod succinate (SOLU-CORTEF) inj  100 mg Intravenous Q8H   insulin aspart  0-6 Units Subcutaneous Q4H   multivitamin  1 tablet Per Tube QHS   pantoprazole (PROTONIX) IV  40 mg Intravenous Q12H   docusate sodium, fentaNYL (SUBLIMAZE) injection, midazolam, mouth rinse, polyethylene glycol  Assessment/ Plan:  [name] is a 67 y.o.  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 There is ossification between the distal tibia and fibula consistent with an old injury of the syndesmosis. Advanced degenerative changes are present at the 1st metatarsophalangeal joint with joint space narrowing and osteophytes. No significant joint effusions are identified. There is no evidence of osteomyelitis within the lower leg or foot. Right thigh findings are dictated separately. Ligaments Suboptimally assessed by CT. Muscles and Tendons Generalized muscular atrophy throughout the lower leg and foot. As evaluated by CT, the quadriceps and patellar tendons are intact. The ankle tendons appear intact without significant tenosynovitis. Soft tissues There is subcutaneous edema within the lower leg, greatest distally. Foot edema is greatest dorsally. No focal fluid collection, soft tissue emphysema or unexpected foreign bodies are identified in the lower leg or foot. IMPRESSION: 1. Nonspecific subcutaneous edema within the lower leg and foot, greatest dorsally. No focal fluid collection, soft tissue emphysema or  unexpected foreign bodies identified. 2. No evidence of osteomyelitis. 3. Posttraumatic deformity of the lateral tibial plateau status post ORIF. 4. Generalized muscular atrophy. 5. Advanced degenerative changes at the 1st metatarsophalangeal joint. Electronically Signed   By: Carey Bullocks M.D.   On: 05/06/2023 16:52   CT FEMUR RIGHT WO CONTRAST  Result Date: 05/09/2023 CLINICAL DATA:  Soft tissue infection suspected. History of right femur fracture with ORIF 02/03/2022. EXAM: CT OF THE LOWER RIGHT EXTREMITY WITHOUT CONTRAST TECHNIQUE: Multidetector CT imaging of the right thigh was performed according to the standard protocol. RADIATION DOSE REDUCTION: This exam was performed according to the departmental dose-optimization program which includes automated exposure control, adjustment of the mA and/or kV according to patient size and/or use of iterative reconstruction technique. COMPARISON:  Right femur radiographs 02/03/2022. Pelvic CT 04/11/2023. FINDINGS: Bones/Joint/Cartilage Status post right femoral intramedullary nail fixation of a comminuted fracture involving the distal femoral diaphysis. The hardware is intact without loosening. The distal femur fracture appears healed with bridging bone and mild medial displacement at the fracture by 8 mm. No evidence of acute fracture or dislocation. No evidence of osteomyelitis. Lower leg findings are dictated separately. There is no significant knee joint effusion. Mild right hip degenerative changes without significant hip joint effusion. Ligaments Suboptimally assessed by CT. Muscles and Tendons Generalized muscular atrophy noted. No intramuscular fluid collection identified. The quadriceps and patellar tendons are intact. Soft tissues Mild skin thickening and subcutaneous edema throughout the right thigh. The skin thickening appears greatest laterally in the proximal thigh and posteromedially in the distal thigh. Peripherally dense soft tissue nodule  superiorly in the popliteal fossa measures 2.5 x 1.7 cm on image 716/5, possibly a lymph node. Prominent external iliac and right inguinal lymph nodes are also noted, similar to recent pelvic CT. No focal fluid collections identified. There are diffuse vascular calcifications. IMPRESSION: 1. Nonspecific skin thickening and subcutaneous edema throughout the right thigh, most consistent with cellulitis. No focal fluid collections identified. 2. No evidence of osteomyelitis or septic arthritis. 3. Previous ORIF of a comminuted distal femoral fracture. The fracture appears healed with bridging bone. 4. Prominent pelvic and right inguinal lymph nodes. Possible lymph node in the superior aspect of the popliteal fossa. These lymph nodes are nonspecific and possibly reactive. 5. Lower leg findings are dictated separately. Electronically Signed   By: Carey Bullocks M.D.   On: 04/24/2023 16:40   CT CHEST ABDOMEN PELVIS WO CONTRAST  Result Date: 05/18/2023 CLINICAL DATA:  67 year old male with altered mental status, no speech. Possible sepsis. Recently diagnosed with liver masses. EXAM: CT CHEST,

## 2023-05-02 NOTE — Consult Note (Signed)
PHARMACY CONSULT NOTE  Pharmacy Consult for Electrolyte Monitoring and Replacement   Recent Labs: Potassium (mmol/L)  Date Value  05/02/2023 4.0   Magnesium (mg/dL)  Date Value  29/56/2130 2.2   Calcium (mg/dL)  Date Value  86/57/8469 8.4 (L)   Albumin (g/dL)  Date Value  62/95/2841 2.2 (L)  05/02/2023 2.2 (L)   Phosphorus (mg/dL)  Date Value  32/44/0102 4.3   Sodium (mmol/L)  Date Value  05/02/2023 134 (L)   Assessment: 67 y/o M with medical history including ESRD on HD, metastatic prostate cancer on ADT, CAD s/p CABG, ischemic cardiomyopathy, Afib on apixaban, DM, HTN, duodenal ulcers, cognitive deficits, alcohol use disorder admitted with encephalopathy in setting of sepsis / uremia.   Patient currently on CRRT as of 04/29/2023  Goal of Therapy:  Electrolytes within normal limits  Plan:  --No electrolyte supplementation needed today --K 4.0, management with CRRT K+ bath changed from 2.0 to 4.0, per nephrology --Re-check labs in afternoon  Darolyn Rua, PharmD Student [employer]

## 2023-05-02 NOTE — Progress Notes (Signed)
Initial Nutrition Assessment  DOCUMENTATION CODES:   Not applicable  INTERVENTION:   Nepro Shake po TID, each supplement provides 425 kcal and 19 grams protein  Rena-vit po daily   Vitamin C 500mg  po BID  Pt at high refeed risk; recommend monitor potassium, magnesium and phosphorus labs daily until stable  Daily weights   NUTRITION DIAGNOSIS:   Increased nutrient needs related to chronic illness (ESRD on HD, metastatic cancer) as evidenced by estimated needs.  GOAL:   Patient will meet greater than or equal to 90% of their needs  MONITOR:   PO intake, Supplement acceptance, Labs, Weight trends, I & O's, Skin  REASON FOR ASSESSMENT:   Consult Assessment of nutrition requirement/status  ASSESSMENT:   66 y.o. male with medical history significant of ESRD secondary to immune mediated GN on HD, metastatic prostate cancer on indefinite ADT, CAD s/p CABG, ischemic cardiomyopathy, atrial fibrillation on Eliquis, type 2 diabetes, hypertension, duodenal ulcers, cognitive deficits, alcohol use disorder, CHF, renal mass, HLD and R ORIF x 2 (2023) who is admitted with altered mental status, R leg cellulitis, acute liver failure and septic shock.  Visited pt's room today. Pt is altered and unable to provide any history. Pt receiving CRRT. Pt with poor oral intake during his last admission; pt eating <25% of meals. Pt with poor oral intake this admission. Pt is ordered for supplements but appears too lethargic to eat or drink. Per chart, it is difficult to determine if pt has had any recent significant weight loss as pt's chart weights have varied. Palliative care is following. RD will monitor for GOC. May need to consider NGT placement and nutrition support if plan is for full aggressive care.   Medications reviewed and include: vitamin C, solu-cortef, insulin, rena-vit, protonix, levophed, zosyn, vasopressin   Labs reviewed: Na 134(L), K 4.0 wnl, BUN 42(H), creat 3.35(H), P 4.3 wnl, Mg  2.2 wnl, alk phos 412(H), AST 2233(H), ALT 877(H), tbili 3.1(H) Hgb 10.9(L), Hct 33.2(L) Cbgs- 157, 132, 140, 139 x 24 hrs AIC 6.1(H)- 9/9  NUTRITION - FOCUSED PHYSICAL EXAM:  Flowsheet Row Most Recent Value  Orbital Region No depletion  Upper Arm Region No depletion  Thoracic and Lumbar Region No depletion  Buccal Region No depletion  Temple Region No depletion  Clavicle Bone Region Mild depletion  Clavicle and Acromion Bone Region Mild depletion  Scapular Bone Region No depletion  Dorsal Hand Unable to assess  Patellar Region Mild depletion  Anterior Thigh Region Mild depletion  Posterior Calf Region Mild depletion  Edema (RD Assessment) Mild  Hair Reviewed  Eyes Reviewed  Mouth Reviewed  Skin Reviewed  Nails Reviewed   Diet Order:   Diet Order     None      EDUCATION NEEDS:   No education needs have been identified at this time  Skin:  Skin Assessment: Reviewed RN Assessment (R leg cellulitis, skin tears, pressure injuries toe & ankle)  Last BM:  9/10- type 6- mucous per RN  Height:   Ht Readings from Last 1 Encounters:  04/28/2023 5\' 9"  (1.753 m)    Weight:   Wt Readings from Last 1 Encounters:  05/02/23 79.8 kg    Ideal Body Weight:  72.7 kg  BMI:  Body mass index is 25.98 kg/m.  Estimated Nutritional Needs:   Kcal:  2000-2300kcal/day  Protein:  100-115g/day  Fluid:  UOP +1L  Betsey Holiday MS, RD, LDN Please refer to [facility] for RD and/or RD on-call/weekend/after hours pager

## 2023-05-02 NOTE — Progress Notes (Signed)
NAME:  Micheal Aguirre, MRN:  [id number], DOB:  20-Jun-1956, LOS: 1 ADMISSION DATE:  05-26-23, CONSULTATION DATE:  05-26-2023 REFERRING MD:  Rochele Raring, CHIEF COMPLAINT:  Altered Mental Status    HPI  HPI: Micheal Aguirre is a 67 y.o. 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  On review of chart patient was recently admitted from 04/17/2023 to 04/25/2023 with cellulitis of right leg for which she completed 8 days of IV antibiotics and was discharged on Keflex for additional 5 days.    ED Course: Initial vital signs showed HR of beats/minute, BP 117/81 mm Hg, the RR 17 breaths/minute, and the oxygen saturation 98% on and a temperature of 95.62F (35.2C).  Na+/ K+: 130/7.4 Glucose: 182 BUN/Cr.:75/6.68 Calcium: 7.1 AST/ALT:808/403, CO2 18, Anion Gap 22 WBC: 9.4 Hgb/Hct: 11.7/36.0 Plts: 77 PCT:79.84 Lactic acid: 4.8 COVID PCR: Negative,  VBG: pO2 34; pCO2 35; pH 7.31;  HCO3 17.6, %O2 Sat 52.3.  CXR> CTH> CTA Chest> CT Abd/pelvis>see below  Patient given 30 cc/kg of fluids and started on broad-spectrum antibiotics Vanco cefepime and Flagyl for sepsis with septic shock. Patient remained hypotensive despite IVF boluses therefore was started on Levophed.  (Sepsis reassessment completed). PCCM consulted.  Past Medical History  ESRD 2/2 immune mediated GN on HD, metastatic prostate cancer on indefinite ADT, CAD s/p CABG, ischemic cardiomyopathy, atrial fibrillation on Eliquis, type 2 diabetes, hypertension, chronic renal disease, duodenal ulcers, cognitive deficits, history of alcohol use disorder  Significant Hospital Events   9/9:Admit with metabolic encephalopathy in the setting of severe  sepsis/uremia 05-26-23 family discussion LIMITED CODE 26-May-2023 CT scans of RT leg c/w cellulitis 9/10 remains metabolic encephalopathy in the setting of severe sepsis/uremia LIVER FAILURE  Consults:  Nephrology  Procedures:  None  Significant Diagnostic Tests:  May 26, 2023: Chest Xray>IMPRESSION: Limited study due to expiratory technique. The visualized lungs clear but the lung bases are not seen due to expiration. Stable cardiomegaly and aortic atherosclerosis.  05/26/2023: Noncontrast CT head>IMPRESSION: 1. Motion degraded exam. 2. 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 3. No other acute intracranial abnormality.  IMPRESSION: 1. Progressed hepatomegaly and liver extensive infiltration by indistinct hypodense masses since CT last month. Unclear whether this is Primary Versus Metastatic Liver Tumor. But there is associated Pathologic Retroperitoneal and Pelvic/inguinal Lymphadenopathy. Plus diffusely heterogeneous bone mineralization raising the possibility of skeletal metastases.   2. But no superimposed new/acute or inflammatory process is identified in the noncontrast chest, abdomen, or pelvis.   3.  Aortic Atherosclerosis (ICD10-I70.0). May 26, 2023: CT Chest, abdomen and pelvis>  Interim History / Subjective:    Remains encephalopathic Remains on pressors +liver failure High risk for bleeding Moans and groans in pain Patient is in the dying process-explained to daughter  Micro Data:  2023/05/26: SARS-CoV-2 PCR> negative 05-26-2023: Influenza PCR> negative 05/26/2023: Blood culture x2> 05-26-2023: MRSA PCR>>   Antimicrobials:   Antibiotics Given (last 72 hours)     Date/Time Action Medication Dose Rate   26-May-2023 0423 New Bag/Given   ceFEPIme (MAXIPIME) 2 g in sodium chloride 0.9 % 100 mL IVPB 2 g 200 mL/hr   May 26, 2023 0515 New Bag/Given   metroNIDAZOLE (FLAGYL) IVPB  500 mg 500 mg 100 mL/hr   05/14/2023 1027 New Bag/Given    vancomycin (VANCOREADY) IVPB 2000 mg/400 mL 2,000 mg 200 mL/hr   05/20/2023 1612 New Bag/Given   piperacillin-tazobactam (ZOSYN) IVPB 3.375 g 3.375 g 12.5 mL/hr   05/02/23 0038 New Bag/Given   piperacillin-tazobactam (ZOSYN) IVPB 3.375 g 3.375 g 12.5 mL/hr        OBJECTIVE  Blood pressure 101/78, pulse 94, temperature 99 F (37.2 C), resp. rate 15, height 5\' 9"  (1.753 m), weight 79.8 kg, SpO2 97%.        Intake/Output Summary (Last 24 hours) at 05/02/2023 0710 Last data filed at 05/02/2023 0700 Gross per 24 hour  Intake 1244.18 ml  Output 708 ml  Net 536.18 ml   Filed Weights   05/21/2023 0313 05/02/23 0500  Weight: 94.1 kg 79.8 kg      REVIEW OF SYSTEMS  PATIENT IS UNABLE TO PROVIDE COMPLETE REVIEW OF SYSTEMS DUE TO SEVERE CRITICAL ILLNESS   PHYSICAL EXAMINATION:  GENERAL:critically ill appearing, +resp distress EYES: Pupils equal, round, reactive to light.  No scleral icterus.  MOUTH: Moist mucosal membrane.  NECK: Supple.  PULMONARY: Lungs clear to auscultation, +rhonchi,  CARDIOVASCULAR: S1 and S2.  Regular rate and rhythm GASTROINTESTINAL: Soft, nontender, -distended. Positive bowel sounds.  MUSCULOSKELETAL: No swelling, clubbing, or edema.  NEUROLOGIC: confused, encephalopathic EXTREMITIES:Right lower extremity: Significant uniform swelling and exquisite tenderness to palpation. Subtle erythema noted particularly over the medial inferior aspect where is skin breakdown with serous drainage. Erythema extends upwards. Increased warmth to touch  Capillary refill < 3 seconds in all extremities. Pulses palpable distally.  SKIN:see below    Labs/imaging that I havepersonally reviewed  (right click and "Reselect all SmartList Selections" daily)     Labs   CBC: Recent Labs  Lab 05/13/2023 0421 05/10/2023 2250 05/02/23 0404  WBC 9.4 9.5 8.6  NEUTROABS 8.8*  --   --   HGB 11.7* 11.4* 10.9*  HCT 36.0* 33.2* 33.2*  MCV 101.7* 97.1 98.8  PLT 77* 71* 63*    Basic  Metabolic Panel: Recent Labs  Lab 05/20/2023 0414 05/05/2023 1102 05/13/2023 1547 05/02/23 0404  NA 130* 132* 133* 134*  K 7.4* 5.3* 5.1 4.0  CL 90* 91* 92* 96*  CO2 18* 21* 21* 23  GLUCOSE 182* 169* 165* 152*  BUN 75* 70* 65* 42*  CREATININE 6.68* 6.31* 5.59* 3.35*  CALCIUM 7.1* 7.1* 7.5* 8.4*  MG  --   --   --  2.2  PHOS  --   --  5.9* 4.3   GFR: Estimated Creatinine Clearance: 21.4 mL/min (A) (by C-G formula based on SCr of 3.35 mg/dL (H)). Recent Labs  Lab 05/10/2023 0414 05/11/2023 0421 05/09/2023 0613 05/13/2023 1102 04/25/2023 1254 04/30/2023 2250 05/02/23 0404  PROCALCITON 79.84  --   --   --   --   --   --   WBC  --  9.4  --   --   --  9.5 8.6  LATICACIDVEN  --   --  5.3* 3.1* 2.2*  --  2.3*    Liver Function Tests: Recent Labs  Lab 05/08/2023 0414 05/07/2023 1547 05/02/23 0404  AST 1,892*  --  2,233*  ALT 808*  --  877*  ALKPHOS 403*  --  412*  BILITOT 3.0*  --  3.1*  PROT 5.7*  --  5.8*  ALBUMIN 2.3* 1.9* 2.2*  2.2*   No results for input(s): "LIPASE", "AMYLASE" in the last 168 hours. No  results for input(s): "AMMONIA" in the last 168 hours.  ABG    Component Value Date/Time   HCO3 17.6 (L) 04/28/2023 0325   ACIDBASEDEF 7.8 (H) 05/10/2023 0325   O2SAT 52.3 05/07/2023 0325     Coagulation Profile: Recent Labs  Lab 05/21/2023 0331 05/02/23 0404  INR 3.7* 3.3*    Cardiac Enzymes: No results for input(s): "CKTOTAL", "CKMB", "CKMBINDEX", "TROPONINI" in the last 168 hours.  HbA1C: Hgb A1c MFr Bld  Date/Time Value Ref Range Status  05/20/2023 04:21 AM 6.1 (H) 4.8 - 5.6 % Final    Comment:    (NOTE) Pre diabetes:          5.7%-6.4%  Diabetes:              >6.4%  Glycemic control for   <7.0% adults with diabetes   11/27/2021 05:59 AM 5.6 4.8 - 5.6 % Final    Comment:    (NOTE) Pre diabetes:          5.7%-6.4%  Diabetes:              >6.4%  Glycemic control for   <7.0% adults with diabetes     CBG: Recent Labs  Lab 04/28/2023 1344  05/13/2023 1545 04/28/2023 2018 05/02/23 0014 05/02/23 0319  GLUCAP 144* 139* 157* 139* 140*   Assessment & Plan:  Admitted for  septic shock due to  Right Leg Cellulitis with acute metabolic encephalopathy with acute liver failure in the setting metastatic cancer  RESP DISTRESS HIGH RISK FOR CARDIAC ARREST AND INTUBATION HIGH CHANCES OF DEATH Oxygen as needed  SEPTIC shock SOURCE-RT LEG, liver mets -use vasopressors to keep MAP>65 as needed -follow ABG and LA as needed -follow up cultures -emperic ABX -consider stress dose steroids -aggressive IV fluid Resuscitation    NEUROLOGY ACUTE METABOLIC ENCEPHALOPATHY -CTH neg -Likely multifactorial in the setting of multiple medical comorbidities including sepsis. -Treat medical conditions as above -Avoid sedatives as able Sedation as needed   CARDIAC Chronic Combined Systolic and Diastolic CHF Ischemic Cardiomyopathy s/p CABG x 3 most recent  04/04/2023 Can NOT start AC due to high risk of bleeding     ESRD ON CRRT -continue Foley Catheter-assess need -Avoid nephrotoxic agents -Follow urine output, BMP -Ensure adequate renal perfusion, optimize oxygenation -Renal dose medications   Intake/Output Summary (Last 24 hours) at 05/02/2023 0719 Last data filed at 05/02/2023 0700 Gross per 24 hour  Intake 1244.18 ml  Output 708 ml  Net 536.18 ml    ACUTE LIVER FAILURE Likely multifactorial in the setting of  liver mets, hepatorenal and severe sepsis as above -CT multiple lesions throughout the liver concerning for mets -Continue to trend -Avoid hepatotoxic This is terminal condition  Hx of prostate cancer with mets to lymph nodes and bones -Oncology following  Best practice:  Diet:  NPO Pain/Anxiety/Delirium protocol (if indicated): No VAP protocol (if indicated): Not indicated DVT prophylaxis: Subcutaneous Heparin GI prophylaxis: N/A Glucose control:  SSI Yes Central venous access:  N/A Arterial line:   N/A Foley:  N/A Mobility:  bed rest  PT consulted: N/A Last date of multidisciplinary goals of care discussion [no family at bedside] Code Status:  LIMITED CODE Disposition: ICU     DVT/GI PRX  assessed I Assessed the need for Labs I Assessed the need for Foley I Assessed the need for Central Venous Line Family Discussion when available I Assessed the need for Mobilization I made an Assessment of medications to be adjusted accordingly Safety Risk assessment  completed  CASE DISCUSSED IN MULTIDISCIPLINARY ROUNDS WITH ICU TEAM     Critical Care Time devoted to patient care services described in this note is 55 minutes.  Critical care was necessary to treat /prevent imminent and life-threatening deterioration. Overall, patient is critically ill, prognosis is guarded.  Patient with Multiorgan failure and at high risk for cardiac arrest and death.    Lucie Leather, M.D.  [facility]  Medical Director [facility] Medical Director [facility] Cardio-Pulmonary Department

## 2023-05-03 DIAGNOSIS — A419 Sepsis, unspecified organism: Secondary | ICD-10-CM | POA: Diagnosis not present

## 2023-05-03 DIAGNOSIS — R6520 Severe sepsis without septic shock: Secondary | ICD-10-CM | POA: Diagnosis not present

## 2023-05-03 DIAGNOSIS — Z7189 Other specified counseling: Secondary | ICD-10-CM | POA: Diagnosis not present

## 2023-05-03 LAB — RENAL FUNCTION PANEL
Albumin: 2.2 g/dL — ABNORMAL LOW (ref 3.5–5.0)
Albumin: 2.5 g/dL — ABNORMAL LOW (ref 3.5–5.0)
Anion gap: 14 (ref 5–15)
Anion gap: 17 — ABNORMAL HIGH (ref 5–15)
BUN: 20 mg/dL (ref 8–23)
BUN: 17 mg/dL (ref 8–23)
CO2: 22 mmol/L (ref 22–32)
CO2: 21 mmol/L — ABNORMAL LOW (ref 22–32)
Calcium: 8.4 mg/dL — ABNORMAL LOW (ref 8.9–10.3)
Calcium: 8.6 mg/dL — ABNORMAL LOW (ref 8.9–10.3)
Chloride: 102 mmol/L (ref 98–111)
Chloride: 99 mmol/L (ref 98–111)
Creatinine, Ser: 1.44 mg/dL — ABNORMAL HIGH (ref 0.61–1.24)
Creatinine, Ser: 1.1 mg/dL (ref 0.61–1.24)
GFR, Estimated: 53 mL/min — ABNORMAL LOW (ref >60)
GFR, Estimated: >60 mL/min (ref >60)
Glucose, Bld: 130 mg/dL — ABNORMAL HIGH (ref 70–99)
Glucose, Bld: 122 mg/dL — ABNORMAL HIGH (ref 70–99)
Phosphorus: 2.9 mg/dL (ref 2.5–4.6)
Phosphorus: 2.2 mg/dL — ABNORMAL LOW (ref 2.5–4.6)
Potassium: 4 mmol/L (ref 3.5–5.1)
Potassium: 3.9 mmol/L (ref 3.5–5.1)
Sodium: 138 mmol/L (ref 135–145)
Sodium: 137 mmol/L (ref 135–145)

## 2023-05-03 LAB — HEPATIC FUNCTION PANEL
ALT: 614 U/L — ABNORMAL HIGH (ref 0–44)
AST: 1459 U/L — ABNORMAL HIGH (ref 15–41)
Albumin: 2.2 g/dL — ABNORMAL LOW (ref 3.5–5.0)
Alkaline Phosphatase: 401 U/L — ABNORMAL HIGH (ref 38–126)
Bilirubin, Direct: 1.9 mg/dL — ABNORMAL HIGH (ref 0.0–0.2)
Indirect Bilirubin: 1.5 mg/dL — ABNORMAL HIGH (ref 0.3–0.9)
Total Bilirubin: 3.4 mg/dL — ABNORMAL HIGH (ref 0.3–1.2)
Total Protein: 5.8 g/dL — ABNORMAL LOW (ref 6.5–8.1)

## 2023-05-03 LAB — LACTIC ACID, PLASMA: Lactic Acid, Venous: 3 mmol/L (ref 0.5–1.9)

## 2023-05-03 LAB — PROTIME-INR
INR: 3.1 — ABNORMAL HIGH (ref 0.8–1.2)
Prothrombin Time: 31.9 s — ABNORMAL HIGH (ref 11.4–15.2)

## 2023-05-03 LAB — MAGNESIUM: Magnesium: 2.5 mg/dL — ABNORMAL HIGH (ref 1.7–2.4)

## 2023-05-03 LAB — PREPARE FRESH FROZEN PLASMA

## 2023-05-03 LAB — CBC
HCT: 30.7 % — ABNORMAL LOW (ref 39.0–52.0)
Hemoglobin: 10.5 g/dL — ABNORMAL LOW (ref 13.0–17.0)
MCH: 32.9 pg (ref 26.0–34.0)
MCHC: 34.2 g/dL (ref 30.0–36.0)
MCV: 96.2 fL (ref 80.0–100.0)
Platelets: 72 10*3/uL — ABNORMAL LOW (ref 150–400)
RBC: 3.19 MIL/uL — ABNORMAL LOW (ref 4.22–5.81)
RDW: 18 % — ABNORMAL HIGH (ref 11.5–15.5)
WBC: 9 10*3/uL (ref 4.0–10.5)
nRBC: 11 % — ABNORMAL HIGH (ref 0.0–0.2)

## 2023-05-03 LAB — GLUCOSE, CAPILLARY
Glucose-Capillary: 121 mg/dL — ABNORMAL HIGH (ref 70–99)
Glucose-Capillary: 103 mg/dL — ABNORMAL HIGH (ref 70–99)
Glucose-Capillary: 122 mg/dL — ABNORMAL HIGH (ref 70–99)
Glucose-Capillary: 114 mg/dL — ABNORMAL HIGH (ref 70–99)
Glucose-Capillary: 106 mg/dL — ABNORMAL HIGH (ref 70–99)

## 2023-05-03 LAB — BPAM FFP
Blood Product Expiration Date: 202409152359
ISSUE DATE / TIME: 202409101840
Unit Type and Rh: 5100

## 2023-05-03 LAB — PROCALCITONIN: Procalcitonin: 31.61 ng/mL

## 2023-05-03 LAB — AMMONIA: Ammonia: 30 umol/L (ref 9–35)

## 2023-05-03 MED ORDER — RENA-VITE PO TABS: 1.0000 | ORAL_TABLET | Freq: Every day | ORAL | Status: DC

## 2023-05-03 MED ORDER — SODIUM CHLORIDE 0.9 % IV SOLN
2.0000 g | INTRAVENOUS | Status: DC
  Filled 2023-05-03: qty 20

## 2023-05-03 MED ORDER — TRANEXAMIC ACID 1000 MG/10ML IV SOLN
500.0000 mg | INTRAVENOUS | Status: DC
  Administered 2023-05-03: 500 mg via TOPICAL
  Filled 2023-05-03: qty 10

## 2023-05-03 MED ORDER — SODIUM CHLORIDE 0.9% IV SOLUTION: Freq: Once | INTRAVENOUS | Status: AC

## 2023-05-03 MED ORDER — THROMBIN 20000 UNITS EX KIT
20000.0000 [IU] | PACK | Freq: Once | CUTANEOUS | Status: AC
  Administered 2023-05-03: 20000 [IU] via TOPICAL
  Filled 2023-05-03: qty 1

## 2023-05-03 MED ORDER — VITAMIN K1 10 MG/ML IJ SOLN
10.0000 mg | Freq: Once | INTRAVENOUS | Status: AC
  Administered 2023-05-03: 10 mg via INTRAVENOUS
  Filled 2023-05-03: qty 1

## 2023-05-03 MED ORDER — SODIUM CHLORIDE 0.9 % IV SOLN
Freq: Two times a day (BID) | INTRAVENOUS | Status: DC
  Filled 2023-05-03 (×5): qty 50

## 2023-05-03 NOTE — Progress Notes (Signed)
NAME:  Micheal Aguirre, MRN:  [id number], DOB:  Feb 28, 1956, LOS: 2 ADMISSION DATE:  05/13/2023, CONSULTATION DATE:  05-13-2023 REFERRING MD:  Rochele Raring,   CHIEF COMPLAINT:  Altered Mental Status    HPI:  67 y.o. 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  On review of chart patient was recently admitted from 04/17/2023 to 04/25/2023 with cellulitis of right leg for which she completed 8 days of IV antibiotics and was discharged on Keflex for additional 5 days.    ED Course: Initial vital signs showed HR of beats/minute, BP 117/81 mm Hg, the RR 17 breaths/minute, and the oxygen saturation 98% on and a temperature of 95.59F (35.2C).  Na+/ K+: 130/7.4 Glucose: 182 BUN/Cr.:75/6.68 Calcium: 7.1 AST/ALT:808/403, CO2 18, Anion Gap 22 WBC: 9.4 Hgb/Hct: 11.7/36.0 Plts: 77 PCT:79.84 Lactic acid: 4.8 COVID PCR: Negative,  VBG: pO2 34; pCO2 35; pH 7.31;  HCO3 17.6, %O2 Sat 52.3.  CXR> CTH> CTA Chest> CT Abd/pelvis>see below  Patient given 30 cc/kg of fluids and started on broad-spectrum antibiotics Vanco cefepime and Flagyl for sepsis with septic shock. Patient remained hypotensive despite IVF boluses therefore was started on Levophed.  (Sepsis reassessment completed). PCCM consulted.  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, metastatic prostate cancer on indefinite ADT, CAD s/p CABG, ischemic cardiomyopathy, atrial fibrillation on Eliquis, type 2 diabetes, hypertension, chronic renal disease, duodenal ulcers, cognitive deficits, history of alcohol use disorder  Significant Hospital Events   9/9:Admit with metabolic  encephalopathy in the setting of severe sepsis/uremia 05/13/23 family discussion LIMITED CODE 05/13/2023 CT scans of RT leg c/w cellulitis 9/10 remains metabolic encephalopathy in the setting of severe sepsis/uremia LIVER FAILURE 9/11 remains metabolic encephalopathy in the setting of severe sepsis/uremia LIVER FAILURE  Consults:  Nephrology  Procedures:  None  Significant Diagnostic Tests:  05/13/23: Chest Xray>IMPRESSION: Limited study due to expiratory technique. The visualized lungs clear but the lung bases are not seen due to expiration. Stable cardiomegaly and aortic atherosclerosis.  May 13, 2023: Noncontrast CT head>IMPRESSION: 1. Motion degraded exam. 2. 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 3. No other acute intracranial abnormality.  IMPRESSION: 1. Progressed hepatomegaly and liver extensive infiltration by indistinct hypodense masses since CT last month. Unclear whether this is Primary Versus Metastatic Liver Tumor. But there is associated Pathologic Retroperitoneal and Pelvic/inguinal Lymphadenopathy. Plus diffusely heterogeneous bone mineralization raising the possibility of skeletal metastases.   2. But no superimposed new/acute or inflammatory process is identified in the noncontrast chest, abdomen, or pelvis.   3.  Aortic Atherosclerosis (ICD10-I70.0). 13-May-2023: CT Chest, abdomen and pelvis>  Interim History / Subjective:    Remains encephalopathic Remains on pressors Remains on CRRT +liver failure Bleeding from CVL plt given High risk for bleeding Moans and groans in pain Patient is in the dying process  Micro Data:  05-13-23: SARS-CoV-2 PCR> negative 2023/05/13: Influenza PCR> negative May 13, 2023: Blood culture x2> May 13, 2023: MRSA PCR>>   Antimicrobials:   Antibiotics Given (last 72 hours)     Date/Time Action Medication Dose Rate   2023-05-13 0423 New Bag/Given   ceFEPIme (MAXIPIME) 2 g  in sodium  chloride 0.9 % 100 mL IVPB 2 g 200 mL/hr   05/15/2023 0515 New Bag/Given   metroNIDAZOLE (FLAGYL) IVPB 500 mg 500 mg 100 mL/hr   05/02/2023 1027 New Bag/Given   vancomycin (VANCOREADY) IVPB 2000 mg/400 mL 2,000 mg 200 mL/hr   05/05/2023 1612 New Bag/Given   piperacillin-tazobactam (ZOSYN) IVPB 3.375 g 3.375 g 12.5 mL/hr   05/02/23 0038 New Bag/Given   piperacillin-tazobactam (ZOSYN) IVPB 3.375 g 3.375 g 12.5 mL/hr   05/02/23 0748 New Bag/Given   piperacillin-tazobactam (ZOSYN) IVPB 3.375 g 3.375 g 12.5 mL/hr   05/02/23 0957 New Bag/Given   vancomycin (VANCOCIN) IVPB 1000 mg/200 mL premix 1,000 mg 200 mL/hr   05/02/23 1616 New Bag/Given   piperacillin-tazobactam (ZOSYN) IVPB 3.375 g 3.375 g 12.5 mL/hr   05/03/23 0000 New Bag/Given   piperacillin-tazobactam (ZOSYN) IVPB 3.375 g 3.375 g 12.5 mL/hr        OBJECTIVE  Blood pressure (!) 89/63, pulse 74, temperature 98.3 F (36.8 C), temperature source Axillary, resp. rate 13, height 5\' 9"  (1.753 m), weight 93.7 kg, SpO2 96%.        Intake/Output Summary (Last 24 hours) at 05/03/2023 0708 Last data filed at 05/03/2023 0700 Gross per 24 hour  Intake 1591.31 ml  Output 1653 ml  Net -61.69 ml   Filed Weights   04/24/2023 0313 05/02/23 0500 05/03/23 0500  Weight: 94.1 kg 79.8 kg 93.7 kg     REVIEW OF SYSTEMS  PATIENT IS UNABLE TO PROVIDE COMPLETE REVIEW OF SYSTEMS DUE TO SEVERE CRITICAL ILLNESS   PHYSICAL EXAMINATION:  GENERAL:critically ill appearing, +resp distress EYES: Pupils equal, round, reactive to light.  No scleral icterus.  MOUTH: Moist mucosal membrane. INTUBATED NECK: Supple.  PULMONARY: Lungs clear to auscultation, +rhonchi, +wheezing CARDIOVASCULAR: S1 and S2.  Regular rate and rhythm GASTROINTESTINAL: Soft, nontender, -distended. Positive bowel sounds.  NEUROLOGIC: confused, encephalopathic EXTREMITIES:Right lower extremity: Significant uniform swelling and exquisite tenderness to palpation. Subtle erythema noted  particularly over the medial inferior aspect where is skin breakdown with serous drainage. Erythema extends upwards. Increased warmth to touch  Capillary refill < 3 seconds in all extremities. Pulses palpable distally.  SKIN:see below    Labs/imaging that I havepersonally reviewed  (right click and "Reselect all SmartList Selections" daily)     Labs   CBC: Recent Labs  Lab 04/29/2023 0421 05/15/2023 2250 05/02/23 0404 05/02/23 1318 05/02/23 2109 05/03/23 0545  WBC 9.4 9.5 8.6 8.0 8.3 9.0  NEUTROABS 8.8*  --   --  7.7  --   --   HGB 11.7* 11.4* 10.9* 10.6* 10.6* 10.5*  HCT 36.0* 33.2* 33.2* 32.0* 31.3* 30.7*  MCV 101.7* 97.1 98.8 97.6 95.7 96.2  PLT 77* 71* 63* 53* 53* 72*    Basic Metabolic Panel: Recent Labs  Lab 05/14/2023 1102 04/26/2023 1547 05/02/23 0404 05/02/23 1603 05/03/23 0545  NA 132* 133* 134* 135 138  K 5.3* 5.1 4.0 3.9 4.0  CL 91* 92* 96* 99 102  CO2 21* 21* 23 22 22   GLUCOSE 169* 165* 152* 146* 130*  BUN 70* 65* 42* 30* 20  CREATININE 6.31* 5.59* 3.35* 2.20* 1.44*  CALCIUM 7.1* 7.5* 8.4* 8.6* 8.4*  MG  --   --  2.2  --  2.5*  PHOS  --  5.9* 4.3 3.5 2.9   GFR: Estimated Creatinine Clearance: 56.3 mL/min (A) (by C-G formula based on SCr of 1.44 mg/dL (H)). Recent Labs  Lab 05/08/2023 0414 05/18/2023 0421 05/16/2023 1254 05/07/2023 2250  05/02/23 0404 05/02/23 1318 05/02/23 1602 05/02/23 2109 05/03/23 0545  PROCALCITON 79.84  --   --   --   --   --  48.67  --   --   WBC  --    < >  --    < > 8.6 8.0  --  8.3 9.0  LATICACIDVEN  --    < > 2.2*  --  2.3* 2.8*  --   --  3.0*   < > = values in this interval not displayed.    Liver Function Tests: Recent Labs  Lab 04/30/2023 0414 04/29/2023 1547 05/02/23 0404 05/02/23 1603 05/03/23 0545  AST 1,892*  --  2,233*  --   --   ALT 808*  --  877*  --   --   ALKPHOS 403*  --  412*  --   --   BILITOT 3.0*  --  3.1*  --   --   PROT 5.7*  --  5.8*  --   --   ALBUMIN 2.3* 1.9* 2.2*  2.2* 2.2* 2.2*   No results  for input(s): "LIPASE", "AMYLASE" in the last 168 hours. No results for input(s): "AMMONIA" in the last 168 hours.  ABG    Component Value Date/Time   HCO3 17.6 (L) 05/20/2023 0325   ACIDBASEDEF 7.8 (H) 05/11/2023 0325   O2SAT 52.3 05/18/2023 0325     Coagulation Profile: Recent Labs  Lab 05/10/2023 0331 05/02/23 0404 05/03/23 0545  INR 3.7* 3.3* 3.1*    Cardiac Enzymes: No results for input(s): "CKTOTAL", "CKMB", "CKMBINDEX", "TROPONINI" in the last 168 hours.  HbA1C: Hgb A1c MFr Bld  Date/Time Value Ref Range Status  05/13/2023 04:21 AM 6.1 (H) 4.8 - 5.6 % Final    Comment:    (NOTE) Pre diabetes:          5.7%-6.4%  Diabetes:              >6.4%  Glycemic control for   <7.0% adults with diabetes   11/27/2021 05:59 AM 5.6 4.8 - 5.6 % Final    Comment:    (NOTE) Pre diabetes:          5.7%-6.4%  Diabetes:              >6.4%  Glycemic control for   <7.0% adults with diabetes     CBG: Recent Labs  Lab 05/02/23 1116 05/02/23 1536 05/02/23 2002 05/02/23 2357 05/03/23 0445  GLUCAP 157* 125* 142* 117* 121*   Assessment & Plan:  Admitted for  septic shock due to  Right Leg Cellulitis with acute metabolic encephalopathy with acute liver failure in the setting metastatic cancer  RESP DISTRESS HIGH RISK FOR CARDIAC ARREST AND INTUBATION HIGH CHANCES OF DEATH Oxygen as needed   SEPTIC shock SOURCE-RT LEG, liver mets -use vasopressors to keep MAP>65 as needed -follow ABG and LA as needed -follow up cultures -emperic ABX -stress dose steroids    NEUROLOGY -CTH neg -Likely multifactorial in the setting of multiple medical comorbidities including sepsis. -Treat medical conditions as above -Avoid sedatives as able Sedation as needed    ESRD ON CRRT -continue Foley Catheter-assess need -Avoid nephrotoxic agents -Follow urine output, BMP -Ensure adequate renal perfusion, optimize oxygenation -Renal dose medications   Intake/Output Summary (Last  24 hours) at 05/03/2023 0708 Last data filed at 05/03/2023 0700 Gross per 24 hour  Intake 1591.31 ml  Output 1653 ml  Net -61.69 ml    CARDIAC Chronic Combined Systolic and  Diastolic CHF Ischemic Cardiomyopathy s/p CABG x 3 most recent  04/04/2023 Can NOT start AC due to high risk of bleeding     ACUTE LIVER FAILURE Likely multifactorial in the setting of  liver mets, hepatorenal and severe sepsis as above -CT multiple lesions throughout the liver concerning for mets -Continue to trend -Avoid hepatotoxic This is terminal condition  Hx of prostate cancer with mets to lymph nodes and bones -Oncology following   ENDO - ICU hypoglycemic\Hyperglycemia protocol -check FSBS per protocol   GI GI PROPHYLAXIS as indicated  NUTRITIONAL STATUS DIET-->NPO Constipation protocol as indicated   ELECTROLYTES -follow labs as needed -replace as needed -pharmacy consultation and following   Best practice:  Diet:  NPO Pain/Anxiety/Delirium protocol (if indicated): No VAP protocol (if indicated): Not indicated DVT prophylaxis: Subcutaneous Heparin GI prophylaxis: N/A Glucose control:  SSI Yes Central venous access:  N/A Arterial line:  N/A Foley:  N/A Mobility:  bed rest  PT consulted: N/A Last date of multidisciplinary goals of care discussion [no family at bedside] Code Status:  LIMITED CODE Disposition: ICU     DVT/GI PRX  assessed I Assessed the need for Labs I Assessed the need for Foley I Assessed the need for Central Venous Line Family Discussion when available I Assessed the need for Mobilization I made an Assessment of medications to be adjusted accordingly Safety Risk assessment completed  CASE DISCUSSED IN MULTIDISCIPLINARY ROUNDS WITH ICU TEAM     Critical Care Time devoted to patient care services described in this note is 55 minutes.  Critical care was necessary to treat /prevent imminent and life-threatening deterioration. Overall, patient is  critically ill, prognosis is guarded.  Patient with Multiorgan failure and at high risk for cardiac arrest and death.    Lucie Leather, M.D.  [facility]  Medical Director [facility] Medical Director [facility] Cardio-Pulmonary Department

## 2023-05-03 NOTE — IPAL (Signed)
  Interdisciplinary Goals of Care Family Meeting   Date carried out: 05/03/2023  Location of the meeting: Conference room  Member's involved: Physician and Family Member or next of kin  Durable Power of Attorney or acting medical decision maker: Daughter Alex    GOALS OF CARE DISCUSSION  The Clinical status was relayed to family in detail-  Updated and notified of patients medical condition- Patient remains unresponsive and will not open eyes to command.   Patient is having a weak cough and struggling to remove secretions.   Patient with increased WOB and using accessory muscles to breathe Explained to family course of therapy and the modalities   Patient with Progressive multiorgan failure with a very high probablity of a very minimal chance of meaningful recovery despite all aggressive and optimal medical therapy.  PATIENT REMAINS LIMITED CODE  Family understands the situation. Progressive Liver failure and damage due to cancer Leading to multiorgan failure   Family are satisfied with Plan of action and management. All questions answered  Additional CC time 45 mins   Deepa Barthel Santiago Glad, M.D.  [facility]  Medical Director [facility] Medical Director [facility] Cardio-Pulmonary Department

## 2023-05-03 NOTE — Progress Notes (Signed)
Daily Progress Note   Patient Name: Micheal Aguirre       Date: 05/03/2023 DOB: [birth year]  Age: 67 y.o. MRN#: [id number] Attending Physician: Erin Fulling, MD Primary Care Physician: Almetta Lovely, [facility] Admit Date: 02-May-2023  Reason for Consultation/Follow-up: Establishing goals of care  Subjective: Notes and labs reviewed.  In to see patient.  His current resting in bed with eyes closed; CRRT in progress.  No family at bedside.  Discussed case and updates with CCM.  PMT will continue to follow along and support family as able.    Length of Stay: 2  Current Medications: Scheduled Meds:   vitamin C  500 mg Oral BID   Chlorhexidine Gluconate Cloth  6 each Topical Daily   feeding supplement (NEPRO CARB STEADY)  237 mL Oral TID BM   hydrocortisone sod succinate (SOLU-CORTEF) inj  50 mg Intravenous Q8H   insulin aspart  0-6 Units Subcutaneous Q4H   multivitamin  1 tablet Per Tube QHS   pantoprazole (PROTONIX) IV  40 mg Intravenous Q12H   tranexamic acid  500 mg Topical UD    Continuous Infusions:   prismasol BGK 4/2.5 400 mL/hr at 05/03/23 1224    prismasol BGK 4/2.5 400 mL/hr at 05/03/23 1224   sodium chloride 10 mL/hr at 05/03/23 1400   [START ON 05/15/2023] cefTRIAXone (ROCEPHIN)  IV     norepinephrine (LEVOPHED) Adult infusion 10 mcg/min (05/03/23 1400)   piperacillin-tazobactam (ZOSYN)  IV Stopped (05/03/23 1206)   prismasol BGK 4/2.5 2,000 mL/hr at 05/03/23 1225   sodium chloride 0.9 % 50 mL with ascorbic acid (VITAMIN C) 500 mg infusion     vancomycin Stopped (05/02/23 1058)   vasopressin 0.03 Units/min (05/03/23 1400)    PRN Meds: docusate sodium, fentaNYL (SUBLIMAZE) injection, midazolam, mouth rinse, polyethylene glycol  Physical Exam Constitutional:      Comments:  Eyes closed.    Pulmonary:     Effort: Pulmonary effort is normal.             Vital Signs: BP 100/70   Pulse 86   Temp 98.1 F (36.7 C) (Axillary)   Resp 18   Ht 5\' 9"  (1.753 m)   Wt 93.7 kg   SpO2 94%   BMI 30.51 kg/m  SpO2: SpO2: 94 % O2 Device: O2 Device: Room ONEOK  O2 Flow Rate: O2 Flow Rate (L/min): 1 L/min  Intake/output summary:  Intake/Output Summary (Last 24 hours) at 05/03/2023 1419 Last data filed at 05/03/2023 1400 Gross per 24 hour  Intake 1642.13 ml  Output 1749 ml  Net -106.87 ml   LBM: Last BM Date : 05/03/23 Baseline Weight: Weight: 94.1 kg Most recent weight: Weight: 93.7 kg   Patient Active Problem List   Diagnosis Date Noted   Severe sepsis with acute organ dysfunction (HCC) 05/08/2023   Cellulitis of right leg 04/17/2023   Elevated LFTs 04/17/2023   Type II diabetes mellitus with renal manifestations (HCC) 02/03/2022   HTN (hypertension) 02/03/2022   HLD (hyperlipidemia) 02/03/2022   Atrial fibrillation, chronic (HCC) 02/03/2022   Fall at home, initial encounter 02/03/2022   Hypokalemia 02/03/2022   Closed fracture of right distal femur (HCC) 02/03/2022   Chronic diastolic CHF (congestive heart failure) (HCC) 11/30/2021   DMII (diabetes mellitus, type 2) (HCC) 11/30/2021   Prostate cancer (HCC) 11/30/2021   Left renal mass 11/30/2021   ESRD on dialysis [facility])    Primary hypertension    Closed fracture of lateral portion of right tibial plateau 11/26/2021    Palliative Care Assessment & Plan    Recommendations/Plan: PMT will follow  Code Status:    Code Status Orders  (From admission, onward)           Start     Ordered   05/22/2023 1719  Do not attempt resuscitation (DNR) Pre-Arrest Interventions Desired  (Code Status)  Continuous       Question Answer Comment  If pulseless and not breathing No CPR or chest compressions.   In Pre-Arrest Conditions (Patient Has Pulse and Is Breathing) May intubate, use advanced airway  interventions and cardioversion/ACLS medications if appropriate or indicated. May transfer to ICU.   Consent: Discussion documented in EHR or advanced directives reviewed      05/10/2023 1718           Code Status History     Date Active Date Inactive Code Status Order ID Comments User Context   05/19/2023 0614 04/25/2023 1718 Full Code [id number]  Jimmye Norman, NP ED   04/17/2023 1347 04/25/2023 1855 Full Code [id number]  Verdene Lennert, MD ED   02/03/2022 1259 02/07/2022 2049 Full Code [id number]  Lorretta Harp, MD ED   11/26/2021 1932 12/02/2021 2200 Full Code [id number]  Darlin Drop, DO ED      Advance Directive Documentation    Flowsheet Row Most Recent Value  Type of Advance Directive Healthcare Power of Attorney  Pre-existing out of facility DNR order (yellow form or pink MOST form) --  "MOST" Form in Place? --       Prognosis: Poor   Care plan was discussed with CCM and RN  Thank you for allowing the Palliative Medicine Team to assist in the care of this patient.    Morton Stall, NP  Please contact Palliative Medicine Team phone at [phone] for questions and concerns.

## 2023-05-03 NOTE — Consult Note (Signed)
joint with joint space narrowing and osteophytes. No significant joint effusions are identified. There is no evidence of osteomyelitis within the lower leg or foot. Right thigh findings are dictated separately. Ligaments Suboptimally assessed by CT. Muscles and Tendons Generalized muscular atrophy throughout the lower leg and foot. As evaluated by CT, the quadriceps and patellar tendons are intact. The ankle tendons appear intact without significant tenosynovitis. Soft tissues There is subcutaneous edema within the lower leg, greatest distally. Foot edema is greatest dorsally. No focal fluid collection, soft tissue emphysema or unexpected foreign bodies are identified in the lower leg or foot. IMPRESSION: 1. Nonspecific subcutaneous edema within the lower leg and foot, greatest dorsally. No focal fluid collection, soft tissue emphysema or unexpected foreign bodies identified. 2. No evidence of osteomyelitis. 3. Posttraumatic deformity of the lateral tibial plateau status post ORIF. 4. Generalized muscular atrophy. 5. Advanced degenerative changes at the 1st metatarsophalangeal joint. Electronically Signed   By: Carey Bullocks M.D.   On: 05/06/2023 16:52   CT FEMUR RIGHT WO CONTRAST  Result Date: 05/08/2023 CLINICAL DATA:  Soft tissue infection suspected. History of right femur fracture with ORIF 02/03/2022. EXAM: CT OF THE LOWER RIGHT EXTREMITY WITHOUT CONTRAST TECHNIQUE: Multidetector CT imaging of the right thigh was performed according to the standard protocol. RADIATION DOSE REDUCTION: This exam was performed according to the departmental dose-optimization program which includes automated exposure control, adjustment of the mA and/or kV according to patient size and/or use of iterative reconstruction technique. COMPARISON:  Right femur radiographs 02/03/2022. Pelvic CT 04/11/2023. FINDINGS: Bones/Joint/Cartilage Status post  right femoral intramedullary nail fixation of a comminuted fracture involving the distal femoral diaphysis. The hardware is intact without loosening. The distal femur fracture appears healed with bridging bone and mild medial displacement at the fracture by 8 mm. No evidence of acute fracture or dislocation. No evidence of osteomyelitis. Lower leg findings are dictated separately. There is no significant knee joint effusion. Mild right hip degenerative changes without significant hip joint effusion. Ligaments Suboptimally assessed by CT. Muscles and Tendons Generalized muscular atrophy noted. No intramuscular fluid collection identified. The quadriceps and patellar tendons are intact. Soft tissues Mild skin thickening and subcutaneous edema throughout the right thigh. The skin thickening appears greatest laterally in the proximal thigh and posteromedially in the distal thigh. Peripherally dense soft tissue nodule superiorly in the popliteal fossa measures 2.5 x 1.7 cm on image 716/5, possibly a lymph node. Prominent external iliac and right inguinal lymph nodes are also noted, similar to recent pelvic CT. No focal fluid collections identified. There are diffuse vascular calcifications. IMPRESSION: 1. Nonspecific skin thickening and subcutaneous edema throughout the right thigh, most consistent with cellulitis. No focal fluid collections identified. 2. No evidence of osteomyelitis or septic arthritis. 3. Previous ORIF of a comminuted distal femoral fracture. The fracture appears healed with bridging bone. 4. Prominent pelvic and right inguinal lymph nodes. Possible lymph node in the superior aspect of the popliteal fossa. These lymph nodes are nonspecific and possibly reactive. 5. Lower leg findings are dictated separately. Electronically Signed   By: Carey Bullocks M.D.   On: 04/30/2023 16:40   CT CHEST ABDOMEN PELVIS WO CONTRAST  Result Date: 05/02/2023 CLINICAL DATA:  67 year old male with altered mental  status, no speech. Possible sepsis. Recently diagnosed with liver masses. EXAM: CT CHEST, ABDOMEN AND PELVIS WITHOUT CONTRAST TECHNIQUE: Multidetector CT imaging of the chest, abdomen and pelvis was performed following the standard protocol without IV contrast. RADIATION DOSE REDUCTION: This exam was performed  joint with joint space narrowing and osteophytes. No significant joint effusions are identified. There is no evidence of osteomyelitis within the lower leg or foot. Right thigh findings are dictated separately. Ligaments Suboptimally assessed by CT. Muscles and Tendons Generalized muscular atrophy throughout the lower leg and foot. As evaluated by CT, the quadriceps and patellar tendons are intact. The ankle tendons appear intact without significant tenosynovitis. Soft tissues There is subcutaneous edema within the lower leg, greatest distally. Foot edema is greatest dorsally. No focal fluid collection, soft tissue emphysema or unexpected foreign bodies are identified in the lower leg or foot. IMPRESSION: 1. Nonspecific subcutaneous edema within the lower leg and foot, greatest dorsally. No focal fluid collection, soft tissue emphysema or unexpected foreign bodies identified. 2. No evidence of osteomyelitis. 3. Posttraumatic deformity of the lateral tibial plateau status post ORIF. 4. Generalized muscular atrophy. 5. Advanced degenerative changes at the 1st metatarsophalangeal joint. Electronically Signed   By: Carey Bullocks M.D.   On: 05/06/2023 16:52   CT FEMUR RIGHT WO CONTRAST  Result Date: 05/08/2023 CLINICAL DATA:  Soft tissue infection suspected. History of right femur fracture with ORIF 02/03/2022. EXAM: CT OF THE LOWER RIGHT EXTREMITY WITHOUT CONTRAST TECHNIQUE: Multidetector CT imaging of the right thigh was performed according to the standard protocol. RADIATION DOSE REDUCTION: This exam was performed according to the departmental dose-optimization program which includes automated exposure control, adjustment of the mA and/or kV according to patient size and/or use of iterative reconstruction technique. COMPARISON:  Right femur radiographs 02/03/2022. Pelvic CT 04/11/2023. FINDINGS: Bones/Joint/Cartilage Status post  right femoral intramedullary nail fixation of a comminuted fracture involving the distal femoral diaphysis. The hardware is intact without loosening. The distal femur fracture appears healed with bridging bone and mild medial displacement at the fracture by 8 mm. No evidence of acute fracture or dislocation. No evidence of osteomyelitis. Lower leg findings are dictated separately. There is no significant knee joint effusion. Mild right hip degenerative changes without significant hip joint effusion. Ligaments Suboptimally assessed by CT. Muscles and Tendons Generalized muscular atrophy noted. No intramuscular fluid collection identified. The quadriceps and patellar tendons are intact. Soft tissues Mild skin thickening and subcutaneous edema throughout the right thigh. The skin thickening appears greatest laterally in the proximal thigh and posteromedially in the distal thigh. Peripherally dense soft tissue nodule superiorly in the popliteal fossa measures 2.5 x 1.7 cm on image 716/5, possibly a lymph node. Prominent external iliac and right inguinal lymph nodes are also noted, similar to recent pelvic CT. No focal fluid collections identified. There are diffuse vascular calcifications. IMPRESSION: 1. Nonspecific skin thickening and subcutaneous edema throughout the right thigh, most consistent with cellulitis. No focal fluid collections identified. 2. No evidence of osteomyelitis or septic arthritis. 3. Previous ORIF of a comminuted distal femoral fracture. The fracture appears healed with bridging bone. 4. Prominent pelvic and right inguinal lymph nodes. Possible lymph node in the superior aspect of the popliteal fossa. These lymph nodes are nonspecific and possibly reactive. 5. Lower leg findings are dictated separately. Electronically Signed   By: Carey Bullocks M.D.   On: 04/30/2023 16:40   CT CHEST ABDOMEN PELVIS WO CONTRAST  Result Date: 05/02/2023 CLINICAL DATA:  67 year old male with altered mental  status, no speech. Possible sepsis. Recently diagnosed with liver masses. EXAM: CT CHEST, ABDOMEN AND PELVIS WITHOUT CONTRAST TECHNIQUE: Multidetector CT imaging of the chest, abdomen and pelvis was performed following the standard protocol without IV contrast. RADIATION DOSE REDUCTION: This exam was performed  joint with joint space narrowing and osteophytes. No significant joint effusions are identified. There is no evidence of osteomyelitis within the lower leg or foot. Right thigh findings are dictated separately. Ligaments Suboptimally assessed by CT. Muscles and Tendons Generalized muscular atrophy throughout the lower leg and foot. As evaluated by CT, the quadriceps and patellar tendons are intact. The ankle tendons appear intact without significant tenosynovitis. Soft tissues There is subcutaneous edema within the lower leg, greatest distally. Foot edema is greatest dorsally. No focal fluid collection, soft tissue emphysema or unexpected foreign bodies are identified in the lower leg or foot. IMPRESSION: 1. Nonspecific subcutaneous edema within the lower leg and foot, greatest dorsally. No focal fluid collection, soft tissue emphysema or unexpected foreign bodies identified. 2. No evidence of osteomyelitis. 3. Posttraumatic deformity of the lateral tibial plateau status post ORIF. 4. Generalized muscular atrophy. 5. Advanced degenerative changes at the 1st metatarsophalangeal joint. Electronically Signed   By: Carey Bullocks M.D.   On: 05/06/2023 16:52   CT FEMUR RIGHT WO CONTRAST  Result Date: 05/08/2023 CLINICAL DATA:  Soft tissue infection suspected. History of right femur fracture with ORIF 02/03/2022. EXAM: CT OF THE LOWER RIGHT EXTREMITY WITHOUT CONTRAST TECHNIQUE: Multidetector CT imaging of the right thigh was performed according to the standard protocol. RADIATION DOSE REDUCTION: This exam was performed according to the departmental dose-optimization program which includes automated exposure control, adjustment of the mA and/or kV according to patient size and/or use of iterative reconstruction technique. COMPARISON:  Right femur radiographs 02/03/2022. Pelvic CT 04/11/2023. FINDINGS: Bones/Joint/Cartilage Status post  right femoral intramedullary nail fixation of a comminuted fracture involving the distal femoral diaphysis. The hardware is intact without loosening. The distal femur fracture appears healed with bridging bone and mild medial displacement at the fracture by 8 mm. No evidence of acute fracture or dislocation. No evidence of osteomyelitis. Lower leg findings are dictated separately. There is no significant knee joint effusion. Mild right hip degenerative changes without significant hip joint effusion. Ligaments Suboptimally assessed by CT. Muscles and Tendons Generalized muscular atrophy noted. No intramuscular fluid collection identified. The quadriceps and patellar tendons are intact. Soft tissues Mild skin thickening and subcutaneous edema throughout the right thigh. The skin thickening appears greatest laterally in the proximal thigh and posteromedially in the distal thigh. Peripherally dense soft tissue nodule superiorly in the popliteal fossa measures 2.5 x 1.7 cm on image 716/5, possibly a lymph node. Prominent external iliac and right inguinal lymph nodes are also noted, similar to recent pelvic CT. No focal fluid collections identified. There are diffuse vascular calcifications. IMPRESSION: 1. Nonspecific skin thickening and subcutaneous edema throughout the right thigh, most consistent with cellulitis. No focal fluid collections identified. 2. No evidence of osteomyelitis or septic arthritis. 3. Previous ORIF of a comminuted distal femoral fracture. The fracture appears healed with bridging bone. 4. Prominent pelvic and right inguinal lymph nodes. Possible lymph node in the superior aspect of the popliteal fossa. These lymph nodes are nonspecific and possibly reactive. 5. Lower leg findings are dictated separately. Electronically Signed   By: Carey Bullocks M.D.   On: 04/30/2023 16:40   CT CHEST ABDOMEN PELVIS WO CONTRAST  Result Date: 05/02/2023 CLINICAL DATA:  67 year old male with altered mental  status, no speech. Possible sepsis. Recently diagnosed with liver masses. EXAM: CT CHEST, ABDOMEN AND PELVIS WITHOUT CONTRAST TECHNIQUE: Multidetector CT imaging of the chest, abdomen and pelvis was performed following the standard protocol without IV contrast. RADIATION DOSE REDUCTION: This exam was performed  joint with joint space narrowing and osteophytes. No significant joint effusions are identified. There is no evidence of osteomyelitis within the lower leg or foot. Right thigh findings are dictated separately. Ligaments Suboptimally assessed by CT. Muscles and Tendons Generalized muscular atrophy throughout the lower leg and foot. As evaluated by CT, the quadriceps and patellar tendons are intact. The ankle tendons appear intact without significant tenosynovitis. Soft tissues There is subcutaneous edema within the lower leg, greatest distally. Foot edema is greatest dorsally. No focal fluid collection, soft tissue emphysema or unexpected foreign bodies are identified in the lower leg or foot. IMPRESSION: 1. Nonspecific subcutaneous edema within the lower leg and foot, greatest dorsally. No focal fluid collection, soft tissue emphysema or unexpected foreign bodies identified. 2. No evidence of osteomyelitis. 3. Posttraumatic deformity of the lateral tibial plateau status post ORIF. 4. Generalized muscular atrophy. 5. Advanced degenerative changes at the 1st metatarsophalangeal joint. Electronically Signed   By: Carey Bullocks M.D.   On: 05/06/2023 16:52   CT FEMUR RIGHT WO CONTRAST  Result Date: 05/08/2023 CLINICAL DATA:  Soft tissue infection suspected. History of right femur fracture with ORIF 02/03/2022. EXAM: CT OF THE LOWER RIGHT EXTREMITY WITHOUT CONTRAST TECHNIQUE: Multidetector CT imaging of the right thigh was performed according to the standard protocol. RADIATION DOSE REDUCTION: This exam was performed according to the departmental dose-optimization program which includes automated exposure control, adjustment of the mA and/or kV according to patient size and/or use of iterative reconstruction technique. COMPARISON:  Right femur radiographs 02/03/2022. Pelvic CT 04/11/2023. FINDINGS: Bones/Joint/Cartilage Status post  right femoral intramedullary nail fixation of a comminuted fracture involving the distal femoral diaphysis. The hardware is intact without loosening. The distal femur fracture appears healed with bridging bone and mild medial displacement at the fracture by 8 mm. No evidence of acute fracture or dislocation. No evidence of osteomyelitis. Lower leg findings are dictated separately. There is no significant knee joint effusion. Mild right hip degenerative changes without significant hip joint effusion. Ligaments Suboptimally assessed by CT. Muscles and Tendons Generalized muscular atrophy noted. No intramuscular fluid collection identified. The quadriceps and patellar tendons are intact. Soft tissues Mild skin thickening and subcutaneous edema throughout the right thigh. The skin thickening appears greatest laterally in the proximal thigh and posteromedially in the distal thigh. Peripherally dense soft tissue nodule superiorly in the popliteal fossa measures 2.5 x 1.7 cm on image 716/5, possibly a lymph node. Prominent external iliac and right inguinal lymph nodes are also noted, similar to recent pelvic CT. No focal fluid collections identified. There are diffuse vascular calcifications. IMPRESSION: 1. Nonspecific skin thickening and subcutaneous edema throughout the right thigh, most consistent with cellulitis. No focal fluid collections identified. 2. No evidence of osteomyelitis or septic arthritis. 3. Previous ORIF of a comminuted distal femoral fracture. The fracture appears healed with bridging bone. 4. Prominent pelvic and right inguinal lymph nodes. Possible lymph node in the superior aspect of the popliteal fossa. These lymph nodes are nonspecific and possibly reactive. 5. Lower leg findings are dictated separately. Electronically Signed   By: Carey Bullocks M.D.   On: 04/30/2023 16:40   CT CHEST ABDOMEN PELVIS WO CONTRAST  Result Date: 05/02/2023 CLINICAL DATA:  67 year old male with altered mental  status, no speech. Possible sepsis. Recently diagnosed with liver masses. EXAM: CT CHEST, ABDOMEN AND PELVIS WITHOUT CONTRAST TECHNIQUE: Multidetector CT imaging of the chest, abdomen and pelvis was performed following the standard protocol without IV contrast. RADIATION DOSE REDUCTION: This exam was performed  exam was performed according to the departmental dose-optimization program which includes automated exposure control, adjustment of the mA and/or kV according to patient size and/or use of iterative reconstruction technique. COMPARISON:  None Available. FINDINGS: Brain: Study is mildly degraded by motion artifact despite repeated imaging attempts. No midline shift, mass effect, or evidence of intracranial mass lesion. No ventriculomegaly. Cerebral volume is probably normal for age. No acute intracranial hemorrhage identified. No cortically based acute infarct identified. There is patchy, moderate bilateral periventricular white matter hypodensity. And there is asymmetric heterogeneity in the thalami, greater on the left (series 2, image 18) compatible with age indeterminate small vessel infarcts. However, on the coronal view the left thalamus hypodensity has a chronic appearance. No superimposed No cortically based acute infarct identified. Vascular: Calcified atherosclerosis at the skull base. No suspicious intracranial vascular hyperdensity. Skull: Pronounced skull base motion artifact. No acute osseous abnormality is evident. Sinuses/Orbits: Motion artifact. No definite sinus or mastoid  opacification. Other: Orbits are obscured by motion. Visualized scalp soft tissues are within normal limits. IMPRESSION: 1. Motion degraded exam. 2. 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 3. No other acute intracranial abnormality. Electronically Signed   By: Odessa Fleming M.D.   On: 04/30/2023 05:20   DG Chest Port 1 View  Result Date: 05/03/2023 CLINICAL DATA:  Questionable sepsis. EXAM: PORTABLE CHEST 1 VIEW COMPARISON:  Portable chest 04/17/2023 FINDINGS: The lungs are expiratory obscuring the lung bases. The visualized lungs are clear. No pleural effusion is seen. 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 Right IJ dialysis catheter again terminates in the right atrium. No new osseous findings. IMPRESSION: Limited study due to expiratory technique. The visualized lungs clear but the lung bases are not seen due to expiration. Stable cardiomegaly and aortic atherosclerosis. Electronically Signed   By: Almira Bar M.D.   On: 05/17/2023 04:12   DG Tibia/Fibula Right  Result Date: 04/17/2023 CLINICAL DATA:  Shortness of breath.  Leg pain EXAM: RIGHT TIBIA AND FIBULA - 2 VIEW COMPARISON:  11/27/2021 FINDINGS: Prior ORIF of the proximal right tibia and distal femur. There is no evidence of acute fracture or other focal bone lesions. Healed fracture deformity of the proximal fibula. Generalized soft tissue swelling. Atherosclerotic vascular calcifications. IMPRESSION: Generalized soft tissue swelling.  No acute findings. Electronically Signed   By: Duanne Guess D.O.   On: 04/17/2023 13:41   DG Chest Portable 1 View  Result Date: 04/17/2023 CLINICAL DATA:  Shortness of breath.  Evaluate for edema. EXAM: PORTABLE CHEST 1 VIEW COMPARISON:  03/20/2023 FINDINGS: There is a right chest wall dialysis catheter with tips in the right atrium. Previous  median sternotomy and CABG procedure. Heart size and mediastinal contours appear normal. Lung volumes are low. Scar versus platelike atelectasis noted in the left base. No pleural effusion, interstitial edema or airspace disease. IMPRESSION: 1. No signs of interstitial edema or pleural effusion. 2. Decreased lung volumes with bibasilar scar versus platelike atelectasis. 3. Low lung volumes. Electronically Signed   By: Signa Kell M.D.   On: 04/17/2023 13:13   US Venous Img Lower Unilateral Right  Result Date: 04/17/2023 CLINICAL DATA:  Right lower extremity edema. EXAM: RIGHT LOWER EXTREMITY VENOUS DOPPLER ULTRASOUND TECHNIQUE: Gray-scale sonography with graded compression, as well as color Doppler and duplex ultrasound were performed to evaluate the lower extremity deep venous systems from the level of the  joint with joint space narrowing and osteophytes. No significant joint effusions are identified. There is no evidence of osteomyelitis within the lower leg or foot. Right thigh findings are dictated separately. Ligaments Suboptimally assessed by CT. Muscles and Tendons Generalized muscular atrophy throughout the lower leg and foot. As evaluated by CT, the quadriceps and patellar tendons are intact. The ankle tendons appear intact without significant tenosynovitis. Soft tissues There is subcutaneous edema within the lower leg, greatest distally. Foot edema is greatest dorsally. No focal fluid collection, soft tissue emphysema or unexpected foreign bodies are identified in the lower leg or foot. IMPRESSION: 1. Nonspecific subcutaneous edema within the lower leg and foot, greatest dorsally. No focal fluid collection, soft tissue emphysema or unexpected foreign bodies identified. 2. No evidence of osteomyelitis. 3. Posttraumatic deformity of the lateral tibial plateau status post ORIF. 4. Generalized muscular atrophy. 5. Advanced degenerative changes at the 1st metatarsophalangeal joint. Electronically Signed   By: Carey Bullocks M.D.   On: 05/06/2023 16:52   CT FEMUR RIGHT WO CONTRAST  Result Date: 05/08/2023 CLINICAL DATA:  Soft tissue infection suspected. History of right femur fracture with ORIF 02/03/2022. EXAM: CT OF THE LOWER RIGHT EXTREMITY WITHOUT CONTRAST TECHNIQUE: Multidetector CT imaging of the right thigh was performed according to the standard protocol. RADIATION DOSE REDUCTION: This exam was performed according to the departmental dose-optimization program which includes automated exposure control, adjustment of the mA and/or kV according to patient size and/or use of iterative reconstruction technique. COMPARISON:  Right femur radiographs 02/03/2022. Pelvic CT 04/11/2023. FINDINGS: Bones/Joint/Cartilage Status post  right femoral intramedullary nail fixation of a comminuted fracture involving the distal femoral diaphysis. The hardware is intact without loosening. The distal femur fracture appears healed with bridging bone and mild medial displacement at the fracture by 8 mm. No evidence of acute fracture or dislocation. No evidence of osteomyelitis. Lower leg findings are dictated separately. There is no significant knee joint effusion. Mild right hip degenerative changes without significant hip joint effusion. Ligaments Suboptimally assessed by CT. Muscles and Tendons Generalized muscular atrophy noted. No intramuscular fluid collection identified. The quadriceps and patellar tendons are intact. Soft tissues Mild skin thickening and subcutaneous edema throughout the right thigh. The skin thickening appears greatest laterally in the proximal thigh and posteromedially in the distal thigh. Peripherally dense soft tissue nodule superiorly in the popliteal fossa measures 2.5 x 1.7 cm on image 716/5, possibly a lymph node. Prominent external iliac and right inguinal lymph nodes are also noted, similar to recent pelvic CT. No focal fluid collections identified. There are diffuse vascular calcifications. IMPRESSION: 1. Nonspecific skin thickening and subcutaneous edema throughout the right thigh, most consistent with cellulitis. No focal fluid collections identified. 2. No evidence of osteomyelitis or septic arthritis. 3. Previous ORIF of a comminuted distal femoral fracture. The fracture appears healed with bridging bone. 4. Prominent pelvic and right inguinal lymph nodes. Possible lymph node in the superior aspect of the popliteal fossa. These lymph nodes are nonspecific and possibly reactive. 5. Lower leg findings are dictated separately. Electronically Signed   By: Carey Bullocks M.D.   On: 04/30/2023 16:40   CT CHEST ABDOMEN PELVIS WO CONTRAST  Result Date: 05/02/2023 CLINICAL DATA:  67 year old male with altered mental  status, no speech. Possible sepsis. Recently diagnosed with liver masses. EXAM: CT CHEST, ABDOMEN AND PELVIS WITHOUT CONTRAST TECHNIQUE: Multidetector CT imaging of the chest, abdomen and pelvis was performed following the standard protocol without IV contrast. RADIATION DOSE REDUCTION: This exam was performed  joint with joint space narrowing and osteophytes. No significant joint effusions are identified. There is no evidence of osteomyelitis within the lower leg or foot. Right thigh findings are dictated separately. Ligaments Suboptimally assessed by CT. Muscles and Tendons Generalized muscular atrophy throughout the lower leg and foot. As evaluated by CT, the quadriceps and patellar tendons are intact. The ankle tendons appear intact without significant tenosynovitis. Soft tissues There is subcutaneous edema within the lower leg, greatest distally. Foot edema is greatest dorsally. No focal fluid collection, soft tissue emphysema or unexpected foreign bodies are identified in the lower leg or foot. IMPRESSION: 1. Nonspecific subcutaneous edema within the lower leg and foot, greatest dorsally. No focal fluid collection, soft tissue emphysema or unexpected foreign bodies identified. 2. No evidence of osteomyelitis. 3. Posttraumatic deformity of the lateral tibial plateau status post ORIF. 4. Generalized muscular atrophy. 5. Advanced degenerative changes at the 1st metatarsophalangeal joint. Electronically Signed   By: Carey Bullocks M.D.   On: 05/06/2023 16:52   CT FEMUR RIGHT WO CONTRAST  Result Date: 05/08/2023 CLINICAL DATA:  Soft tissue infection suspected. History of right femur fracture with ORIF 02/03/2022. EXAM: CT OF THE LOWER RIGHT EXTREMITY WITHOUT CONTRAST TECHNIQUE: Multidetector CT imaging of the right thigh was performed according to the standard protocol. RADIATION DOSE REDUCTION: This exam was performed according to the departmental dose-optimization program which includes automated exposure control, adjustment of the mA and/or kV according to patient size and/or use of iterative reconstruction technique. COMPARISON:  Right femur radiographs 02/03/2022. Pelvic CT 04/11/2023. FINDINGS: Bones/Joint/Cartilage Status post  right femoral intramedullary nail fixation of a comminuted fracture involving the distal femoral diaphysis. The hardware is intact without loosening. The distal femur fracture appears healed with bridging bone and mild medial displacement at the fracture by 8 mm. No evidence of acute fracture or dislocation. No evidence of osteomyelitis. Lower leg findings are dictated separately. There is no significant knee joint effusion. Mild right hip degenerative changes without significant hip joint effusion. Ligaments Suboptimally assessed by CT. Muscles and Tendons Generalized muscular atrophy noted. No intramuscular fluid collection identified. The quadriceps and patellar tendons are intact. Soft tissues Mild skin thickening and subcutaneous edema throughout the right thigh. The skin thickening appears greatest laterally in the proximal thigh and posteromedially in the distal thigh. Peripherally dense soft tissue nodule superiorly in the popliteal fossa measures 2.5 x 1.7 cm on image 716/5, possibly a lymph node. Prominent external iliac and right inguinal lymph nodes are also noted, similar to recent pelvic CT. No focal fluid collections identified. There are diffuse vascular calcifications. IMPRESSION: 1. Nonspecific skin thickening and subcutaneous edema throughout the right thigh, most consistent with cellulitis. No focal fluid collections identified. 2. No evidence of osteomyelitis or septic arthritis. 3. Previous ORIF of a comminuted distal femoral fracture. The fracture appears healed with bridging bone. 4. Prominent pelvic and right inguinal lymph nodes. Possible lymph node in the superior aspect of the popliteal fossa. These lymph nodes are nonspecific and possibly reactive. 5. Lower leg findings are dictated separately. Electronically Signed   By: Carey Bullocks M.D.   On: 04/30/2023 16:40   CT CHEST ABDOMEN PELVIS WO CONTRAST  Result Date: 05/02/2023 CLINICAL DATA:  67 year old male with altered mental  status, no speech. Possible sepsis. Recently diagnosed with liver masses. EXAM: CT CHEST, ABDOMEN AND PELVIS WITHOUT CONTRAST TECHNIQUE: Multidetector CT imaging of the chest, abdomen and pelvis was performed following the standard protocol without IV contrast. RADIATION DOSE REDUCTION: This exam was performed  exam was performed according to the departmental dose-optimization program which includes automated exposure control, adjustment of the mA and/or kV according to patient size and/or use of iterative reconstruction technique. COMPARISON:  None Available. FINDINGS: Brain: Study is mildly degraded by motion artifact despite repeated imaging attempts. No midline shift, mass effect, or evidence of intracranial mass lesion. No ventriculomegaly. Cerebral volume is probably normal for age. No acute intracranial hemorrhage identified. No cortically based acute infarct identified. There is patchy, moderate bilateral periventricular white matter hypodensity. And there is asymmetric heterogeneity in the thalami, greater on the left (series 2, image 18) compatible with age indeterminate small vessel infarcts. However, on the coronal view the left thalamus hypodensity has a chronic appearance. No superimposed No cortically based acute infarct identified. Vascular: Calcified atherosclerosis at the skull base. No suspicious intracranial vascular hyperdensity. Skull: Pronounced skull base motion artifact. No acute osseous abnormality is evident. Sinuses/Orbits: Motion artifact. No definite sinus or mastoid  opacification. Other: Orbits are obscured by motion. Visualized scalp soft tissues are within normal limits. IMPRESSION: 1. Motion degraded exam. 2. 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 3. No other acute intracranial abnormality. Electronically Signed   By: Odessa Fleming M.D.   On: 04/30/2023 05:20   DG Chest Port 1 View  Result Date: 05/03/2023 CLINICAL DATA:  Questionable sepsis. EXAM: PORTABLE CHEST 1 VIEW COMPARISON:  Portable chest 04/17/2023 FINDINGS: The lungs are expiratory obscuring the lung bases. The visualized lungs are clear. No pleural effusion is seen. 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 Right IJ dialysis catheter again terminates in the right atrium. No new osseous findings. IMPRESSION: Limited study due to expiratory technique. The visualized lungs clear but the lung bases are not seen due to expiration. Stable cardiomegaly and aortic atherosclerosis. Electronically Signed   By: Almira Bar M.D.   On: 05/17/2023 04:12   DG Tibia/Fibula Right  Result Date: 04/17/2023 CLINICAL DATA:  Shortness of breath.  Leg pain EXAM: RIGHT TIBIA AND FIBULA - 2 VIEW COMPARISON:  11/27/2021 FINDINGS: Prior ORIF of the proximal right tibia and distal femur. There is no evidence of acute fracture or other focal bone lesions. Healed fracture deformity of the proximal fibula. Generalized soft tissue swelling. Atherosclerotic vascular calcifications. IMPRESSION: Generalized soft tissue swelling.  No acute findings. Electronically Signed   By: Duanne Guess D.O.   On: 04/17/2023 13:41   DG Chest Portable 1 View  Result Date: 04/17/2023 CLINICAL DATA:  Shortness of breath.  Evaluate for edema. EXAM: PORTABLE CHEST 1 VIEW COMPARISON:  03/20/2023 FINDINGS: There is a right chest wall dialysis catheter with tips in the right atrium. Previous  median sternotomy and CABG procedure. Heart size and mediastinal contours appear normal. Lung volumes are low. Scar versus platelike atelectasis noted in the left base. No pleural effusion, interstitial edema or airspace disease. IMPRESSION: 1. No signs of interstitial edema or pleural effusion. 2. Decreased lung volumes with bibasilar scar versus platelike atelectasis. 3. Low lung volumes. Electronically Signed   By: Signa Kell M.D.   On: 04/17/2023 13:13   US Venous Img Lower Unilateral Right  Result Date: 04/17/2023 CLINICAL DATA:  Right lower extremity edema. EXAM: RIGHT LOWER EXTREMITY VENOUS DOPPLER ULTRASOUND TECHNIQUE: Gray-scale sonography with graded compression, as well as color Doppler and duplex ultrasound were performed to evaluate the lower extremity deep venous systems from the level of the  joint with joint space narrowing and osteophytes. No significant joint effusions are identified. There is no evidence of osteomyelitis within the lower leg or foot. Right thigh findings are dictated separately. Ligaments Suboptimally assessed by CT. Muscles and Tendons Generalized muscular atrophy throughout the lower leg and foot. As evaluated by CT, the quadriceps and patellar tendons are intact. The ankle tendons appear intact without significant tenosynovitis. Soft tissues There is subcutaneous edema within the lower leg, greatest distally. Foot edema is greatest dorsally. No focal fluid collection, soft tissue emphysema or unexpected foreign bodies are identified in the lower leg or foot. IMPRESSION: 1. Nonspecific subcutaneous edema within the lower leg and foot, greatest dorsally. No focal fluid collection, soft tissue emphysema or unexpected foreign bodies identified. 2. No evidence of osteomyelitis. 3. Posttraumatic deformity of the lateral tibial plateau status post ORIF. 4. Generalized muscular atrophy. 5. Advanced degenerative changes at the 1st metatarsophalangeal joint. Electronically Signed   By: Carey Bullocks M.D.   On: 05/06/2023 16:52   CT FEMUR RIGHT WO CONTRAST  Result Date: 05/08/2023 CLINICAL DATA:  Soft tissue infection suspected. History of right femur fracture with ORIF 02/03/2022. EXAM: CT OF THE LOWER RIGHT EXTREMITY WITHOUT CONTRAST TECHNIQUE: Multidetector CT imaging of the right thigh was performed according to the standard protocol. RADIATION DOSE REDUCTION: This exam was performed according to the departmental dose-optimization program which includes automated exposure control, adjustment of the mA and/or kV according to patient size and/or use of iterative reconstruction technique. COMPARISON:  Right femur radiographs 02/03/2022. Pelvic CT 04/11/2023. FINDINGS: Bones/Joint/Cartilage Status post  right femoral intramedullary nail fixation of a comminuted fracture involving the distal femoral diaphysis. The hardware is intact without loosening. The distal femur fracture appears healed with bridging bone and mild medial displacement at the fracture by 8 mm. No evidence of acute fracture or dislocation. No evidence of osteomyelitis. Lower leg findings are dictated separately. There is no significant knee joint effusion. Mild right hip degenerative changes without significant hip joint effusion. Ligaments Suboptimally assessed by CT. Muscles and Tendons Generalized muscular atrophy noted. No intramuscular fluid collection identified. The quadriceps and patellar tendons are intact. Soft tissues Mild skin thickening and subcutaneous edema throughout the right thigh. The skin thickening appears greatest laterally in the proximal thigh and posteromedially in the distal thigh. Peripherally dense soft tissue nodule superiorly in the popliteal fossa measures 2.5 x 1.7 cm on image 716/5, possibly a lymph node. Prominent external iliac and right inguinal lymph nodes are also noted, similar to recent pelvic CT. No focal fluid collections identified. There are diffuse vascular calcifications. IMPRESSION: 1. Nonspecific skin thickening and subcutaneous edema throughout the right thigh, most consistent with cellulitis. No focal fluid collections identified. 2. No evidence of osteomyelitis or septic arthritis. 3. Previous ORIF of a comminuted distal femoral fracture. The fracture appears healed with bridging bone. 4. Prominent pelvic and right inguinal lymph nodes. Possible lymph node in the superior aspect of the popliteal fossa. These lymph nodes are nonspecific and possibly reactive. 5. Lower leg findings are dictated separately. Electronically Signed   By: Carey Bullocks M.D.   On: 04/30/2023 16:40   CT CHEST ABDOMEN PELVIS WO CONTRAST  Result Date: 05/02/2023 CLINICAL DATA:  67 year old male with altered mental  status, no speech. Possible sepsis. Recently diagnosed with liver masses. EXAM: CT CHEST, ABDOMEN AND PELVIS WITHOUT CONTRAST TECHNIQUE: Multidetector CT imaging of the chest, abdomen and pelvis was performed following the standard protocol without IV contrast. RADIATION DOSE REDUCTION: This exam was performed  exam was performed according to the departmental dose-optimization program which includes automated exposure control, adjustment of the mA and/or kV according to patient size and/or use of iterative reconstruction technique. COMPARISON:  None Available. FINDINGS: Brain: Study is mildly degraded by motion artifact despite repeated imaging attempts. No midline shift, mass effect, or evidence of intracranial mass lesion. No ventriculomegaly. Cerebral volume is probably normal for age. No acute intracranial hemorrhage identified. No cortically based acute infarct identified. There is patchy, moderate bilateral periventricular white matter hypodensity. And there is asymmetric heterogeneity in the thalami, greater on the left (series 2, image 18) compatible with age indeterminate small vessel infarcts. However, on the coronal view the left thalamus hypodensity has a chronic appearance. No superimposed No cortically based acute infarct identified. Vascular: Calcified atherosclerosis at the skull base. No suspicious intracranial vascular hyperdensity. Skull: Pronounced skull base motion artifact. No acute osseous abnormality is evident. Sinuses/Orbits: Motion artifact. No definite sinus or mastoid  opacification. Other: Orbits are obscured by motion. Visualized scalp soft tissues are within normal limits. IMPRESSION: 1. Motion degraded exam. 2. 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 3. No other acute intracranial abnormality. Electronically Signed   By: Odessa Fleming M.D.   On: 04/30/2023 05:20   DG Chest Port 1 View  Result Date: 05/03/2023 CLINICAL DATA:  Questionable sepsis. EXAM: PORTABLE CHEST 1 VIEW COMPARISON:  Portable chest 04/17/2023 FINDINGS: The lungs are expiratory obscuring the lung bases. The visualized lungs are clear. No pleural effusion is seen. 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 Right IJ dialysis catheter again terminates in the right atrium. No new osseous findings. IMPRESSION: Limited study due to expiratory technique. The visualized lungs clear but the lung bases are not seen due to expiration. Stable cardiomegaly and aortic atherosclerosis. Electronically Signed   By: Almira Bar M.D.   On: 05/17/2023 04:12   DG Tibia/Fibula Right  Result Date: 04/17/2023 CLINICAL DATA:  Shortness of breath.  Leg pain EXAM: RIGHT TIBIA AND FIBULA - 2 VIEW COMPARISON:  11/27/2021 FINDINGS: Prior ORIF of the proximal right tibia and distal femur. There is no evidence of acute fracture or other focal bone lesions. Healed fracture deformity of the proximal fibula. Generalized soft tissue swelling. Atherosclerotic vascular calcifications. IMPRESSION: Generalized soft tissue swelling.  No acute findings. Electronically Signed   By: Duanne Guess D.O.   On: 04/17/2023 13:41   DG Chest Portable 1 View  Result Date: 04/17/2023 CLINICAL DATA:  Shortness of breath.  Evaluate for edema. EXAM: PORTABLE CHEST 1 VIEW COMPARISON:  03/20/2023 FINDINGS: There is a right chest wall dialysis catheter with tips in the right atrium. Previous  median sternotomy and CABG procedure. Heart size and mediastinal contours appear normal. Lung volumes are low. Scar versus platelike atelectasis noted in the left base. No pleural effusion, interstitial edema or airspace disease. IMPRESSION: 1. No signs of interstitial edema or pleural effusion. 2. Decreased lung volumes with bibasilar scar versus platelike atelectasis. 3. Low lung volumes. Electronically Signed   By: Signa Kell M.D.   On: 04/17/2023 13:13   US Venous Img Lower Unilateral Right  Result Date: 04/17/2023 CLINICAL DATA:  Right lower extremity edema. EXAM: RIGHT LOWER EXTREMITY VENOUS DOPPLER ULTRASOUND TECHNIQUE: Gray-scale sonography with graded compression, as well as color Doppler and duplex ultrasound were performed to evaluate the lower extremity deep venous systems from the level of the

## 2023-05-03 NOTE — Progress Notes (Signed)
05/02/23 1318 05/02/23 2109 05/03/23 0545  WBC 9.4 9.5 8.6 8.0 8.3 9.0  NEUTROABS 8.8*  --   --  7.7  --   --   HGB 11.7* 11.4* 10.9* 10.6* 10.6* 10.5*  HCT 36.0* 33.2* 33.2* 32.0* 31.3* 30.7*  MCV 101.7* 97.1 98.8 97.6 95.7 96.2  PLT 77* 71* 63* 53* 53* 72*    Cardiac Enzymes: No results for input(s): "CKTOTAL", "CKMB", "CKMBINDEX", "TROPONINI" in the last 168 hours.  BNP: Invalid input(s): "POCBNP"  CBG: Recent Labs  Lab 05/02/23 2357 05/03/23 0445 05/03/23 0805 05/03/23 1205 05/03/23 1610  GLUCAP 117* 121* 103* 122* 114*    Microbiology: Results for orders placed or performed  during the hospital encounter of 04/28/2023  Resp panel by RT-PCR (RSV, Flu A&B, Covid) Anterior Nasal Swab     Status: None   Collection Time: 05/05/2023  3:31 AM   Specimen: Anterior Nasal Swab  Result Value Ref Range Status   SARS Coronavirus 2 by RT PCR NEGATIVE NEGATIVE Final    Comment: (NOTE) SARS-CoV-2 target nucleic acids are NOT DETECTED.  The SARS-CoV-2 RNA is generally detectable in upper respiratory specimens during the acute phase of infection. The lowest concentration of SARS-CoV-2 viral copies this assay can detect is 138 copies/mL. A negative result does not preclude SARS-Cov-2 infection and should not be used as the sole basis for treatment or other patient management decisions. A negative result may occur with  improper specimen collection/handling, submission of specimen other than nasopharyngeal swab, presence of viral mutation(s) within the areas targeted by this assay, and inadequate number of viral copies(<138 copies/mL). A negative result must be combined with clinical observations, patient history, and epidemiological information. The expected result is Negative.  Fact Sheet for Patients:  BloggerCourse.com  Fact Sheet for Healthcare Providers:  SeriousBroker.it  This test is no t yet approved or cleared by the [place] FDA and  has been authorized for detection and/or diagnosis of SARS-CoV-2 by FDA under an Emergency Use Authorization (EUA). This EUA will remain  in effect (meaning this test can be used) for the duration of the COVID-19 declaration under Section 564(b)(1) of the Act, 21 U.S.C.section 360bbb-3(b)(1), unless the authorization is terminated  or revoked sooner.       Influenza A by PCR NEGATIVE NEGATIVE Final   Influenza B by PCR NEGATIVE NEGATIVE Final    Comment: (NOTE) The Xpert Xpress SARS-CoV-2/FLU/RSV plus assay is intended as an aid in the diagnosis of influenza from  Nasopharyngeal swab specimens and should not be used as a sole basis for treatment. Nasal washings and aspirates are unacceptable for Xpert Xpress SARS-CoV-2/FLU/RSV testing.  Fact Sheet for Patients: BloggerCourse.com  Fact Sheet for Healthcare Providers: SeriousBroker.it  This test is not yet approved or cleared by the [place] FDA and has been authorized for detection and/or diagnosis of SARS-CoV-2 by FDA under an Emergency Use Authorization (EUA). This EUA will remain in effect (meaning this test can be used) for the duration of the COVID-19 declaration under Section 564(b)(1) of the Act, 21 U.S.C. section 360bbb-3(b)(1), unless the authorization is terminated or revoked.     Resp Syncytial Virus by PCR NEGATIVE NEGATIVE Final    Comment: (NOTE) Fact Sheet for Patients: BloggerCourse.com  Fact Sheet for Healthcare Providers: SeriousBroker.it  This test is not yet approved or cleared by the [place] FDA and has been authorized for detection and/or diagnosis of SARS-CoV-2 by FDA under an Emergency Use Authorization (EUA). This EUA will remain in effect (  meaning this test can be used) for the duration of the COVID-19 declaration under Section 564(b)(1) of the Act, 21 U.S.C. section 360bbb-3(b)(1), unless the authorization is terminated or revoked.  Performed at [facility], [address] [place], [place] [address]   Blood Culture (routine x 2)     Status: None (Preliminary result)   Collection Time: 05/03/2023  3:31 AM   Specimen: BLOOD RIGHT ARM  Result Value Ref Range Status   Specimen Description BLOOD RIGHT ARM  Final   Special Requests   Final    BOTTLES DRAWN AEROBIC AND ANAEROBIC Blood Culture adequate volume   Culture   Final    NO GROWTH 2 DAYS Performed at [facility], [address] [place], [place] [address]    Report Status  PENDING  Incomplete  Blood Culture (routine x 2)     Status: None (Preliminary result)   Collection Time: 04/29/2023  3:34 AM   Specimen: BLOOD LEFT ARM  Result Value Ref Range Status   Specimen Description BLOOD LEFT ARM  Final   Special Requests   Final    BOTTLES DRAWN AEROBIC AND ANAEROBIC Blood Culture adequate volume   Culture   Final    NO GROWTH 2 DAYS Performed at [facility], [address], [place], [place] [address]    Report Status PENDING  Incomplete  Gastrointestinal Panel by PCR , Stool     Status: Abnormal   Collection Time: 05/18/2023  3:34 AM   Specimen: Stool  Result Value Ref Range Status   Campylobacter species NOT DETECTED NOT DETECTED Final   Plesimonas shigelloides NOT DETECTED NOT DETECTED Final   Salmonella species NOT DETECTED NOT DETECTED Final   Yersinia enterocolitica NOT DETECTED NOT DETECTED Final   Vibrio species NOT DETECTED NOT DETECTED Final   Vibrio cholerae NOT DETECTED NOT DETECTED Final   Enteroaggregative E coli (EAEC) DETECTED (A) NOT DETECTED Final    Comment: RESULT CALLED TO, READ BACK BY AND VERIFIED WITH: Encarnacion Slates [facility] 05/13/2023 9147 MW    Enteropathogenic E coli (EPEC) NOT DETECTED NOT DETECTED Final   Enterotoxigenic E coli (ETEC) NOT DETECTED NOT DETECTED Final   Shiga like toxin producing E coli (STEC) NOT DETECTED NOT DETECTED Final   Shigella/Enteroinvasive E coli (EIEC) NOT DETECTED NOT DETECTED Final   Cryptosporidium NOT DETECTED NOT DETECTED Final   Cyclospora cayetanensis NOT DETECTED NOT DETECTED Final   Entamoeba histolytica NOT DETECTED NOT DETECTED Final   Giardia lamblia NOT DETECTED NOT DETECTED Final   Adenovirus F40/41 NOT DETECTED NOT DETECTED Final   Astrovirus NOT DETECTED NOT DETECTED Final   Norovirus GI/GII NOT DETECTED NOT DETECTED Final   Rotavirus A NOT DETECTED NOT DETECTED Final   Sapovirus (I, II, IV, and V) NOT DETECTED NOT DETECTED Final    Comment: Performed at [facility], [address] [place], [place] [address]  C Difficile Quick Screen w PCR reflex     Status: None   Collection Time: 04/27/2023  3:34 AM   Specimen: STOOL  Result Value Ref Range Status   C Diff antigen NEGATIVE NEGATIVE Final   C Diff toxin NEGATIVE NEGATIVE Final   C Diff interpretation No C. difficile detected.  Final    Comment: Performed at [facility], [address] [place], [place] [address]  MRSA Next Gen by PCR, Nasal     Status: None   Collection Time: 04/30/2023 10:56 AM   Specimen: Nasal Mucosa; Nasal Swab  05/02/23 1318 05/02/23 2109 05/03/23 0545  WBC 9.4 9.5 8.6 8.0 8.3 9.0  NEUTROABS 8.8*  --   --  7.7  --   --   HGB 11.7* 11.4* 10.9* 10.6* 10.6* 10.5*  HCT 36.0* 33.2* 33.2* 32.0* 31.3* 30.7*  MCV 101.7* 97.1 98.8 97.6 95.7 96.2  PLT 77* 71* 63* 53* 53* 72*    Cardiac Enzymes: No results for input(s): "CKTOTAL", "CKMB", "CKMBINDEX", "TROPONINI" in the last 168 hours.  BNP: Invalid input(s): "POCBNP"  CBG: Recent Labs  Lab 05/02/23 2357 05/03/23 0445 05/03/23 0805 05/03/23 1205 05/03/23 1610  GLUCAP 117* 121* 103* 122* 114*    Microbiology: Results for orders placed or performed  during the hospital encounter of 04/28/2023  Resp panel by RT-PCR (RSV, Flu A&B, Covid) Anterior Nasal Swab     Status: None   Collection Time: 05/05/2023  3:31 AM   Specimen: Anterior Nasal Swab  Result Value Ref Range Status   SARS Coronavirus 2 by RT PCR NEGATIVE NEGATIVE Final    Comment: (NOTE) SARS-CoV-2 target nucleic acids are NOT DETECTED.  The SARS-CoV-2 RNA is generally detectable in upper respiratory specimens during the acute phase of infection. The lowest concentration of SARS-CoV-2 viral copies this assay can detect is 138 copies/mL. A negative result does not preclude SARS-Cov-2 infection and should not be used as the sole basis for treatment or other patient management decisions. A negative result may occur with  improper specimen collection/handling, submission of specimen other than nasopharyngeal swab, presence of viral mutation(s) within the areas targeted by this assay, and inadequate number of viral copies(<138 copies/mL). A negative result must be combined with clinical observations, patient history, and epidemiological information. The expected result is Negative.  Fact Sheet for Patients:  BloggerCourse.com  Fact Sheet for Healthcare Providers:  SeriousBroker.it  This test is no t yet approved or cleared by the [place] FDA and  has been authorized for detection and/or diagnosis of SARS-CoV-2 by FDA under an Emergency Use Authorization (EUA). This EUA will remain  in effect (meaning this test can be used) for the duration of the COVID-19 declaration under Section 564(b)(1) of the Act, 21 U.S.C.section 360bbb-3(b)(1), unless the authorization is terminated  or revoked sooner.       Influenza A by PCR NEGATIVE NEGATIVE Final   Influenza B by PCR NEGATIVE NEGATIVE Final    Comment: (NOTE) The Xpert Xpress SARS-CoV-2/FLU/RSV plus assay is intended as an aid in the diagnosis of influenza from  Nasopharyngeal swab specimens and should not be used as a sole basis for treatment. Nasal washings and aspirates are unacceptable for Xpert Xpress SARS-CoV-2/FLU/RSV testing.  Fact Sheet for Patients: BloggerCourse.com  Fact Sheet for Healthcare Providers: SeriousBroker.it  This test is not yet approved or cleared by the [place] FDA and has been authorized for detection and/or diagnosis of SARS-CoV-2 by FDA under an Emergency Use Authorization (EUA). This EUA will remain in effect (meaning this test can be used) for the duration of the COVID-19 declaration under Section 564(b)(1) of the Act, 21 U.S.C. section 360bbb-3(b)(1), unless the authorization is terminated or revoked.     Resp Syncytial Virus by PCR NEGATIVE NEGATIVE Final    Comment: (NOTE) Fact Sheet for Patients: BloggerCourse.com  Fact Sheet for Healthcare Providers: SeriousBroker.it  This test is not yet approved or cleared by the [place] FDA and has been authorized for detection and/or diagnosis of SARS-CoV-2 by FDA under an Emergency Use Authorization (EUA). This EUA will remain in effect (  meaning this test can be used) for the duration of the COVID-19 declaration under Section 564(b)(1) of the Act, 21 U.S.C. section 360bbb-3(b)(1), unless the authorization is terminated or revoked.  Performed at [facility], [address] [place], [place] [address]   Blood Culture (routine x 2)     Status: None (Preliminary result)   Collection Time: 05/03/2023  3:31 AM   Specimen: BLOOD RIGHT ARM  Result Value Ref Range Status   Specimen Description BLOOD RIGHT ARM  Final   Special Requests   Final    BOTTLES DRAWN AEROBIC AND ANAEROBIC Blood Culture adequate volume   Culture   Final    NO GROWTH 2 DAYS Performed at [facility], [address] [place], [place] [address]    Report Status  PENDING  Incomplete  Blood Culture (routine x 2)     Status: None (Preliminary result)   Collection Time: 04/29/2023  3:34 AM   Specimen: BLOOD LEFT ARM  Result Value Ref Range Status   Specimen Description BLOOD LEFT ARM  Final   Special Requests   Final    BOTTLES DRAWN AEROBIC AND ANAEROBIC Blood Culture adequate volume   Culture   Final    NO GROWTH 2 DAYS Performed at [facility], [address], [place], [place] [address]    Report Status PENDING  Incomplete  Gastrointestinal Panel by PCR , Stool     Status: Abnormal   Collection Time: 05/18/2023  3:34 AM   Specimen: Stool  Result Value Ref Range Status   Campylobacter species NOT DETECTED NOT DETECTED Final   Plesimonas shigelloides NOT DETECTED NOT DETECTED Final   Salmonella species NOT DETECTED NOT DETECTED Final   Yersinia enterocolitica NOT DETECTED NOT DETECTED Final   Vibrio species NOT DETECTED NOT DETECTED Final   Vibrio cholerae NOT DETECTED NOT DETECTED Final   Enteroaggregative E coli (EAEC) DETECTED (A) NOT DETECTED Final    Comment: RESULT CALLED TO, READ BACK BY AND VERIFIED WITH: Encarnacion Slates [facility] 05/13/2023 9147 MW    Enteropathogenic E coli (EPEC) NOT DETECTED NOT DETECTED Final   Enterotoxigenic E coli (ETEC) NOT DETECTED NOT DETECTED Final   Shiga like toxin producing E coli (STEC) NOT DETECTED NOT DETECTED Final   Shigella/Enteroinvasive E coli (EIEC) NOT DETECTED NOT DETECTED Final   Cryptosporidium NOT DETECTED NOT DETECTED Final   Cyclospora cayetanensis NOT DETECTED NOT DETECTED Final   Entamoeba histolytica NOT DETECTED NOT DETECTED Final   Giardia lamblia NOT DETECTED NOT DETECTED Final   Adenovirus F40/41 NOT DETECTED NOT DETECTED Final   Astrovirus NOT DETECTED NOT DETECTED Final   Norovirus GI/GII NOT DETECTED NOT DETECTED Final   Rotavirus A NOT DETECTED NOT DETECTED Final   Sapovirus (I, II, IV, and V) NOT DETECTED NOT DETECTED Final    Comment: Performed at [facility], [address] [place], [place] [address]  C Difficile Quick Screen w PCR reflex     Status: None   Collection Time: 04/27/2023  3:34 AM   Specimen: STOOL  Result Value Ref Range Status   C Diff antigen NEGATIVE NEGATIVE Final   C Diff toxin NEGATIVE NEGATIVE Final   C Diff interpretation No C. difficile detected.  Final    Comment: Performed at [facility], [address] [place], [place] [address]  MRSA Next Gen by PCR, Nasal     Status: None   Collection Time: 04/30/2023 10:56 AM   Specimen: Nasal Mucosa; Nasal Swab

## 2023-05-03 NOTE — Consult Note (Signed)
PHARMACY CONSULT NOTE  Pharmacy Consult for Electrolyte Monitoring and Replacement   Recent Labs: Potassium (mmol/L)  Date Value  05/03/2023 4.0   Magnesium (mg/dL)  Date Value  64/33/2951 2.5 (H)   Calcium (mg/dL)  Date Value  88/41/6606 8.4 (L)   Albumin (g/dL)  Date Value  30/16/0109 2.2 (L)   Phosphorus (mg/dL)  Date Value  32/35/5732 2.9   Sodium (mmol/L)  Date Value  05/03/2023 138   Assessment: 67 y/o M with medical history including ESRD on HD, metastatic prostate cancer on ADT, CAD s/p CABG, ischemic cardiomyopathy, Afib on apixaban, DM, HTN, duodenal ulcers, cognitive deficits, alcohol use disorder admitted with encephalopathy in setting of sepsis / uremia.   Patient currently on CRRT as of 05/10/2023  Goal of Therapy:  Electrolytes within normal limits  Plan:  --No electrolyte supplementation needed today --K 4.0, management with CRRT K+ bath at 4.0, per nephrology --Re-check labs in afternoon  Darolyn Rua, PharmD Student [employer]

## 2023-05-03 NOTE — Plan of Care (Signed)
  Problem: Clinical Measurements: Goal: Ability to maintain clinical measurements within normal limits will improve Outcome: Not Progressing Goal: Diagnostic test results will improve Outcome: Not Progressing Goal: Cardiovascular complication will be avoided Outcome: Not Progressing   Problem: Nutrition: Goal: Adequate nutrition will be maintained Outcome: Not Progressing   Problem: Coping: Goal: Level of anxiety will decrease Outcome: Not Progressing   Problem: Pain Managment: Goal: General experience of comfort will improve Outcome: Not Progressing

## 2023-05-04 DIAGNOSIS — R6520 Severe sepsis without septic shock: Secondary | ICD-10-CM | POA: Diagnosis not present

## 2023-05-04 DIAGNOSIS — A419 Sepsis, unspecified organism: Secondary | ICD-10-CM | POA: Diagnosis not present

## 2023-05-04 LAB — PROTIME-INR
INR: 2.9 — ABNORMAL HIGH (ref 0.8–1.2)
Prothrombin Time: 30.7 s — ABNORMAL HIGH (ref 11.4–15.2)

## 2023-05-04 LAB — HEPATIC FUNCTION PANEL
ALT: 542 U/L — ABNORMAL HIGH (ref 0–44)
AST: 1188 U/L — ABNORMAL HIGH (ref 15–41)
Albumin: 2.4 g/dL — ABNORMAL LOW (ref 3.5–5.0)
Alkaline Phosphatase: 378 U/L — ABNORMAL HIGH (ref 38–126)
Bilirubin, Direct: 2.5 mg/dL — ABNORMAL HIGH (ref 0.0–0.2)
Indirect Bilirubin: 1.5 mg/dL — ABNORMAL HIGH (ref 0.3–0.9)
Total Bilirubin: 4 mg/dL — ABNORMAL HIGH (ref 0.3–1.2)
Total Protein: 5.6 g/dL — ABNORMAL LOW (ref 6.5–8.1)

## 2023-05-04 LAB — CBC
HCT: 30.4 % — ABNORMAL LOW (ref 39.0–52.0)
Hemoglobin: 9.9 g/dL — ABNORMAL LOW (ref 13.0–17.0)
MCH: 32 pg (ref 26.0–34.0)
MCHC: 32.6 g/dL (ref 30.0–36.0)
MCV: 98.4 fL (ref 80.0–100.0)
Platelets: 44 10*3/uL — ABNORMAL LOW (ref 150–400)
RBC: 3.09 MIL/uL — ABNORMAL LOW (ref 4.22–5.81)
RDW: 17.9 % — ABNORMAL HIGH (ref 11.5–15.5)
WBC: 8.8 10*3/uL (ref 4.0–10.5)
nRBC: 14.3 % — ABNORMAL HIGH (ref 0.0–0.2)

## 2023-05-04 LAB — BPAM PLATELET PHERESIS
Blood Product Expiration Date: 202409122359
ISSUE DATE / TIME: 202409110141
Unit Type and Rh: 6200

## 2023-05-04 LAB — PREPARE FRESH FROZEN PLASMA: Unit division: 0

## 2023-05-04 LAB — BPAM FFP
Blood Product Expiration Date: 202409162359
Blood Product Expiration Date: 202409162359
ISSUE DATE / TIME: 202409111107
ISSUE DATE / TIME: 202409111308
Unit Type and Rh: 5100
Unit Type and Rh: 5100

## 2023-05-04 LAB — RENAL FUNCTION PANEL
Albumin: 2.4 g/dL — ABNORMAL LOW (ref 3.5–5.0)
Anion gap: 14 (ref 5–15)
BUN: 15 mg/dL (ref 8–23)
CO2: 22 mmol/L (ref 22–32)
Calcium: 8.4 mg/dL — ABNORMAL LOW (ref 8.9–10.3)
Chloride: 101 mmol/L (ref 98–111)
Creatinine, Ser: 1.02 mg/dL (ref 0.61–1.24)
GFR, Estimated: >60 mL/min (ref >60)
Glucose, Bld: 113 mg/dL — ABNORMAL HIGH (ref 70–99)
Phosphorus: 2.3 mg/dL — ABNORMAL LOW (ref 2.5–4.6)
Potassium: 4.1 mmol/L (ref 3.5–5.1)
Sodium: 137 mmol/L (ref 135–145)

## 2023-05-04 LAB — MAGNESIUM: Magnesium: 2.5 mg/dL — ABNORMAL HIGH (ref 1.7–2.4)

## 2023-05-04 LAB — PREPARE PLATELET PHERESIS: Unit division: 0

## 2023-05-04 LAB — GLUCOSE, CAPILLARY
Glucose-Capillary: 101 mg/dL — ABNORMAL HIGH (ref 70–99)
Glucose-Capillary: 110 mg/dL — ABNORMAL HIGH (ref 70–99)
Glucose-Capillary: 89 mg/dL (ref 70–99)

## 2023-05-04 MED ORDER — AMIODARONE HCL IN DEXTROSE 360-4.14 MG/200ML-% IV SOLN
60.0000 mg/h | INTRAVENOUS | Status: DC
  Administered 2023-05-04: 60 mg/h via INTRAVENOUS
  Filled 2023-05-04: qty 200

## 2023-05-04 MED ORDER — ORAL CARE MOUTH RINSE: 15.0000 mL | OROMUCOSAL | Status: DC | PRN

## 2023-05-04 MED ORDER — DEXMEDETOMIDINE HCL IN NACL 400 MCG/100ML IV SOLN
0.0000 ug/kg/h | INTRAVENOUS | Status: DC
  Administered 2023-05-04: 0.1 ug/kg/h via INTRAVENOUS
  Filled 2023-05-04: qty 100

## 2023-05-04 MED ORDER — AMIODARONE IV BOLUS ONLY 150 MG/100ML
150.0000 mg | Freq: Once | INTRAVENOUS | Status: AC
  Administered 2023-05-04: 150 mg via INTRAVENOUS
  Filled 2023-05-04: qty 100

## 2023-05-04 MED ORDER — AMIODARONE HCL IN DEXTROSE 360-4.14 MG/200ML-% IV SOLN: 30.0000 mg/h | INTRAVENOUS | Status: DC

## 2023-05-04 MED ORDER — GLYCOPYRROLATE 0.2 MG/ML IJ SOLN
0.2000 mg | INTRAMUSCULAR | Status: DC | PRN
  Administered 2023-05-04: 0.2 mg via INTRAVENOUS
  Filled 2023-05-04: qty 1

## 2023-05-04 MED ORDER — ORAL CARE MOUTH RINSE: 15.0000 mL | OROMUCOSAL | Status: DC

## 2023-05-06 LAB — CULTURE, BLOOD (ROUTINE X 2)
Culture: NO GROWTH
Culture: NO GROWTH
Special Requests: ADEQUATE
Special Requests: ADEQUATE

## 2023-05-23 NOTE — Progress Notes (Signed)
05/16/2023 1000  Spiritual Encounters  Type of Visit Initial  Care provided to: [facility] partners present during encounter Nurse  Referral source Family  Reason for visit End-of-life  OnCall Visit Yes  Spiritual Framework  Presenting Themes Meaning/purpose/sources of inspiration  Family Stress Factors Loss  Interventions  Spiritual Care Interventions Made Established relationship of care and support;Compassionate presence;Reflective listening;Prayer  Spiritual Care Plan  Spiritual Care Issues Still Outstanding No further spiritual care needs at this time (see row info)   Prayer with the deceased patient daughter and ex-wife. Advise them that we are here for them if they need [place]. They said that there will be no more family come up to visit the deceased.

## 2023-05-23 NOTE — Progress Notes (Signed)
Dr. Belia Heman spoke with patient's daughter via telephone who is on her way to the hospital.  HR and RR continue to be elevated. Orders received to start precedex drip. Patient switched to HFNC at 6 liters.

## 2023-05-23 NOTE — Progress Notes (Signed)
Daughter present at bedside. Dr. Belia Heman to bedside. Patient became bradycardic with HR 30s-50s. Precedex and Amiodarone turned off. Other family member to bedside. Monitor showed asystole. Time of death 1003 pronounced by Durwin Reges, RN and Danella Maiers. Dr. Belia Heman made aware. IV infusions and CRRT stopped. Chaplain to bedside.

## 2023-05-23 NOTE — Progress Notes (Signed)
0745: Pt very restless, diaphoretic, shaking head back and forth, no eye contact or ability to follow commands. Patient grunting intermittently. Lung sounds rhonchus and patient gurgling. HR fluctuating from low 100s to 180s. Dr. Belia Heman made aware. Orders received for Amiodarone bolus and robinul.  0815: Robinul administered and Amiodarone bolus complete. HR now sustaining 160s-180s. Elvina Sidle, NP notified via secure chat. Orders to give 12.5 mcg of fentanyl and start amiodarone infusion. Oxygen increased to 4 Liters Nasal cannula.

## 2023-05-23 NOTE — Progress Notes (Signed)
NAME:  Micheal Aguirre, MRN:  [id number], DOB:  [birth year], LOS: 3 ADMISSION DATE:  05-29-2023, CONSULTATION DATE:  29-May-2023 REFERRING MD:  Rochele Raring,   CHIEF COMPLAINT:  Altered Mental Status    HPI:  67 y.o. 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  On review of chart patient was recently admitted from 04/17/2023 to 04/25/2023 with cellulitis of right leg for which she completed 8 days of IV antibiotics and was discharged on Keflex for additional 5 days.    ED Course: Initial vital signs showed HR of beats/minute, BP 117/81 mm Hg, the RR 17 breaths/minute, and the oxygen saturation 98% on and a temperature of 95.52F (35.2C).  Na+/ K+: 130/7.4 Glucose: 182 BUN/Cr.:75/6.68 Calcium: 7.1 AST/ALT:808/403, CO2 18, Anion Gap 22 WBC: 9.4 Hgb/Hct: 11.7/36.0 Plts: 77 PCT:79.84 Lactic acid: 4.8 COVID PCR: Negative,  VBG: pO2 34; pCO2 35; pH 7.31;  HCO3 17.6, %O2 Sat 52.3.  CXR> CTH> CTA Chest> CT Abd/pelvis>see below  Patient given 30 cc/kg of fluids and started on broad-spectrum antibiotics Vanco cefepime and Flagyl for sepsis with septic shock. Patient remained hypotensive despite IVF boluses therefore was started on Levophed.  (Sepsis reassessment completed). PCCM consulted.  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, metastatic prostate cancer on indefinite ADT, CAD s/p CABG, ischemic cardiomyopathy, atrial fibrillation on Eliquis, type 2 diabetes, hypertension, chronic renal disease, duodenal ulcers, cognitive deficits, history of alcohol use disorder  Significant Hospital Events   9/9:Admit with metabolic  encephalopathy in the setting of severe sepsis/uremia 29-May-2023 family discussion LIMITED CODE 05-29-2023 CT scans of RT leg c/w cellulitis 9/10 remains metabolic encephalopathy in the setting of severe sepsis/uremia LIVER FAILURE 9/11 remains metabolic encephalopathy in the setting of severe sepsis/uremia LIVER FAILURE 9/12 remains metabolic encephalopathy in the setting of severe sepsis/uremia LIVER FAILURE  Consults:  Nephrology  Procedures:  None  Significant Diagnostic Tests:  05-29-2023: Chest Xray>IMPRESSION: Limited study due to expiratory technique. The visualized lungs clear but the lung bases are not seen due to expiration. Stable cardiomegaly and aortic atherosclerosis.  05-29-23: Noncontrast CT head>IMPRESSION: 1. Motion degraded exam. 2. 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 3. No other acute intracranial abnormality.  IMPRESSION: 1. Progressed hepatomegaly and liver extensive infiltration by indistinct hypodense masses since CT last month. Unclear whether this is Primary Versus Metastatic Liver Tumor. But there is associated Pathologic Retroperitoneal and Pelvic/inguinal Lymphadenopathy. Plus diffusely heterogeneous bone mineralization raising the possibility of skeletal metastases.   2. But no superimposed new/acute or inflammatory process is identified in the noncontrast chest, abdomen, or pelvis.   3.  Aortic Atherosclerosis (ICD10-I70.0). 05-29-2023: CT Chest, abdomen and pelvis>  Interim History / Subjective:    Remains encephalopathic Remains on pressors Remains on CCRT +severe liver failure Moans and groans in pain Patient is in the dying process Bleeding from CVL  High risk for bleeding   Micro Data:  05/29/23: SARS-CoV-2 PCR> negative 2023/05/29: Influenza PCR> negative 05/29/23: Blood culture x2> 05/29/23: MRSA PCR>>   Antimicrobials:   Antibiotics Given (last 72 hours)     Date/Time Action  Medication  Dose Rate   14-May-2023 1027 New Bag/Given   vancomycin (VANCOREADY) IVPB 2000 mg/400 mL 2,000 mg 200 mL/hr   05/14/23 1612 New Bag/Given   piperacillin-tazobactam (ZOSYN) IVPB 3.375 g 3.375 g 12.5 mL/hr   05/02/23 0038 New Bag/Given   piperacillin-tazobactam (ZOSYN) IVPB 3.375 g 3.375 g 12.5 mL/hr   05/02/23 0748 New Bag/Given   piperacillin-tazobactam (ZOSYN) IVPB 3.375 g 3.375 g 12.5 mL/hr   05/02/23 0957 New Bag/Given   vancomycin (VANCOCIN) IVPB 1000 mg/200 mL premix 1,000 mg 200 mL/hr   05/02/23 1616 New Bag/Given   piperacillin-tazobactam (ZOSYN) IVPB 3.375 g 3.375 g 12.5 mL/hr   05/03/23 0000 New Bag/Given   piperacillin-tazobactam (ZOSYN) IVPB 3.375 g 3.375 g 12.5 mL/hr   05/03/23 0803 New Bag/Given   piperacillin-tazobactam (ZOSYN) IVPB 3.375 g 3.375 g 12.5 mL/hr   05/03/23 1612 New Bag/Given   piperacillin-tazobactam (ZOSYN) IVPB 3.375 g 3.375 g 12.5 mL/hr        OBJECTIVE  Blood pressure 100/74, pulse 72, temperature 98.8 F (37.1 C), temperature source Axillary, resp. rate (!) 35, height 5\' 9"  (1.753 m), weight 91.1 kg, SpO2 98%.        Intake/Output Summary (Last 24 hours) at 05/16/2023 0707 Last data filed at 05/12/2023 0600 Gross per 24 hour  Intake 1194.39 ml  Output 1283 ml  Net -88.61 ml   Filed Weights   05/02/23 0500 05/03/23 0500 04/30/2023 0500  Weight: 79.8 kg 93.7 kg 91.1 kg     REVIEW OF SYSTEMS  PATIENT IS UNABLE TO PROVIDE COMPLETE REVIEW OF SYSTEMS DUE TO SEVERE CRITICAL ILLNESS   PHYSICAL EXAMINATION:  GENERAL:critically ill appearing, +resp distress EYES: Pupils equal, round, reactive to light.  No scleral icterus.  MOUTH: Moist mucosal membrane. INTUBATED NECK: Supple.  PULMONARY: Lungs clear to auscultation, +rhonchi, +wheezing CARDIOVASCULAR: S1 and S2.  Regular rate and rhythm GASTROINTESTINAL: Soft, nontender, -distended. Positive bowel sounds.  EXTREMITIES:Right lower extremity: Significant uniform swelling and exquisite  tenderness to palpation. Subtle erythema noted particularly over the medial inferior aspect where is skin breakdown with serous drainage. Erythema extends upwards. Increased warmth to touch  Capillary refill < 3 seconds in all extremities. Pulses palpable distally.  SKIN:see below    Labs/imaging that I havepersonally reviewed  (right click and "Reselect all SmartList Selections" daily)     Labs   CBC: Recent Labs  Lab 05-14-2023 0421 05-14-2023 2250 05/02/23 0404 05/02/23 1318 05/02/23 2109 05/03/23 0545 04/28/2023 0410  WBC 9.4   < > 8.6 8.0 8.3 9.0 8.8  NEUTROABS 8.8*  --   --  7.7  --   --   --   HGB 11.7*   < > 10.9* 10.6* 10.6* 10.5* 9.9*  HCT 36.0*   < > 33.2* 32.0* 31.3* 30.7* 30.4*  MCV 101.7*   < > 98.8 97.6 95.7 96.2 98.4  PLT 77*   < > 63* 53* 53* 72* 44*   < > = values in this interval not displayed.    Basic Metabolic Panel: Recent Labs  Lab 05/02/23 0404 05/02/23 1603 05/03/23 0545 05/03/23 1605 05/18/2023 0410  NA 134* 135 138 137 137  K 4.0 3.9 4.0 3.9 4.1  CL 96* 99 102 99 101  CO2 23 22 22  21* 22  GLUCOSE 152* 146* 130* 122* 113*  BUN 42* 30* 20 17 15   CREATININE 3.35* 2.20* 1.44* 1.10 1.02  CALCIUM 8.4* 8.6* 8.4* 8.6* 8.4*  MG 2.2  --  2.5*  --  2.5*  PHOS 4.3  3.5 2.9 2.2* 2.3*   GFR: Estimated Creatinine Clearance: 78.4 mL/min (by C-G formula based on SCr of 1.02 mg/dL). Recent Labs  Lab 04/28/2023 0414 04/30/2023 0421 05/22/2023 1254 05/02/2023 2250 05/02/23 0404 05/02/23 1318 05/02/23 1602 05/02/23 2109 05/03/23 0545 2023-05-13 0410  PROCALCITON 79.84  --   --   --   --   --  48.67  --  31.61  --   WBC  --    < >  --    < > 8.6 8.0  --  8.3 9.0 8.8  LATICACIDVEN  --    < > 2.2*  --  2.3* 2.8*  --   --  3.0*  --    < > = values in this interval not displayed.    Liver Function Tests: Recent Labs  Lab 05/16/2023 0414 05/08/2023 1547 05/02/23 0404 05/02/23 1603 05/03/23 0545 05/03/23 1605 2023/05/13 0410  AST 1,892*  --  2,233*  --  1,459*   --  1,188*  ALT 808*  --  877*  --  614*  --  542*  ALKPHOS 403*  --  412*  --  401*  --  378*  BILITOT 3.0*  --  3.1*  --  3.4*  --  4.0*  PROT 5.7*  --  5.8*  --  5.8*  --  5.6*  ALBUMIN 2.3*   < > 2.2*  2.2* 2.2* 2.2*  2.2* 2.5* 2.4*  2.4*   < > = values in this interval not displayed.   No results for input(s): "LIPASE", "AMYLASE" in the last 168 hours. Recent Labs  Lab 05/03/23 0952  AMMONIA 30    ABG    Component Value Date/Time   HCO3 17.6 (L) 05/20/2023 0325   ACIDBASEDEF 7.8 (H) 05/13/2023 0325   O2SAT 52.3 04/30/2023 0325     Coagulation Profile: Recent Labs  Lab 05/14/2023 0331 05/02/23 0404 05/03/23 0545 May 13, 2023 0410  INR 3.7* 3.3* 3.1* 2.9*    Cardiac Enzymes: No results for input(s): "CKTOTAL", "CKMB", "CKMBINDEX", "TROPONINI" in the last 168 hours.  HbA1C: Hgb A1c MFr Bld  Date/Time Value Ref Range Status  05/22/2023 04:21 AM 6.1 (H) 4.8 - 5.6 % Final    Comment:    (NOTE) Pre diabetes:          5.7%-6.4%  Diabetes:              >6.4%  Glycemic control for   <7.0% adults with diabetes   11/27/2021 05:59 AM 5.6 4.8 - 5.6 % Final    Comment:    (NOTE) Pre diabetes:          5.7%-6.4%  Diabetes:              >6.4%  Glycemic control for   <7.0% adults with diabetes     CBG: Recent Labs  Lab 05/03/23 1205 05/03/23 1610 05/03/23 2003 05/13/2023 0020 2023-05-13 0412  GLUCAP 122* 114* 106* 110* 101*   Assessment & Plan:  Admitted for  septic shock due to  Right Leg Cellulitis with acute metabolic encephalopathy with acute liver failure in the setting metastatic cancer  RESP DISTRESS HIGH CHANCES OF DEATH Oxygen as needed   SEPTIC shock SOURCE-RT LEG, liver mets -use vasopressors to keep MAP>65 as needed -follow ABG and LA as needed -follow up cultures -emperic ABX -stress dose steroids     ESRD ON CRRT -continue Foley Catheter-assess need -Avoid nephrotoxic agents -Follow urine output, BMP -Ensure adequate renal  perfusion, optimize oxygenation -  Renal dose medications   Intake/Output Summary (Last 24 hours) at 05/03/2023 0708 Last data filed at 05/03/2023 0700 Gross per 24 hour  Intake 1591.31 ml  Output 1653 ml  Net -61.69 ml     ACUTE LIVER FAILURE Likely multifactorial in the setting of  liver mets, hepatorenal and severe sepsis as above -CT multiple lesions throughout the liver concerning for mets -Continue to trend -Avoid hepatotoxic This is terminal condition  NEUROLOGY -CTH neg -Likely multifactorial in the setting of multiple medical comorbidities including sepsis. 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 Ischemic Cardiomyopathy s/p CABG x 3 most recent  04/04/2023 Can NOT start AC due to high risk of bleeding    Hx of prostate cancer with mets to lymph nodes and bones -Oncology following   ENDO - ICU hypoglycemic\Hyperglycemia protocol -check FSBS per protocol   GI GI PROPHYLAXIS as indicated  NUTRITIONAL STATUS DIET-->NPO Constipation protocol as indicated   ELECTROLYTES -follow labs as needed -replace as needed -pharmacy consultation and following    Best practice:  Diet:  NPO Pain/Anxiety/Delirium protocol (if indicated): No VAP protocol (if indicated): Not indicated DVT prophylaxis: Subcutaneous Heparin GI prophylaxis: N/A Glucose control:  SSI Yes Central venous access:  N/A Arterial line:  N/A Foley:  N/A Mobility:  bed rest  PT consulted: N/A Last date of multidisciplinary goals of care discussion [no family at bedside] Code Status:  DNR/DNI Disposition: ICU     DVT/GI PRX  assessed I Assessed the need for Labs I Assessed the need for Foley I Assessed the need for Central Venous Line Family Discussion when available I Assessed the need for Mobilization I made an Assessment of medications to be adjusted accordingly Safety Risk assessment  completed  CASE DISCUSSED IN MULTIDISCIPLINARY ROUNDS WITH ICU TEAM     Critical Care Time devoted to patient care services described in this note is 60 minutes.  Critical care was necessary to treat /prevent imminent and life-threatening deterioration. Overall, patient is critically ill, prognosis is guarded.  Patient with Multiorgan failure and at high risk for cardiac arrest and death.    Lucie Leather, M.D.  [facility]  Medical Director [facility] Medical Director [facility] Cardio[facility] Department

## 2023-05-23 NOTE — Death Summary Note (Signed)
colon. The appendix is normal. Reproductive: Prostate is unremarkable. Lymphatic: Multiple enlarged lymph nodes are noted in the right inguinal soft tissues and along the right external iliac chain. For example, measuring up to 1.8 cm in the short axis along the right external iliac chain. Additionally, there are borderline enlarged porta hepatis lymph nodes, for example measuring up to 1.0 cm in the short axis. Vasculature: The abdominal aorta is normal in caliber. Scattered atherosclerotic calcifications. Other: No abdominopelvic ascites. Musculoskeletal: No aggressive osseous lesions. Similar chronic compression deformities are noted involving the T10 and L2 vertebral bodies. The soft tissues are unremarkable. IMPRESSION: 1. There are new multiple hypodense lesions scattered throughout the liver, incompletely characterized on this noncontrasted exam. Borderline enlarged lymph nodes  are also present in the porta hepatis. Findings raise concern for neoplastic process. Further evaluation with contrast-enhanced CT or MRI liver protocol is recommended. 2. There are new and asymmetrically enlarged right inguinal and external iliac chain lymph nodes, indeterminate but suspicious for underlying pathology given concomitant findings in the liver. These are amenable for image guided biopsy if clinically desired. 3. Colonic diverticulosis without active inflammatory changes. 4. Atrophic appearance of the pancreas with calcifications, presumed sequela of chronic pancreatitis. Electronically Signed   By: Olive Bass M.D.   On: 04/11/2023 11:38    Microbiology Recent Results (from the past 240 hour(s))  Resp panel by RT-PCR (RSV, Flu A&B, Covid) Anterior Nasal Swab     Status: None   Collection Time: 05/07/2023  3:31 AM   Specimen: Anterior Nasal Swab  Result Value Ref Range Status   SARS Coronavirus 2 by RT PCR NEGATIVE NEGATIVE Final    Comment: (NOTE) SARS-CoV-2 target nucleic acids are NOT DETECTED.  The SARS-CoV-2 RNA is generally detectable in upper respiratory specimens during the acute phase of infection. The lowest concentration of SARS-CoV-2 viral copies this assay can detect is 138 copies/mL. A negative result does not preclude SARS-Cov-2 infection and should not be used as the sole basis for treatment or other patient management decisions. A negative result may occur with  improper specimen collection/handling, submission of specimen other than nasopharyngeal swab, presence of viral mutation(s) within the areas targeted by this assay, and inadequate number of viral copies(<138 copies/mL). A negative result must be combined with clinical observations, patient history, and epidemiological information. The expected result is Negative.  Fact Sheet for Patients:  BloggerCourse.com  Fact Sheet for Healthcare Providers:   SeriousBroker.it  This test is no t yet approved or cleared by the [place] FDA and  has been authorized for detection and/or diagnosis of SARS-CoV-2 by FDA under an Emergency Use Authorization (EUA). This EUA will remain  in effect (meaning this test can be used) for the duration of the COVID-19 declaration under Section 564(b)(1) of the Act, 21 U.S.C.section 360bbb-3(b)(1), unless the authorization is terminated  or revoked sooner.       Influenza A by PCR NEGATIVE NEGATIVE Final   Influenza B by PCR NEGATIVE NEGATIVE Final    Comment: (NOTE) The Xpert Xpress SARS-CoV-2/FLU/RSV plus assay is intended as an aid in the diagnosis of influenza from Nasopharyngeal swab specimens and should not be used as a sole basis for treatment. Nasal washings and aspirates are unacceptable for Xpert Xpress SARS-CoV-2/FLU/RSV testing.  Fact Sheet for Patients: BloggerCourse.com  Fact Sheet for Healthcare Providers: SeriousBroker.it  This test is not yet approved or cleared by the [place] FDA and has been authorized for detection and/or diagnosis of SARS-CoV-2 by FDA under  [address] [place], [place] [address]  C Difficile Quick Screen w PCR reflex     Status: None   Collection Time: 04/25/2023  3:34 AM   Specimen: STOOL  Result Value Ref Range Status   C Diff antigen NEGATIVE NEGATIVE Final   C Diff toxin NEGATIVE NEGATIVE Final   C Diff interpretation No C. difficile detected.  Final    Comment: Performed at [facility], [address] [place], [place] [address]  MRSA Next Gen by PCR, Nasal     Status: None   Collection Time: 05/08/2023 10:56 AM   Specimen: Nasal Mucosa; Nasal Swab  Result Value Ref Range Status   MRSA by PCR Next Gen NOT DETECTED NOT DETECTED Final    Comment: (NOTE) The GeneXpert MRSA Assay (FDA approved for NASAL specimens only), is one component of a comprehensive MRSA colonization surveillance program. It is not intended to diagnose MRSA infection nor to guide or monitor treatment for MRSA infections. Test performance is not FDA approved in patients less than 21 years old. Performed at [facility], [address] [place], [place] [address]     Lab Basic Metabolic Panel: Recent Labs  Lab 05/02/23 0404 05/02/23 1603 05/03/23 0545 05/03/23 1605 05/05/2023 0410  NA 134* 135 138 137 137  K 4.0 3.9 4.0 3.9 4.1  CL 96* 99 102  99 101  CO2 23 22 22  21* 22  GLUCOSE 152* 146* 130* 122* 113*  BUN 42* 30* 20 17 15   CREATININE 3.35* 2.20* 1.44* 1.10 1.02  CALCIUM 8.4* 8.6* 8.4* 8.6* 8.4*  MG 2.2  --  2.5*  --  2.5*  PHOS 4.3 3.5 2.9 2.2* 2.3*   Liver Function Tests: Recent Labs  Lab 05/20/2023 0414 05/09/2023 1547 05/02/23 0404 05/02/23 1603 05/03/23 0545 05/03/23 1605 04/29/2023 0410  AST 1,892*  --  2,233*  --  1,459*  --  1,188*  ALT 808*  --  877*  --  614*  --  542*  ALKPHOS 403*  --  412*  --  401*  --  378*  BILITOT 3.0*  --  3.1*  --  3.4*  --  4.0*  PROT 5.7*  --  5.8*  --  5.8*  --  5.6*  ALBUMIN 2.3*   < > 2.2*  2.2* 2.2* 2.2*  2.2* 2.5* 2.4*  2.4*   < > = values in this interval not displayed.   No results for input(s): "LIPASE", "AMYLASE" in the last 168 hours. Recent Labs  Lab 05/03/23 0952  AMMONIA 30   CBC: Recent Labs  Lab 05/05/2023 0421 05/06/2023 2250 05/02/23 0404 05/02/23 1318 05/02/23 2109 05/03/23 0545 04/27/2023 0410  WBC 9.4   < > 8.6 8.0 8.3 9.0 8.8  NEUTROABS 8.8*  --   --  7.7  --   --   --   HGB 11.7*   < > 10.9* 10.6* 10.6* 10.5* 9.9*  HCT 36.0*   < > 33.2* 32.0* 31.3* 30.7* 30.4*  MCV 101.7*   < > 98.8 97.6 95.7 96.2 98.4  PLT 77*   < > 63* 53* 53* 72* 44*   < > = values in this interval not displayed.   Cardiac Enzymes: No results for input(s): "CKTOTAL", "CKMB", "CKMBINDEX", "TROPONINI" in the last 168 hours. Sepsis Labs: Recent Labs  Lab 05/17/2023 0414 05/07/2023 0421 05/22/2023 1254 04/26/2023 2250 05/02/23 0404 05/02/23 1318 05/02/23 1602 05/02/23 2109 05/03/23 0545 05/10/2023 0410  PROCALCITON 79.84  --   --   --   --   --  48.67  --  31.61  --   WBC  --    < >  --    < > 8.6 8.0  --  8.3 9.0 8.8  LATICACIDVEN  --    < > 2.2*  --  2.3* 2.8*  --   --  3.0*  --    < > = values in this interval not displayed.    Adi Seales 05/10/2023, 10:20 AM  48.67  --  31.61  --   WBC  --    < >  --    < > 8.6 8.0  --  8.3 9.0 8.8  LATICACIDVEN  --    < > 2.2*  --  2.3* 2.8*  --   --  3.0*  --    < > = values in this interval not displayed.    Adi Seales 05/10/2023, 10:20 AM  DEATH SUMMARY   Patient Details  Name: Micheal Aguirre MRN: [id number] DOB: 11-01-1955  Admission/Discharge Information   Admit Date:  May 13, 2023  Date of Death:  2023-05-16   Time of Death:  1003AM  Length of Stay: 3  Referring Physician: Housecalls, Doctors Making   Reason(s) for Hospitalization  LIVER FAILURE, METASTATIC CANCER  Diagnoses  Preliminary cause of death: METASTATIC CANCER, LIVER FAILURE, SEPTIC SHOCK Secondary Diagnoses (including complications and co-morbidities):  Principal Problem:   Severe sepsis with acute organ dysfunction [facility])   Brief Hospital Course (including significant findings, care, treatment, and services provided and events leading to death)  67 y.o. 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   On review of chart patient was recently admitted from 04/17/2023 to 04/25/2023 with cellulitis of right leg for which she completed 8 days of IV antibiotics and was discharged on Keflex for additional 5 days.    ED Course: Initial vital signs showed HR of beats/minute, BP 117/81 mm Hg, the RR 17 breaths/minute, and the oxygen saturation 98% on and a temperature of 95.29F (35.2C).  Na+/ K+: 130/7.4 Glucose: 182 BUN/Cr.:75/6.68 Calcium: 7.1 AST/ALT:808/403, CO2 18, Anion Gap 22 WBC: 9.4 Hgb/Hct: 11.7/36.0 Plts: 77 PCT:79.84 Lactic acid: 4.8 COVID PCR: Negative,  VBG: pO2 34; pCO2 35; pH 7.31;  HCO3 17.6, %O2 Sat 52.3.  CXR> CTH> CTA Chest> CT Abd/pelvis>see below   Patient given 30 cc/kg of fluids and started on broad-spectrum antibiotics Vanco cefepime and Flagyl for sepsis with septic shock. Patient remained hypotensive despite IVF boluses  therefore was started on Levophed.  (Sepsis reassessment completed). PCCM consulted.   SEVERE MULTIORGAN FAILURE AND IN DYING PROCESS  Significant Hospital Events   9/9:Admit with metabolic encephalopathy in the setting of severe sepsis/uremia 2023/05/13 family discussion LIMITED CODE 05/13/2023 CT scans of RT leg c/w cellulitis 9/10 remains metabolic encephalopathy in the setting of severe sepsis/uremia LIVER FAILURE 9/11 remains metabolic encephalopathy in the setting of severe sepsis/uremia LIVER FAILURE 05-16-23 remains metabolic encephalopathy in the setting of severe sepsis/uremia LIVER FAILURE   GOALS OF CARE DISCUSSION   The Clinical status was relayed to family in detail-   Updated and notified of patients medical condition- Patient remains unresponsive and will not open eyes to command.   Patient is having a weak cough and struggling to remove secretions.   Patient with increased WOB and using accessory muscles to breathe Explained to family course of therapy and the modalities    Patient with Progressive multiorgan failure with a very high probablity of a very minimal chance of meaningful recovery despite all aggressive and optimal medical therapy.  PATIENT  DNR/DNI   Family understands the situation. Progressive Liver failure and damage due to cancer Leading to multiorgan failure     Family are satisfied with Plan of action and management. All questions answered Patient passed away, daughter and ex-wife at bedside  Pertinent Labs and Studies  Significant Diagnostic Studies CT TIBIA FIBULA RIGHT WO CONTRAST  Result Date: 05-13-23 CLINICAL DATA:  Soft tissue infection suspected. History of right femur fracture with ORIF 02/03/2022. EXAM: CT OF THE RIGHT TIBIA AND FIBULA WITHOUT CONTRAST CT OF THE RIGHT FOOT WITHOUT CONTRAST TECHNIQUE: Multidetector CT imaging of the right lower leg  DEATH SUMMARY   Patient Details  Name: Micheal Aguirre MRN: [id number] DOB: 11-01-1955  Admission/Discharge Information   Admit Date:  May 13, 2023  Date of Death:  2023-05-16   Time of Death:  1003AM  Length of Stay: 3  Referring Physician: Housecalls, Doctors Making   Reason(s) for Hospitalization  LIVER FAILURE, METASTATIC CANCER  Diagnoses  Preliminary cause of death: METASTATIC CANCER, LIVER FAILURE, SEPTIC SHOCK Secondary Diagnoses (including complications and co-morbidities):  Principal Problem:   Severe sepsis with acute organ dysfunction [facility])   Brief Hospital Course (including significant findings, care, treatment, and services provided and events leading to death)  67 y.o. 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   On review of chart patient was recently admitted from 04/17/2023 to 04/25/2023 with cellulitis of right leg for which she completed 8 days of IV antibiotics and was discharged on Keflex for additional 5 days.    ED Course: Initial vital signs showed HR of beats/minute, BP 117/81 mm Hg, the RR 17 breaths/minute, and the oxygen saturation 98% on and a temperature of 95.29F (35.2C).  Na+/ K+: 130/7.4 Glucose: 182 BUN/Cr.:75/6.68 Calcium: 7.1 AST/ALT:808/403, CO2 18, Anion Gap 22 WBC: 9.4 Hgb/Hct: 11.7/36.0 Plts: 77 PCT:79.84 Lactic acid: 4.8 COVID PCR: Negative,  VBG: pO2 34; pCO2 35; pH 7.31;  HCO3 17.6, %O2 Sat 52.3.  CXR> CTH> CTA Chest> CT Abd/pelvis>see below   Patient given 30 cc/kg of fluids and started on broad-spectrum antibiotics Vanco cefepime and Flagyl for sepsis with septic shock. Patient remained hypotensive despite IVF boluses  therefore was started on Levophed.  (Sepsis reassessment completed). PCCM consulted.   SEVERE MULTIORGAN FAILURE AND IN DYING PROCESS  Significant Hospital Events   9/9:Admit with metabolic encephalopathy in the setting of severe sepsis/uremia 2023/05/13 family discussion LIMITED CODE 05/13/2023 CT scans of RT leg c/w cellulitis 9/10 remains metabolic encephalopathy in the setting of severe sepsis/uremia LIVER FAILURE 9/11 remains metabolic encephalopathy in the setting of severe sepsis/uremia LIVER FAILURE 05-16-23 remains metabolic encephalopathy in the setting of severe sepsis/uremia LIVER FAILURE   GOALS OF CARE DISCUSSION   The Clinical status was relayed to family in detail-   Updated and notified of patients medical condition- Patient remains unresponsive and will not open eyes to command.   Patient is having a weak cough and struggling to remove secretions.   Patient with increased WOB and using accessory muscles to breathe Explained to family course of therapy and the modalities    Patient with Progressive multiorgan failure with a very high probablity of a very minimal chance of meaningful recovery despite all aggressive and optimal medical therapy.  PATIENT  DNR/DNI   Family understands the situation. Progressive Liver failure and damage due to cancer Leading to multiorgan failure     Family are satisfied with Plan of action and management. All questions answered Patient passed away, daughter and ex-wife at bedside  Pertinent Labs and Studies  Significant Diagnostic Studies CT TIBIA FIBULA RIGHT WO CONTRAST  Result Date: 05-13-23 CLINICAL DATA:  Soft tissue infection suspected. History of right femur fracture with ORIF 02/03/2022. EXAM: CT OF THE RIGHT TIBIA AND FIBULA WITHOUT CONTRAST CT OF THE RIGHT FOOT WITHOUT CONTRAST TECHNIQUE: Multidetector CT imaging of the right lower leg  colon. The appendix is normal. Reproductive: Prostate is unremarkable. Lymphatic: Multiple enlarged lymph nodes are noted in the right inguinal soft tissues and along the right external iliac chain. For example, measuring up to 1.8 cm in the short axis along the right external iliac chain. Additionally, there are borderline enlarged porta hepatis lymph nodes, for example measuring up to 1.0 cm in the short axis. Vasculature: The abdominal aorta is normal in caliber. Scattered atherosclerotic calcifications. Other: No abdominopelvic ascites. Musculoskeletal: No aggressive osseous lesions. Similar chronic compression deformities are noted involving the T10 and L2 vertebral bodies. The soft tissues are unremarkable. IMPRESSION: 1. There are new multiple hypodense lesions scattered throughout the liver, incompletely characterized on this noncontrasted exam. Borderline enlarged lymph nodes  are also present in the porta hepatis. Findings raise concern for neoplastic process. Further evaluation with contrast-enhanced CT or MRI liver protocol is recommended. 2. There are new and asymmetrically enlarged right inguinal and external iliac chain lymph nodes, indeterminate but suspicious for underlying pathology given concomitant findings in the liver. These are amenable for image guided biopsy if clinically desired. 3. Colonic diverticulosis without active inflammatory changes. 4. Atrophic appearance of the pancreas with calcifications, presumed sequela of chronic pancreatitis. Electronically Signed   By: Olive Bass M.D.   On: 04/11/2023 11:38    Microbiology Recent Results (from the past 240 hour(s))  Resp panel by RT-PCR (RSV, Flu A&B, Covid) Anterior Nasal Swab     Status: None   Collection Time: 05/07/2023  3:31 AM   Specimen: Anterior Nasal Swab  Result Value Ref Range Status   SARS Coronavirus 2 by RT PCR NEGATIVE NEGATIVE Final    Comment: (NOTE) SARS-CoV-2 target nucleic acids are NOT DETECTED.  The SARS-CoV-2 RNA is generally detectable in upper respiratory specimens during the acute phase of infection. The lowest concentration of SARS-CoV-2 viral copies this assay can detect is 138 copies/mL. A negative result does not preclude SARS-Cov-2 infection and should not be used as the sole basis for treatment or other patient management decisions. A negative result may occur with  improper specimen collection/handling, submission of specimen other than nasopharyngeal swab, presence of viral mutation(s) within the areas targeted by this assay, and inadequate number of viral copies(<138 copies/mL). A negative result must be combined with clinical observations, patient history, and epidemiological information. The expected result is Negative.  Fact Sheet for Patients:  BloggerCourse.com  Fact Sheet for Healthcare Providers:   SeriousBroker.it  This test is no t yet approved or cleared by the [place] FDA and  has been authorized for detection and/or diagnosis of SARS-CoV-2 by FDA under an Emergency Use Authorization (EUA). This EUA will remain  in effect (meaning this test can be used) for the duration of the COVID-19 declaration under Section 564(b)(1) of the Act, 21 U.S.C.section 360bbb-3(b)(1), unless the authorization is terminated  or revoked sooner.       Influenza A by PCR NEGATIVE NEGATIVE Final   Influenza B by PCR NEGATIVE NEGATIVE Final    Comment: (NOTE) The Xpert Xpress SARS-CoV-2/FLU/RSV plus assay is intended as an aid in the diagnosis of influenza from Nasopharyngeal swab specimens and should not be used as a sole basis for treatment. Nasal washings and aspirates are unacceptable for Xpert Xpress SARS-CoV-2/FLU/RSV testing.  Fact Sheet for Patients: BloggerCourse.com  Fact Sheet for Healthcare Providers: SeriousBroker.it  This test is not yet approved or cleared by the [place] FDA and has been authorized for detection and/or diagnosis of SARS-CoV-2 by FDA under  [address] [place], [place] [address]  C Difficile Quick Screen w PCR reflex     Status: None   Collection Time: 04/25/2023  3:34 AM   Specimen: STOOL  Result Value Ref Range Status   C Diff antigen NEGATIVE NEGATIVE Final   C Diff toxin NEGATIVE NEGATIVE Final   C Diff interpretation No C. difficile detected.  Final    Comment: Performed at [facility], [address] [place], [place] [address]  MRSA Next Gen by PCR, Nasal     Status: None   Collection Time: 05/08/2023 10:56 AM   Specimen: Nasal Mucosa; Nasal Swab  Result Value Ref Range Status   MRSA by PCR Next Gen NOT DETECTED NOT DETECTED Final    Comment: (NOTE) The GeneXpert MRSA Assay (FDA approved for NASAL specimens only), is one component of a comprehensive MRSA colonization surveillance program. It is not intended to diagnose MRSA infection nor to guide or monitor treatment for MRSA infections. Test performance is not FDA approved in patients less than 21 years old. Performed at [facility], [address] [place], [place] [address]     Lab Basic Metabolic Panel: Recent Labs  Lab 05/02/23 0404 05/02/23 1603 05/03/23 0545 05/03/23 1605 05/05/2023 0410  NA 134* 135 138 137 137  K 4.0 3.9 4.0 3.9 4.1  CL 96* 99 102  99 101  CO2 23 22 22  21* 22  GLUCOSE 152* 146* 130* 122* 113*  BUN 42* 30* 20 17 15   CREATININE 3.35* 2.20* 1.44* 1.10 1.02  CALCIUM 8.4* 8.6* 8.4* 8.6* 8.4*  MG 2.2  --  2.5*  --  2.5*  PHOS 4.3 3.5 2.9 2.2* 2.3*   Liver Function Tests: Recent Labs  Lab 05/20/2023 0414 05/09/2023 1547 05/02/23 0404 05/02/23 1603 05/03/23 0545 05/03/23 1605 04/29/2023 0410  AST 1,892*  --  2,233*  --  1,459*  --  1,188*  ALT 808*  --  877*  --  614*  --  542*  ALKPHOS 403*  --  412*  --  401*  --  378*  BILITOT 3.0*  --  3.1*  --  3.4*  --  4.0*  PROT 5.7*  --  5.8*  --  5.8*  --  5.6*  ALBUMIN 2.3*   < > 2.2*  2.2* 2.2* 2.2*  2.2* 2.5* 2.4*  2.4*   < > = values in this interval not displayed.   No results for input(s): "LIPASE", "AMYLASE" in the last 168 hours. Recent Labs  Lab 05/03/23 0952  AMMONIA 30   CBC: Recent Labs  Lab 05/05/2023 0421 05/06/2023 2250 05/02/23 0404 05/02/23 1318 05/02/23 2109 05/03/23 0545 04/27/2023 0410  WBC 9.4   < > 8.6 8.0 8.3 9.0 8.8  NEUTROABS 8.8*  --   --  7.7  --   --   --   HGB 11.7*   < > 10.9* 10.6* 10.6* 10.5* 9.9*  HCT 36.0*   < > 33.2* 32.0* 31.3* 30.7* 30.4*  MCV 101.7*   < > 98.8 97.6 95.7 96.2 98.4  PLT 77*   < > 63* 53* 53* 72* 44*   < > = values in this interval not displayed.   Cardiac Enzymes: No results for input(s): "CKTOTAL", "CKMB", "CKMBINDEX", "TROPONINI" in the last 168 hours. Sepsis Labs: Recent Labs  Lab 05/17/2023 0414 05/07/2023 0421 05/22/2023 1254 04/26/2023 2250 05/02/23 0404 05/02/23 1318 05/02/23 1602 05/02/23 2109 05/03/23 0545 05/10/2023 0410  PROCALCITON 79.84  --   --   --   --   --  colon. The appendix is normal. Reproductive: Prostate is unremarkable. Lymphatic: Multiple enlarged lymph nodes are noted in the right inguinal soft tissues and along the right external iliac chain. For example, measuring up to 1.8 cm in the short axis along the right external iliac chain. Additionally, there are borderline enlarged porta hepatis lymph nodes, for example measuring up to 1.0 cm in the short axis. Vasculature: The abdominal aorta is normal in caliber. Scattered atherosclerotic calcifications. Other: No abdominopelvic ascites. Musculoskeletal: No aggressive osseous lesions. Similar chronic compression deformities are noted involving the T10 and L2 vertebral bodies. The soft tissues are unremarkable. IMPRESSION: 1. There are new multiple hypodense lesions scattered throughout the liver, incompletely characterized on this noncontrasted exam. Borderline enlarged lymph nodes  are also present in the porta hepatis. Findings raise concern for neoplastic process. Further evaluation with contrast-enhanced CT or MRI liver protocol is recommended. 2. There are new and asymmetrically enlarged right inguinal and external iliac chain lymph nodes, indeterminate but suspicious for underlying pathology given concomitant findings in the liver. These are amenable for image guided biopsy if clinically desired. 3. Colonic diverticulosis without active inflammatory changes. 4. Atrophic appearance of the pancreas with calcifications, presumed sequela of chronic pancreatitis. Electronically Signed   By: Olive Bass M.D.   On: 04/11/2023 11:38    Microbiology Recent Results (from the past 240 hour(s))  Resp panel by RT-PCR (RSV, Flu A&B, Covid) Anterior Nasal Swab     Status: None   Collection Time: 05/07/2023  3:31 AM   Specimen: Anterior Nasal Swab  Result Value Ref Range Status   SARS Coronavirus 2 by RT PCR NEGATIVE NEGATIVE Final    Comment: (NOTE) SARS-CoV-2 target nucleic acids are NOT DETECTED.  The SARS-CoV-2 RNA is generally detectable in upper respiratory specimens during the acute phase of infection. The lowest concentration of SARS-CoV-2 viral copies this assay can detect is 138 copies/mL. A negative result does not preclude SARS-Cov-2 infection and should not be used as the sole basis for treatment or other patient management decisions. A negative result may occur with  improper specimen collection/handling, submission of specimen other than nasopharyngeal swab, presence of viral mutation(s) within the areas targeted by this assay, and inadequate number of viral copies(<138 copies/mL). A negative result must be combined with clinical observations, patient history, and epidemiological information. The expected result is Negative.  Fact Sheet for Patients:  BloggerCourse.com  Fact Sheet for Healthcare Providers:   SeriousBroker.it  This test is no t yet approved or cleared by the [place] FDA and  has been authorized for detection and/or diagnosis of SARS-CoV-2 by FDA under an Emergency Use Authorization (EUA). This EUA will remain  in effect (meaning this test can be used) for the duration of the COVID-19 declaration under Section 564(b)(1) of the Act, 21 U.S.C.section 360bbb-3(b)(1), unless the authorization is terminated  or revoked sooner.       Influenza A by PCR NEGATIVE NEGATIVE Final   Influenza B by PCR NEGATIVE NEGATIVE Final    Comment: (NOTE) The Xpert Xpress SARS-CoV-2/FLU/RSV plus assay is intended as an aid in the diagnosis of influenza from Nasopharyngeal swab specimens and should not be used as a sole basis for treatment. Nasal washings and aspirates are unacceptable for Xpert Xpress SARS-CoV-2/FLU/RSV testing.  Fact Sheet for Patients: BloggerCourse.com  Fact Sheet for Healthcare Providers: SeriousBroker.it  This test is not yet approved or cleared by the [place] FDA and has been authorized for detection and/or diagnosis of SARS-CoV-2 by FDA under  [address] [place], [place] [address]  C Difficile Quick Screen w PCR reflex     Status: None   Collection Time: 04/25/2023  3:34 AM   Specimen: STOOL  Result Value Ref Range Status   C Diff antigen NEGATIVE NEGATIVE Final   C Diff toxin NEGATIVE NEGATIVE Final   C Diff interpretation No C. difficile detected.  Final    Comment: Performed at [facility], [address] [place], [place] [address]  MRSA Next Gen by PCR, Nasal     Status: None   Collection Time: 05/08/2023 10:56 AM   Specimen: Nasal Mucosa; Nasal Swab  Result Value Ref Range Status   MRSA by PCR Next Gen NOT DETECTED NOT DETECTED Final    Comment: (NOTE) The GeneXpert MRSA Assay (FDA approved for NASAL specimens only), is one component of a comprehensive MRSA colonization surveillance program. It is not intended to diagnose MRSA infection nor to guide or monitor treatment for MRSA infections. Test performance is not FDA approved in patients less than 21 years old. Performed at [facility], [address] [place], [place] [address]     Lab Basic Metabolic Panel: Recent Labs  Lab 05/02/23 0404 05/02/23 1603 05/03/23 0545 05/03/23 1605 05/05/2023 0410  NA 134* 135 138 137 137  K 4.0 3.9 4.0 3.9 4.1  CL 96* 99 102  99 101  CO2 23 22 22  21* 22  GLUCOSE 152* 146* 130* 122* 113*  BUN 42* 30* 20 17 15   CREATININE 3.35* 2.20* 1.44* 1.10 1.02  CALCIUM 8.4* 8.6* 8.4* 8.6* 8.4*  MG 2.2  --  2.5*  --  2.5*  PHOS 4.3 3.5 2.9 2.2* 2.3*   Liver Function Tests: Recent Labs  Lab 05/20/2023 0414 05/09/2023 1547 05/02/23 0404 05/02/23 1603 05/03/23 0545 05/03/23 1605 04/29/2023 0410  AST 1,892*  --  2,233*  --  1,459*  --  1,188*  ALT 808*  --  877*  --  614*  --  542*  ALKPHOS 403*  --  412*  --  401*  --  378*  BILITOT 3.0*  --  3.1*  --  3.4*  --  4.0*  PROT 5.7*  --  5.8*  --  5.8*  --  5.6*  ALBUMIN 2.3*   < > 2.2*  2.2* 2.2* 2.2*  2.2* 2.5* 2.4*  2.4*   < > = values in this interval not displayed.   No results for input(s): "LIPASE", "AMYLASE" in the last 168 hours. Recent Labs  Lab 05/03/23 0952  AMMONIA 30   CBC: Recent Labs  Lab 05/05/2023 0421 05/06/2023 2250 05/02/23 0404 05/02/23 1318 05/02/23 2109 05/03/23 0545 04/27/2023 0410  WBC 9.4   < > 8.6 8.0 8.3 9.0 8.8  NEUTROABS 8.8*  --   --  7.7  --   --   --   HGB 11.7*   < > 10.9* 10.6* 10.6* 10.5* 9.9*  HCT 36.0*   < > 33.2* 32.0* 31.3* 30.7* 30.4*  MCV 101.7*   < > 98.8 97.6 95.7 96.2 98.4  PLT 77*   < > 63* 53* 53* 72* 44*   < > = values in this interval not displayed.   Cardiac Enzymes: No results for input(s): "CKTOTAL", "CKMB", "CKMBINDEX", "TROPONINI" in the last 168 hours. Sepsis Labs: Recent Labs  Lab 05/17/2023 0414 05/07/2023 0421 05/22/2023 1254 04/26/2023 2250 05/02/23 0404 05/02/23 1318 05/02/23 1602 05/02/23 2109 05/03/23 0545 05/10/2023 0410  PROCALCITON 79.84  --   --   --   --   --

## 2023-05-23 NOTE — Plan of Care (Signed)
PMT following. Updated by CCM family should be arriving at 10:00 this morning. Arrived to bedside and patient has expired. No family present in room.

## 2023-05-23 DEATH — deceased

## 2024-03-16 IMAGING — DX DG ANKLE COMPLETE 3+V*R*
3 series · 3 of 3 positions shown · non-contrast
Comparison: None.

CLINICAL DATA: Trauma, pain

EXAM:
RIGHT ANKLE - COMPLETE 3+ VIEW

[ankle ap]
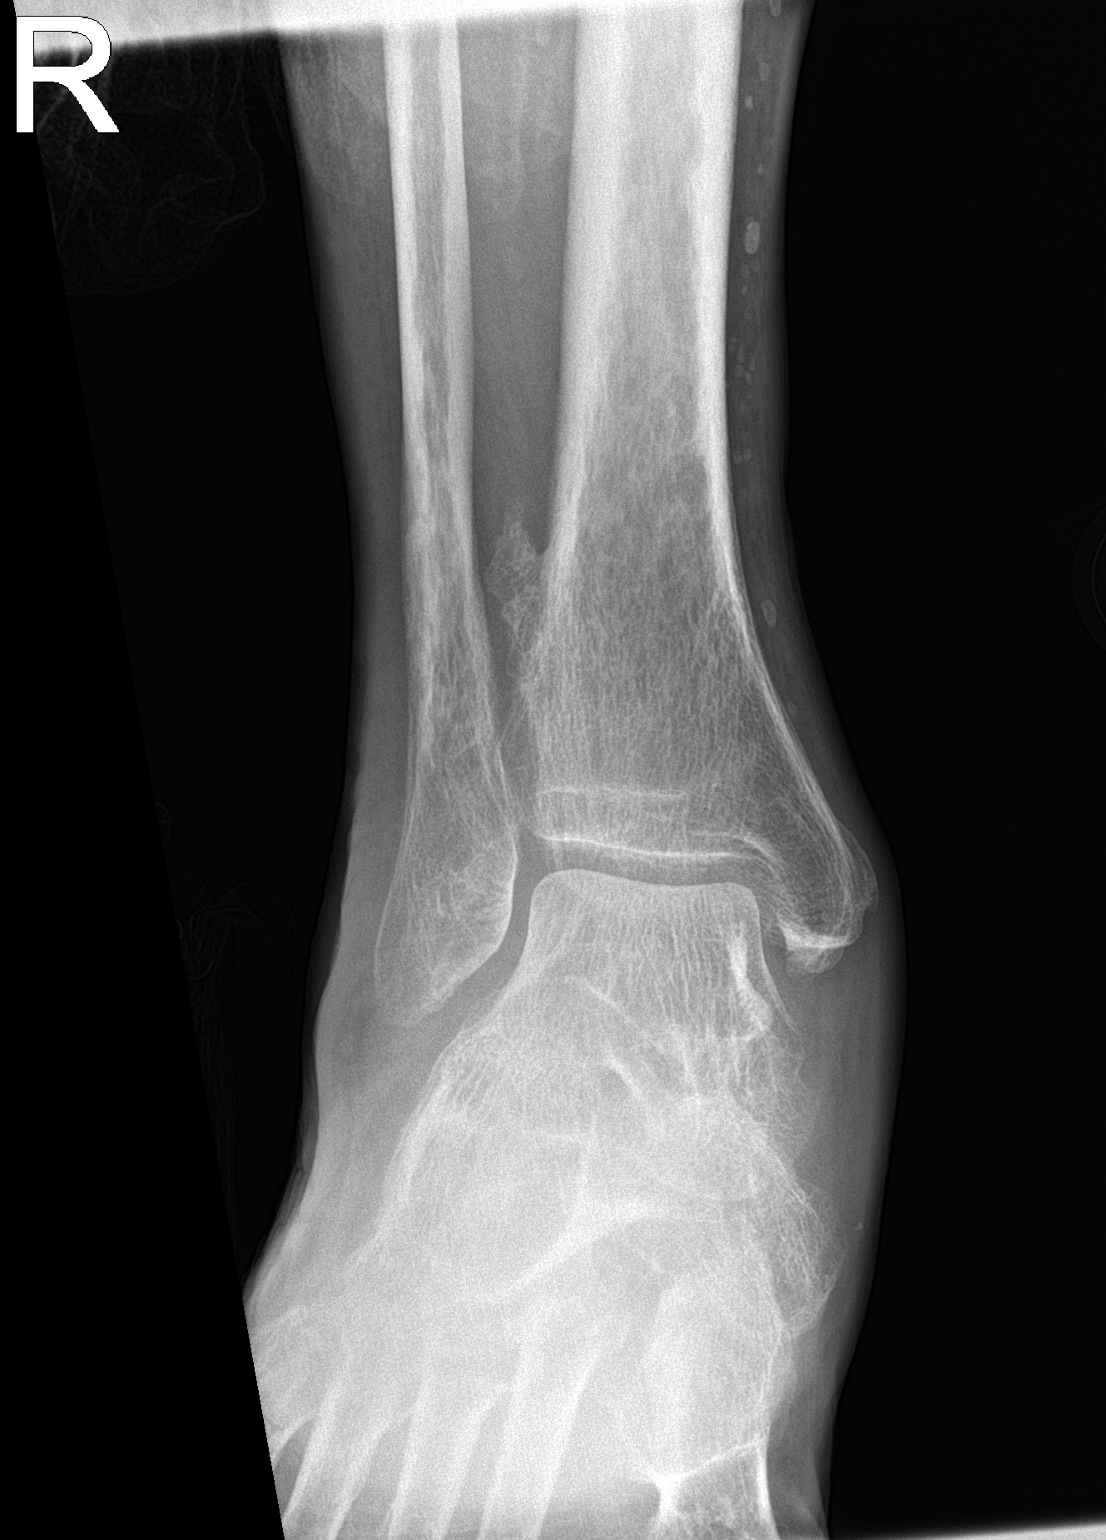

[ankle obl]
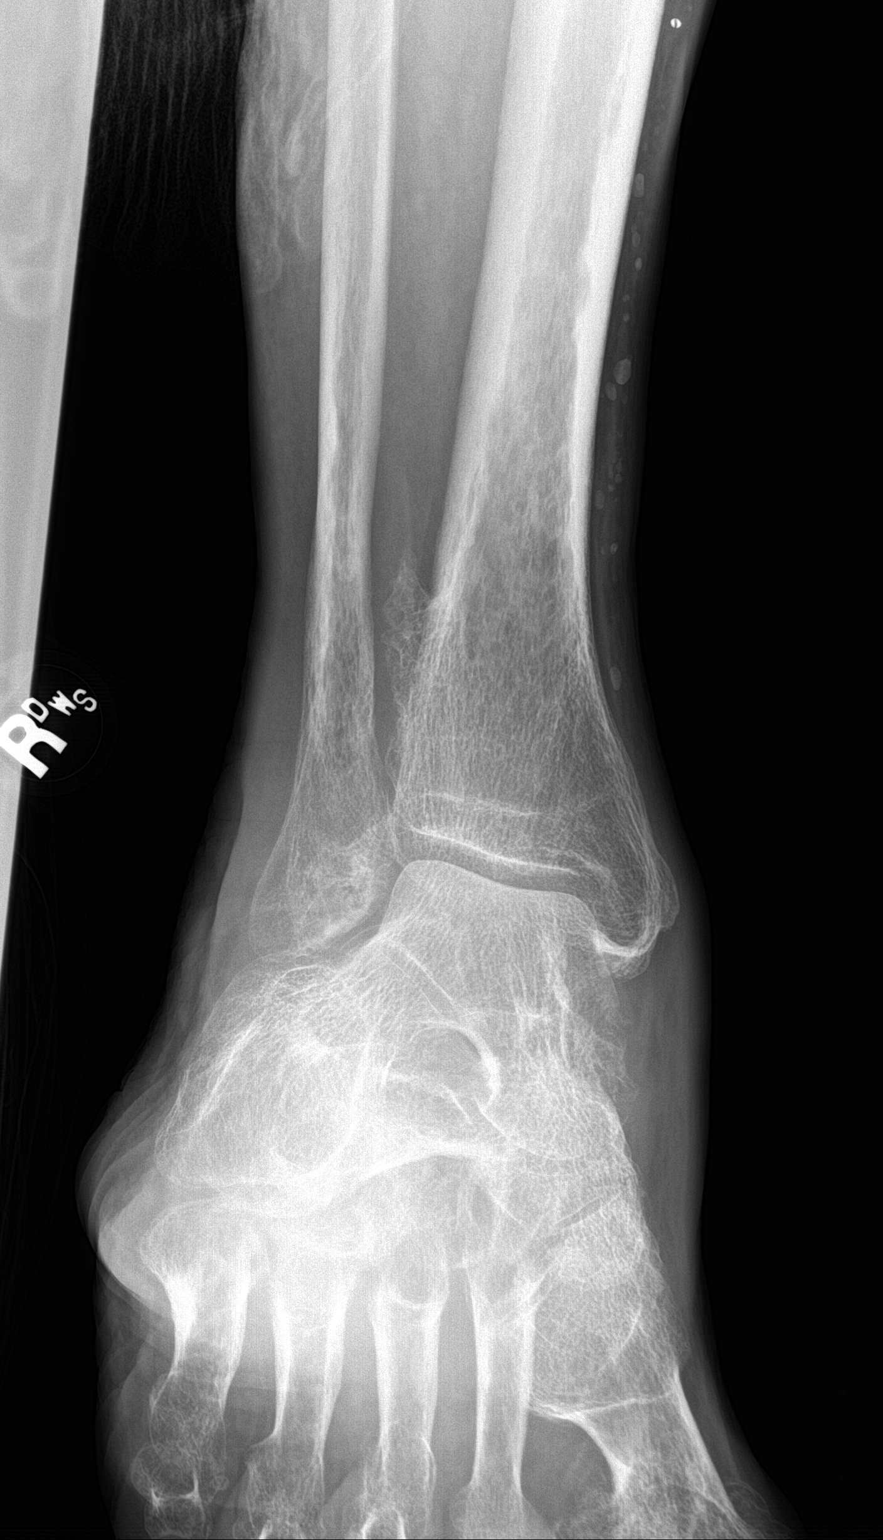

[ankle lat]
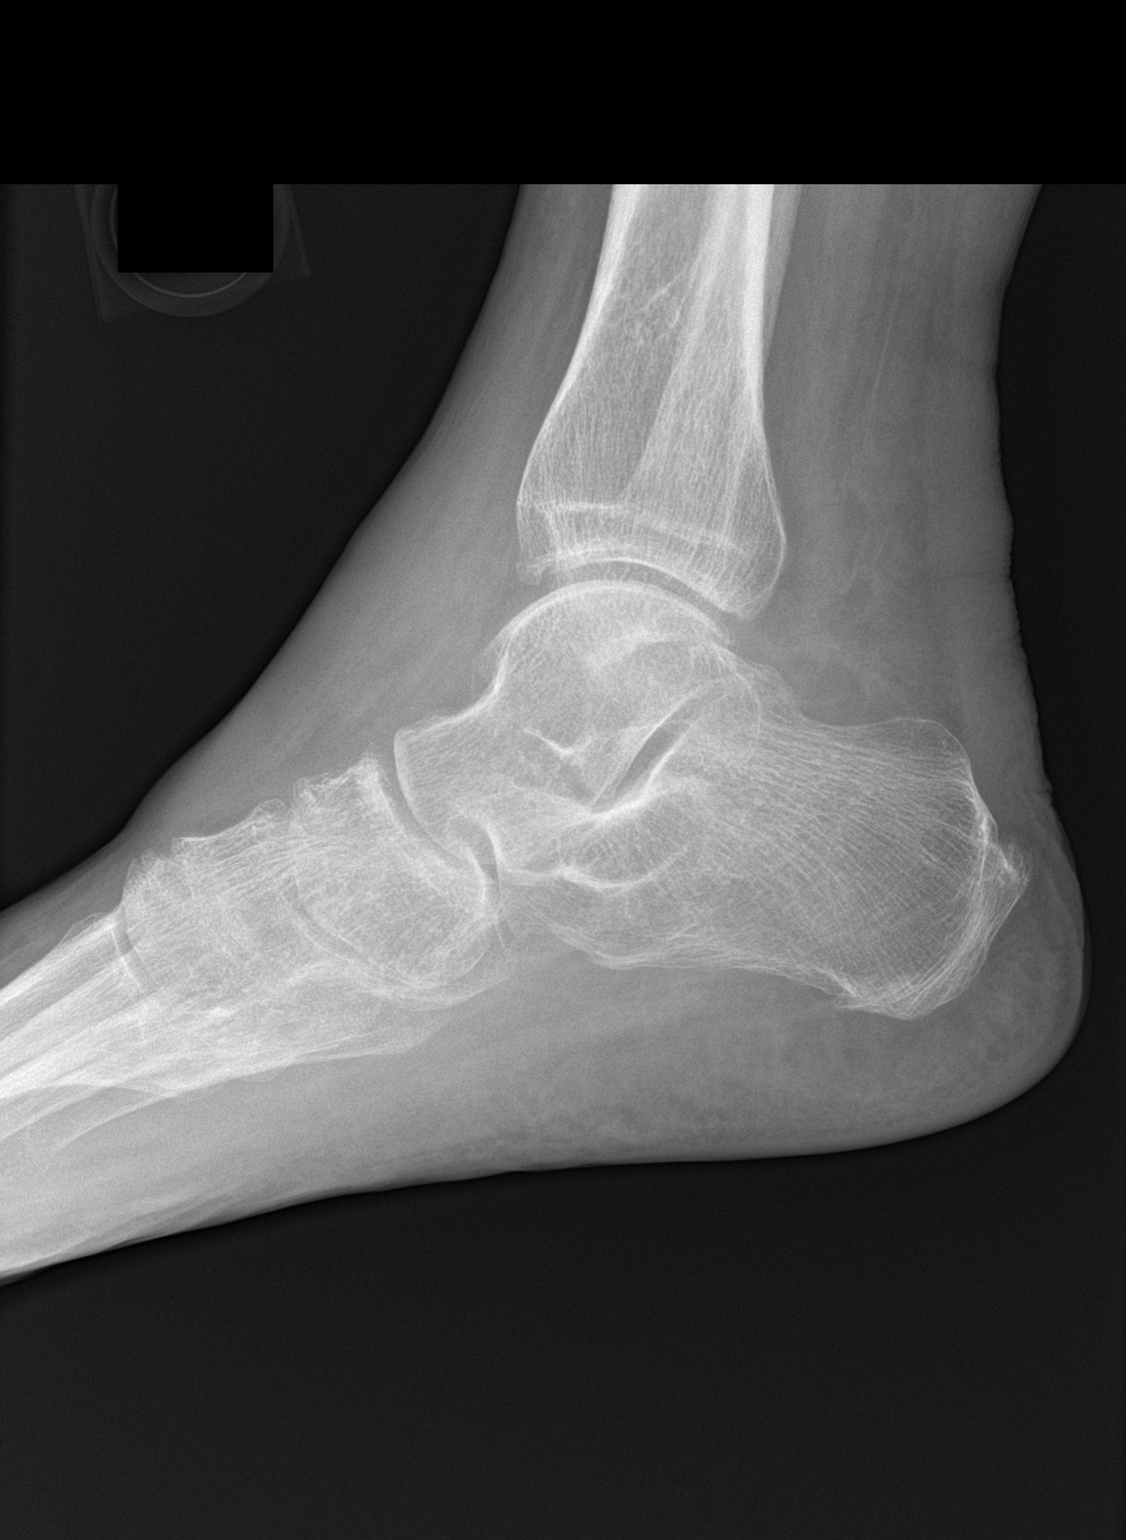

[3 of 3 positions shown; findings below may reference images not displayed]

FINDINGS: No recent fracture or dislocation is seen. Osteopenia is seen in
bony structures. Small bony spurs seen in the distal tibia. Bony
spurs are noted in the dorsal aspect of intertarsal and
tarsometatarsal joints in the lateral view. There is possible tiny
plantar spur in the calcaneus. There is soft tissue swelling around
the ankle, more so over the lateral malleolus.
IMPRESSION: No recent fracture or dislocation is seen in the right ankle.

Degenerative changes are noted in multiple joints as described in
the body of the report. There is possible small plantar spur in the
calcaneus.
